# Patient Record
Sex: Male | Born: 1935
Health system: Southern US, Community
[De-identification: ages and names within clinical notes are randomized; demographics above are authoritative.]

## PROBLEM LIST (undated history)

## (undated) DIAGNOSIS — E785 Hyperlipidemia, unspecified: Secondary | ICD-10-CM

## (undated) DIAGNOSIS — E669 Obesity, unspecified: Secondary | ICD-10-CM

## (undated) DIAGNOSIS — I10 Essential (primary) hypertension: Secondary | ICD-10-CM

## (undated) DIAGNOSIS — I2510 Atherosclerotic heart disease of native coronary artery without angina pectoris: Secondary | ICD-10-CM

## (undated) DIAGNOSIS — E119 Type 2 diabetes mellitus without complications: Secondary | ICD-10-CM

## (undated) HISTORY — DX: Type 2 diabetes mellitus without complications: E11.9

## (undated) HISTORY — DX: Atherosclerotic heart disease of native coronary artery without angina pectoris: I25.10

## (undated) HISTORY — PX: OTHER SURGICAL HISTORY: SHX169

## (undated) HISTORY — PX: BACK SURGERY: SHX140

## (undated) HISTORY — DX: Obesity, unspecified: E66.9

## (undated) HISTORY — PX: BILATERAL KNEE ARTHROSCOPY: SUR91

## (undated) HISTORY — DX: Essential (primary) hypertension: I10

## (undated) HISTORY — DX: Hyperlipidemia, unspecified: E78.5

---

## 1998-02-24 ENCOUNTER — Encounter: Admission: RE | Admit: 1998-02-24 | Discharge: 1998-02-24 | Payer: Self-pay | Admitting: Family Medicine

## 1998-03-08 ENCOUNTER — Encounter: Admission: RE | Admit: 1998-03-08 | Discharge: 1998-03-08 | Payer: Self-pay | Admitting: Sports Medicine

## 1998-05-06 ENCOUNTER — Encounter: Admission: RE | Admit: 1998-05-06 | Discharge: 1998-05-06 | Payer: Self-pay | Admitting: Family Medicine

## 1998-05-10 ENCOUNTER — Encounter: Admission: RE | Admit: 1998-05-10 | Discharge: 1998-05-10 | Payer: Self-pay | Admitting: Family Medicine

## 2000-03-15 ENCOUNTER — Encounter: Admission: RE | Admit: 2000-03-15 | Discharge: 2000-03-15 | Payer: Self-pay | Admitting: Sports Medicine

## 2000-04-09 ENCOUNTER — Ambulatory Visit (HOSPITAL_COMMUNITY): Admission: RE | Admit: 2000-04-09 | Discharge: 2000-04-09 | Payer: Self-pay | Admitting: Sports Medicine

## 2000-10-19 ENCOUNTER — Encounter: Admission: RE | Admit: 2000-10-19 | Discharge: 2000-10-19 | Payer: Self-pay | Admitting: Sports Medicine

## 2001-04-03 ENCOUNTER — Encounter: Admission: RE | Admit: 2001-04-03 | Discharge: 2001-04-03 | Payer: Self-pay | Admitting: Family Medicine

## 2002-04-14 ENCOUNTER — Encounter: Admission: RE | Admit: 2002-04-14 | Discharge: 2002-04-14 | Payer: Self-pay | Admitting: Family Medicine

## 2002-05-13 ENCOUNTER — Encounter: Payer: Self-pay | Admitting: Sports Medicine

## 2002-05-13 ENCOUNTER — Encounter: Admission: RE | Admit: 2002-05-13 | Discharge: 2002-05-13 | Payer: Self-pay | Admitting: Sports Medicine

## 2002-06-18 ENCOUNTER — Encounter (INDEPENDENT_AMBULATORY_CARE_PROVIDER_SITE_OTHER): Payer: Self-pay | Admitting: Specialist

## 2002-06-18 ENCOUNTER — Ambulatory Visit (HOSPITAL_COMMUNITY): Admission: RE | Admit: 2002-06-18 | Discharge: 2002-06-18 | Payer: Self-pay | Admitting: Gastroenterology

## 2002-06-24 ENCOUNTER — Encounter: Admission: RE | Admit: 2002-06-24 | Discharge: 2002-06-24 | Payer: Self-pay | Admitting: Sports Medicine

## 2002-07-15 ENCOUNTER — Encounter: Admission: RE | Admit: 2002-07-15 | Discharge: 2002-07-15 | Payer: Self-pay | Admitting: Sports Medicine

## 2002-09-25 ENCOUNTER — Encounter: Admission: RE | Admit: 2002-09-25 | Discharge: 2002-09-25 | Payer: Self-pay | Admitting: Family Medicine

## 2003-02-17 ENCOUNTER — Encounter: Admission: RE | Admit: 2003-02-17 | Discharge: 2003-02-17 | Payer: Self-pay | Admitting: Sports Medicine

## 2003-02-26 ENCOUNTER — Encounter: Admission: RE | Admit: 2003-02-26 | Discharge: 2003-02-26 | Payer: Self-pay | Admitting: Sports Medicine

## 2003-03-03 ENCOUNTER — Encounter: Admission: RE | Admit: 2003-03-03 | Discharge: 2003-03-03 | Payer: Self-pay | Admitting: Family Medicine

## 2003-03-19 ENCOUNTER — Encounter: Payer: Self-pay | Admitting: Orthopedic Surgery

## 2003-03-23 ENCOUNTER — Encounter: Payer: Self-pay | Admitting: Orthopedic Surgery

## 2003-03-23 ENCOUNTER — Inpatient Hospital Stay (HOSPITAL_COMMUNITY): Admission: RE | Admit: 2003-03-23 | Discharge: 2003-03-28 | Payer: Self-pay | Admitting: Orthopedic Surgery

## 2003-03-28 ENCOUNTER — Inpatient Hospital Stay (HOSPITAL_COMMUNITY)
Admission: RE | Admit: 2003-03-28 | Discharge: 2003-04-07 | Payer: Self-pay | Admitting: Physical Medicine & Rehabilitation

## 2003-03-29 ENCOUNTER — Encounter: Payer: Self-pay | Admitting: Physical Medicine & Rehabilitation

## 2003-06-25 ENCOUNTER — Encounter: Admission: RE | Admit: 2003-06-25 | Discharge: 2003-06-25 | Payer: Self-pay | Admitting: Sports Medicine

## 2003-07-30 ENCOUNTER — Encounter: Admission: RE | Admit: 2003-07-30 | Discharge: 2003-07-30 | Payer: Self-pay | Admitting: Sports Medicine

## 2003-08-13 ENCOUNTER — Encounter: Admission: RE | Admit: 2003-08-13 | Discharge: 2003-08-13 | Payer: Self-pay | Admitting: Sports Medicine

## 2003-10-17 HISTORY — PX: CORONARY ANGIOPLASTY WITH STENT PLACEMENT: SHX49

## 2003-10-22 ENCOUNTER — Encounter: Admission: RE | Admit: 2003-10-22 | Discharge: 2003-10-22 | Payer: Self-pay | Admitting: Family Medicine

## 2004-01-28 ENCOUNTER — Encounter: Admission: RE | Admit: 2004-01-28 | Discharge: 2004-01-28 | Payer: Self-pay | Admitting: Sports Medicine

## 2004-03-18 ENCOUNTER — Encounter (INDEPENDENT_AMBULATORY_CARE_PROVIDER_SITE_OTHER): Payer: Self-pay | Admitting: Specialist

## 2004-03-18 ENCOUNTER — Ambulatory Visit (HOSPITAL_COMMUNITY): Admission: RE | Admit: 2004-03-18 | Discharge: 2004-03-18 | Payer: Self-pay | Admitting: General Surgery

## 2004-04-21 ENCOUNTER — Encounter: Admission: RE | Admit: 2004-04-21 | Discharge: 2004-04-21 | Payer: Self-pay | Admitting: Family Medicine

## 2004-07-28 ENCOUNTER — Ambulatory Visit: Payer: Self-pay | Admitting: Sports Medicine

## 2004-11-22 ENCOUNTER — Emergency Department (HOSPITAL_COMMUNITY): Admission: EM | Admit: 2004-11-22 | Discharge: 2004-11-22 | Payer: Self-pay | Admitting: Emergency Medicine

## 2004-11-23 ENCOUNTER — Ambulatory Visit: Payer: Self-pay | Admitting: Sports Medicine

## 2005-06-28 ENCOUNTER — Ambulatory Visit (HOSPITAL_COMMUNITY): Admission: RE | Admit: 2005-06-28 | Discharge: 2005-06-28 | Payer: Self-pay | Admitting: Gastroenterology

## 2005-08-10 ENCOUNTER — Ambulatory Visit: Payer: Self-pay | Admitting: Sports Medicine

## 2005-08-21 ENCOUNTER — Inpatient Hospital Stay (HOSPITAL_COMMUNITY): Admission: RE | Admit: 2005-08-21 | Discharge: 2005-08-22 | Payer: Self-pay | Admitting: *Deleted

## 2006-03-23 ENCOUNTER — Ambulatory Visit: Payer: Self-pay | Admitting: Family Medicine

## 2006-06-28 ENCOUNTER — Ambulatory Visit: Payer: Self-pay | Admitting: Family Medicine

## 2006-08-02 ENCOUNTER — Ambulatory Visit: Payer: Self-pay | Admitting: Family Medicine

## 2006-12-13 DIAGNOSIS — N400 Enlarged prostate without lower urinary tract symptoms: Secondary | ICD-10-CM | POA: Insufficient documentation

## 2006-12-13 DIAGNOSIS — C4490 Unspecified malignant neoplasm of skin, unspecified: Secondary | ICD-10-CM

## 2006-12-13 DIAGNOSIS — D126 Benign neoplasm of colon, unspecified: Secondary | ICD-10-CM

## 2006-12-13 DIAGNOSIS — M545 Low back pain: Secondary | ICD-10-CM

## 2006-12-13 DIAGNOSIS — I10 Essential (primary) hypertension: Secondary | ICD-10-CM | POA: Insufficient documentation

## 2006-12-13 DIAGNOSIS — M1710 Unilateral primary osteoarthritis, unspecified knee: Secondary | ICD-10-CM

## 2006-12-13 DIAGNOSIS — K649 Unspecified hemorrhoids: Secondary | ICD-10-CM | POA: Insufficient documentation

## 2007-08-21 ENCOUNTER — Ambulatory Visit: Payer: Self-pay | Admitting: Family Medicine

## 2007-12-09 ENCOUNTER — Encounter: Payer: Self-pay | Admitting: *Deleted

## 2007-12-26 ENCOUNTER — Ambulatory Visit: Payer: Self-pay | Admitting: Sports Medicine

## 2007-12-26 DIAGNOSIS — F4321 Adjustment disorder with depressed mood: Secondary | ICD-10-CM

## 2008-02-06 ENCOUNTER — Ambulatory Visit: Payer: Self-pay | Admitting: Sports Medicine

## 2008-02-06 DIAGNOSIS — I259 Chronic ischemic heart disease, unspecified: Secondary | ICD-10-CM | POA: Insufficient documentation

## 2008-05-21 ENCOUNTER — Ambulatory Visit: Payer: Self-pay | Admitting: Sports Medicine

## 2008-05-21 DIAGNOSIS — F528 Other sexual dysfunction not due to a substance or known physiological condition: Secondary | ICD-10-CM

## 2008-06-11 ENCOUNTER — Ambulatory Visit: Payer: Self-pay | Admitting: Sports Medicine

## 2008-06-11 ENCOUNTER — Encounter: Payer: Self-pay | Admitting: Family Medicine

## 2008-06-11 DIAGNOSIS — L570 Actinic keratosis: Secondary | ICD-10-CM | POA: Insufficient documentation

## 2008-08-05 ENCOUNTER — Encounter: Payer: Self-pay | Admitting: Family Medicine

## 2009-07-20 ENCOUNTER — Encounter: Payer: Self-pay | Admitting: *Deleted

## 2009-07-20 HISTORY — PX: CARDIOVASCULAR STRESS TEST: SHX262

## 2010-05-17 ENCOUNTER — Ambulatory Visit: Payer: Self-pay | Admitting: Cardiology

## 2010-06-07 ENCOUNTER — Ambulatory Visit: Payer: Self-pay | Admitting: *Deleted

## 2010-07-11 ENCOUNTER — Ambulatory Visit: Payer: Self-pay | Admitting: Cardiology

## 2010-08-29 ENCOUNTER — Ambulatory Visit: Payer: Self-pay | Admitting: Cardiology

## 2010-09-16 ENCOUNTER — Ambulatory Visit: Payer: Self-pay | Admitting: Family Medicine

## 2010-09-16 DIAGNOSIS — R7301 Impaired fasting glucose: Secondary | ICD-10-CM | POA: Insufficient documentation

## 2010-09-23 ENCOUNTER — Encounter: Payer: Self-pay | Admitting: Family Medicine

## 2010-09-23 ENCOUNTER — Ambulatory Visit: Payer: Self-pay | Admitting: Family Medicine

## 2010-09-23 LAB — CONVERTED CEMR LAB
BUN: 17 mg/dL (ref 6–23)
Chloride: 104 meq/L (ref 96–112)
Potassium: 4.1 meq/L (ref 3.5–5.3)

## 2010-09-26 ENCOUNTER — Encounter: Payer: Self-pay | Admitting: Family Medicine

## 2010-11-17 NOTE — Assessment & Plan Note (Signed)
Summary: back pain,df   Vital Signs:  Patient profile:   77 year old male Height:      72 inches Weight:      240 pounds BMI:     32.67 Temp:     98.6 degrees F oral Pulse rate:   70 / minute BP sitting:   164 / 79  (right arm)  Vitals Entered By: Tessie Fass CMA (September 16, 2010 10:12 AM) CC: lower back pain x 1 month Is Patient Diabetic? No Pain Assessment Patient in pain? yes     Location: lower back Intensity: 2   CC:  lower back pain x 1 month.  History of Present Illness:  Patient formerly of Dr Darrick Penna.  Here today with complaint of low back pain two month duration.  Started when he bent over to put on socks and shoes.  No radiation.  Has had disc herniation before, this does not feel the same. Not bad once he gets up from sitting to standing, or when laying down.  Has taken otc ibuprofen 200mg  tabs, 2 tabs alternating with tylenol for the past 2 days.  No significant relief.   Also of note, patient saw Dr Rudean Haskell and had labs done at [facility] on Aug 29, 2010. He shares the results with me today.  Of note, glucose 127, Total Chol 192, LDL 128, HDL 34, TG 153.  BUN 18 Cr 0.9.  AST 21 ALT 37 Alk Phos 76.    Patient has prior CAD diagnosis.  Has been on statin medication in the past, felt that it made him feel wheezy.  Is reticent to begin statins again, wonders if red yeast rice would work just as well.  Discussed use of non-regulated remedies that work similar to statin pharmacologically.   Denies chest pain or shortness of breath now.    Physical Exam  General:  Well appaering, no apparent distress.   Neck:  Neck suipple Lungs:  Normal respiratory effort, chest expands symmetrically. Lungs are clear to auscultation, no crackles or wheezes. Heart:  Normal rate and regular rhythm. S1 and S2 normal without gallop, murmur, click, rub or other extra sounds. Msk:  No point tenderness over low back, no CVA tendenress.  Healed midline scar in lumbar region.    Full ROM with SLR, also with full active ROM hips (flexion/ext, int ext rotation). Pulses:  Palpable dp pulses bilat.  Extremities:  Dry flaky feet.  Perhaps mild skin maceration between 4 and 5th digits on L foot.  Neurologic:  Sensatiokn in feet grossly intact bilaterally   Impression & Recommendations:  Problem # 1:  IMPAIRED FASTING GLUCOSE (ICD-790.21) Single fasting glucose of 127.  A1C 6.3%  Will draw another fasting glucose to determine if DM is appropriate diagnostic term.  In any event, discussed my recommendation that he be on a statin irrespective of DM diagnosis, given prior history of CAD.  Will continue to discuss after labs are back next week.  Orders: [facility]- Est  Level 4 ([address]214)Future Orders: Basic Met-[facility] ([address]) ... 09/29/2011  Problem # 2:  LUMBAGO (ICD-724.2)  Suspect musculoskeletal cause.  Possible OA as well. Discussed regimen of NSAID to be used ATC for the next 5 days or so, with food.  Gentle stretching.  Flexeril offered, declined.  Prescription for Lidoderm patch for pain.  I do not feel that Xray will advance our management plan for this problem at this point, consider if not better or changes in presentation.  His updated medication  list for this problem includes:    Anacin 81 Mg Tbec (Aspirin) .[name]... 1 by mouth qd  Orders: [facility]- Est  Level 4 ([address])  Complete Medication List: 1)  Metoprolol Tartrate 50 Mg Tabs (Metoprolol tartrate) .... Take 1 tablet by mouth twice a day 2)  Saw Palmetto 500 Mg Caps (Saw palmetto (serenoa repens)) .[name].. 1 by mouth qd 3)  Diazepam 2 Mg Tabs (Diazepam) .[name].. 1 or 2 @ hs as needed 4)  Anacin 81 Mg Tbec (Aspirin) .[name].. 1 by mouth qd 5)  Fish Oil 1200 Mg Caps (Omega-3 fatty acids) .[name].. 1 by mouth qd 6)  Norvasc 5 Mg Tabs (Amlodipine besylate) .[name].. 1 by mouth daily 7)  Lidoderm 5 % Ptch (Lidocaine) .... Sig apply to low back (up to 3 patches) for 12 hours/day as needed disp 1 box  Patient Instructions: 1)  FASTING LABS  NEXT FRIDAY DEC 9TH. 2)  PLEASE CALL BACK IF THE BACK PAIN IS NOT IMPROVING Prescriptions: LIDODERM 5 % PTCH (LIDOCAINE) SIG Apply to low back (up to 3 patches) for 12 hours/day as needed DISP 1 box  #1 x 1   Entered and Authorized by:   Paula Compton MD   Signed by:   Paula Compton MD on 09/16/2010   Method used:   Electronically to        [facility] (retail)       [address]       [place], [place]  [address]       Ph: [phone]       Fax: [phone]   RxID:   [id number]    Orders Added: 1)  Basic Met-[facility] [[id number]] 2)  [facility]- Est  Level 4 [[address]]

## 2010-11-17 NOTE — Letter (Signed)
Summary: Generic Letter  [facility]  [address]   [place], [place] [address]   Phone: [phone]  Fax: [phone]    09/26/2010  Colin Newman [address] [place], [place]  [address]  Dear Mr. Dohner,   It was a pleasure to see you in the office during your recent visit. I am writing with the results of your recent fasting labwork (results attached).  The fasting glucose was elevated at 140 (normal is less than 100).  This value, combined with the result from Dr. Ronnald Nian office in November, meets the criteria for diabetes mellitus.   I would like to see you in the office to discuss this lab finding.  At that time I would like to order an additional test, called a Hemoglobin A1C, that gives a better idea of glucose control over the preceding 3 to 4 months.  Please feel free to call with any questions.       Sincerely,   Paula Compton MD  Appended Document: Generic Letter mailed

## 2010-12-19 ENCOUNTER — Encounter: Payer: Self-pay | Admitting: Home Health Services

## 2011-02-08 ENCOUNTER — Other Ambulatory Visit: Payer: Self-pay | Admitting: Cardiology

## 2011-02-08 DIAGNOSIS — I2510 Atherosclerotic heart disease of native coronary artery without angina pectoris: Secondary | ICD-10-CM

## 2011-02-08 MED ORDER — NITROGLYCERIN 0.4 MG SL SUBL: 0.4000 mg | SUBLINGUAL_TABLET | SUBLINGUAL | Status: DC | PRN

## 2011-02-08 NOTE — Telephone Encounter (Signed)
Refilled Nitro.  Pt aware.

## 2011-03-03 NOTE — Cardiovascular Report (Signed)
NAMEFRANSICO, SCIANDRA NO.:  [id number]   MEDICAL RECORD NO.:  [id number]          PATIENT TYPE:  OIB   LOCATION:  6533                         FACILITY:  [facility]   PHYSICIAN:  Peter M. Swaziland, M.D.  DATE OF BIRTH:  05-24-1933   DATE OF PROCEDURE:  08/21/2004  DATE OF DISCHARGE:                              CARDIAC CATHETERIZATION   INDICATIONS FOR PROCEDURE:  The patient is a 77 year old white male who  presents with symptoms of typical angina progressing over the past few  weeks. The patient had diagnostic cardiac catheterization today by Dr.  Reyes Ivan which demonstrated critical single-vessel obstructive coronary  disease with a severe lesion in the proximal LAD. There is also segment of  modest disease in the LAD just proximal to the more severe lesion giving an  extent of lesion over approximately 20 mm. We elected to proceed with  intracoronary stenting of this vessel.   The patient had a 6-French arterial sheath in place. He was anticoagulated  with heparin with subsequent ACT of 264. He received double bolus of  Integrilin at 0.18 micrograms per kilogram per minute, followed by  continuous to the 0.18 milligrams/kilogram per minute with a continuous  effusion 0.2 milligrams/kilograms per minute. The patient also received a  loading dose of Plavix 600 milligrams p.o. He received nitroglycerin 200 mcg  intracoronary x2. Total contrast for intervention was 90 cc of Omnipaque. He  remained hemodynamically stable throughout.   EQUIPMENT:  6-French JL-4 guide, a 0.014 high-torque floppy extra support  wire, a 3.0 x 15 mm Maverick balloon, a 3.0 x 23 mm Vision stent and a 3.25  x 20 mm Quantum Maverick balloon.   After initial guide shots were obtained, we were able to cross the lesion  without much difficulty. The wire tended to select the large diagonal branch  and this was felt to be suitable. The lesion was predilated with the 3.0 x  15 mm Maverick balloon up  to 6 and then 10 atmospheres. This yielded an  improved angiographic appearance but there was significant plaque disruption  at the site of the lesion extending back into the more proximal plaque. We  then selected a 3.0 x 23 mm Vision stent and spanned that entire region of  plaque. This was deployed at 9 atmospheres and postdilated to 14 atmospheres  with the stent balloon. We then increased size using 3.25 x 20 mm Quantum  Maverick balloon and performed two inflations covering the extent of the  stent at 14 atmospheres each. At this  point, the patient had excellent angiographic result with 0% residual  stenosis and TIMI grade III flow. He was pain-free and hemodynamically  stable.   IMPRESSION:  Successful intracoronary stenting of the proximal LAD.           ______________________________  Peter M. Swaziland, M.D.     PMJ/MEDQ  D:  08/21/2005  T:  08/21/2005  Job:  [id number]   cc:   Elmore Guise., M.D.  Fax: [phone]   Colleen Can. Deborah Chalk, M.D.  Fax: [phone]

## 2011-03-03 NOTE — H&P (Signed)
NAMENEHAN, FLAUM NO.:  [id number]   MEDICAL RECORD NO.:  [id number]           PATIENT TYPE:   LOCATION:                               FACILITY:  [facility]   PHYSICIAN:  Elmore Guise., M.D.DATE OF BIRTH:  July 17, 1933   DATE OF ADMISSION:  DATE OF DISCHARGE:                                HISTORY & PHYSICAL   DATE OF CATHETERIZATION:  August 21, 2005.   INDICATION FOR CATHETERIZATION:  Increasing exertional chest pain.   HISTORY OF PRESENT ILLNESS:  The patient is a very pleasant, 77 year old,  white male past medical history of hypertension, borderline dyslipidemia,  and remote history of tobacco dependence, who presents for evaluation of off-  and-on chest pain.  The patient reports increasing retrosternal chest  tightness over the last couple of days.  It is worse when he walks up a  hill, climbs stairs  associated with shortness of breath and diaphoresis.  No nausea, no vomiting.  He says this pain goes away with rest.  He denies  any rest pain.  No cough, palpitations, no presyncope or syncope.  He did  have one episode of dizziness approximately 6 months ago when he stood up  too fast.  He is been on chronic medications and they have had no changes.  His weight has been stable.  He is having no indigestion after meals, no  bleeding or easy bruising.   REVIEW OF SYSTEMS:  Are otherwise negative.   His current medications are:  1.  Aspirin 81 mg daily.  2.  HCTZ 25 mg daily.  3.  Metoprolol 50 mg daily.  4.  Multivitamin once daily.   ALLERGIES:  None.   FAMILY HISTORY:  Positive for cancer and heart disease in his mother  starting in her early 71s.   SOCIAL HISTORY:  He is currently retired, does not exercise.  He has a past  medical has a past tobacco history of approximately 50-pack years, quit 20  years ago.  No alcohol.  Does drink two caffeinated beverages daily.   PAST SURGICAL HISTORY:  1.  Includes bilateral total knee  replacements.  2.  Two back surgeries.   PHYSICAL EXAMINATION:  VITAL SIGNS:  His weight is to 248 pounds, blood  pressure 120/80. His heart rate is 65 and regular.  GENERAL:  He is a very pleasant, middle-aged, white male, alert and oriented  x4, no acute distress.  HEENT:  Appear normal.  NECK:  Supple.  No lymphadenopathy Carotids 2+.  No JVD and no bruits.  LUNGS:  Clear.  HEART:  Regular with a normal S1-S2 and no significant murmurs, gallops, or  rubs.  ABDOMEN:  Soft, nontender, nondistended.  EXTREMITIES:  Are warm with 2+ pulses and no edema.  NEUROLOGIC:  Intact.  No focal deficits.   His most recent blood work show a PT of 12.9, INR 0.87, and PTT of 24.3.  He  had a BUN and creatinine 21 and 1.1, normal electrolytes.  He had a CBC  showing a white count of 7.7, hemoglobin of 16.1, and platelet count of  245.  His EKG, today, shows normal sinus rhythm, rate of 65 per minute, first-  degree AV block, with PR interval of 247 msec and no significant ST or T  wave changes.  Chest x-ray showed no acute cardiopulmonary disease.   IMPRESSION:  1.  Increasing exertional angina.  2.  Multiple cardiac risk factors.   PLAN:  At this time we will schedule the patient the patient for possible  cardiac catheterization with intervention.  This will be scheduled for  Monday, August 21, 2005.  His lab work was reviewed and is normal.  I have  asked him to take sublingual nitroglycerin over the weekend should he have  any further pain and to take it easy.  Should he have any unrelieved  symptoms, he is to go to the ER for further evaluation.      Elmore Guise., M.D.  Electronically Signed     TWK/MEDQ  D:  08/18/2005  T:  08/18/2005  Job:  [id number]

## 2011-03-03 NOTE — Op Note (Signed)
NAME:  Colin Newman, Colin Newman               ACCOUNT NO.:  [id number]   MEDICAL RECORD NO.:  [id number]          PATIENT TYPE:  AMB   LOCATION:  ENDO                         FACILITY:  [facility]   PHYSICIAN:  Anselmo Rod, M.D.  DATE OF BIRTH:  24-Aug-1933   DATE OF PROCEDURE:  06/28/2005  DATE OF DISCHARGE:                                 OPERATIVE REPORT   PROCEDURE PERFORMED:  Screening colonoscopy.   ENDOSCOPIST:  Charna Elizabeth, M.D.   INSTRUMENT USED:  Olympus video colonoscope.   INDICATIONS FOR PROCEDURE:  The patient is a 77 year old white male with a  history of adenomatous polyps, chronic constipation and rectal bleeding with  occasional rectal pain.  Rule out recurrent polyps, masses, etc.  The  patient has a history of hemorrhoids and had a hemorrhoidectomy in the past.   PREPROCEDURE PREPARATION:  Informed consent was procured from the patient.  The patient was fasted for eight hours prior to the procedure and prepped  with a bottle of magnesium citrate and a gallon of GoLYTELY the night prior  to the procedure.  The risks and benefits of the procedure including a 10%  miss rate for colon polyps or cancers was discussed with the patient as  well.   PREPROCEDURE PHYSICAL:  The patient had stable vital signs.  Neck supple.  Chest clear to auscultation.  S1 and S2 regular.  Abdomen soft with normal  bowel sounds.   DESCRIPTION OF PROCEDURE:  The patient was placed in left lateral decubitus  position and sedated with 60 mg of Demerol and 5 mg of Versed in slow  incremental doses.  Once the patient was adequately sedated and maintained  on low flow oxygen and continuous cardiac monitoring, the Olympus video  colonoscope was advanced from the rectum to the cecum without difficulty.  There was a large amount of residual stool in the colon.  Visualization was  suboptimal.  Multiple washes were done.  There was a large amount of stool  in the cecum as well and therefore the cecal base  was not clearly  visualized.  There was a large anal fissure noted on anal inspection which I  suspect was the source of the patient's rectal bleeding.  No masses or  polyps were seen.  There was no evidence of diverticulosis.  The patient  tolerated the procedure well without immediate complication.   IMPRESSION:  1.  Large anal fissure.  2.  Large amount of residual stool in the colon, small lesions could have      been misses.  No masses or polyps seen.  No evidence of diverticulosis.   RECOMMENDATIONS:  1.  Continue Glycolax for constipation as needed.  2.  Cardizem gel 2% has been advised for local application to help with      healing of the anal fissure.  If this does      not help, a sphincterotomy will be considered.  3.  Outpatient followup as need arises in the future.  4.  Repeat colonoscopy in the next five years unless the patient develops      any  abnormal symptoms in the interim.      Anselmo Rod, M.D.  Electronically Signed     JNM/MEDQ  D:  06/28/2005  T:  06/28/2005  Job:  [id number]   cc:   Sibyl Parr. Darrick Penna, M.D.  Fax: [phone]

## 2011-03-03 NOTE — Op Note (Signed)
NAME:  Colin Newman, Colin Newman                         ACCOUNT NO.:  [id number]   MEDICAL RECORD NO.:  [id number]                   PATIENT TYPE:  AMB   LOCATION:  DAY                                  FACILITY:  [facility]   PHYSICIAN:  Timothy E. Earlene Plater, M.D.              DATE OF BIRTH:  Nov 24, 1932   DATE OF PROCEDURE:  03/18/2004  DATE OF DISCHARGE:                                 OPERATIVE REPORT   PREOPERATIVE DIAGNOSIS:  External hemorrhoids.   POSTOPERATIVE DIAGNOSIS:  External hemorrhoids.   PROCEDURE:  External hemorrhoidectomy.   SURGEON:  Timothy E. Earlene Plater, M.D.   ANESTHESIA:  Local standby.   INDICATIONS FOR PROCEDURE:  Mr. Mayotte is 77 year old man who has trouble  with hemorrhoids, with some protrusion and bleeding.  He has been treated in  the office with good control of internal hemorrhoids.  Because of external  hemorrhoids and subsequent irritation and pruritus ani, he wishes to have  these external hemorrhoids removed.  This has been carefully discussed.  Colonoscopy is current.  The patient was identified and the permit signed.  Evaluated by anesthesia.  His chest x-ray was clear.  His cardiogram showed  normal, except for first-degree block.   DESCRIPTION OF PROCEDURE:  He was taken to the operating room and placed  supine.  IV sedation given.  He was placed in the lithotomy position and  gently raised so that the anal area was exposed.  Hemorrhoids were present,  left lateral and right posterior.  These areas were injected with 0.25%  Marcaine with epinephrine with Wydase, and massaged in well.  The external  hemorrhoids were simply excised, and nomadic superficial varicosities  removed.  The subsequent wounds were closed with either interrupted or  running 3-0 chromic.  The results were satisfactory and no bleeding.  Sphincter remained intact.  Gelfoam gauze and a dry sterile dressing  applied.  He tolerated the procedure well and was removed to the recovery  room in  good condition.   DISPOSITION:  Written and verbal instructions were given to he and his wife.  He will be followed as an outpatient.                                               Timothy E. Earlene Plater, M.D.    TED/MEDQ  D:  03/18/2004  T:  03/18/2004  Job:  [id number]   cc:   Sibyl Parr. Darrick Penna, M.D.  Fax: [phone]

## 2011-03-03 NOTE — Discharge Summary (Signed)
NAME:  Colin Newman, Colin Newman                         ACCOUNT NO.:  [id number]   MEDICAL RECORD NO.:  [id number]                   PATIENT TYPE:  IPS   LOCATION:  4140                                 FACILITY:  [facility]   PHYSICIAN:  Erick Colace, M.D.           DATE OF BIRTH:  [birth year]   DATE OF ADMISSION:  03/28/2003  DATE OF DISCHARGE:  04/07/2003                                 DISCHARGE SUMMARY   DISCHARGE DIAGNOSES:  1. Bilateral total knee replacement.  2. Postoperative anemia.  3. Hypertension.   HISTORY OF PRESENT ILLNESS:  Colin Newman is a 77 year old man with history of  BPH, DDD, end-stage DJD of bilateral knees who elected to undergo bilateral  total knee replacement on June 7, by Dr. Turner Daniels.   Postoperatively, he is weightbearing as tolerated and on Coumadin for DVT  prophylaxis.  He has had some issues with postoperative anemia and received  two units of autologous packed red blood cells.  He has had problems with  other issues including problems with decreased endurance and constipation.  Physical therapy has been ongoing and the patient is moderate assistance for  transfers, moderate assistance ambulating 15 feet with standard walker.  He  is requiring supervision for bathing.  Total assistance for lower body  dressing.   PAST MEDICAL HISTORY:  1. Benign prostatic hypertrophy with hesitancy.  2. Recently elevated blood pressure.  3. Degenerative joint disease.  4. Degenerative disk disease with history of back surgery.   ALLERGIES:  No known drug allergies.   SOCIAL HISTORY:  The patient is married, lives in an one level home with two  steps at entry.  He is retired but independent prior to admission.  He does  not use any tobacco or alcohol.   HOSPITAL COURSE:  Colin Newman was admitted to rehabilitation on March 28, 2003, for inpatient therapies to consist of PT/OT daily.  Postadmission,  he was maintained on Coumadin for DVT prophylaxis.  Pain  control has been  reasonable with use of p.r.n. medications.  His bilateral knee incisions  have healed well without any signs or symptoms of infection or drainage and  no erythema noted.   LABORATORY DATA:  Post admission showed H&H 9.9/26.7.  Check of electrolytes  showed sodium 138, potassium 4.2, chloride 103, CO2 28, BUN 19, creatinine  1, glucose 146.  Mild elevation of LFTs with AST 39, ALT 71.  An UA/UC done  showed multispecies without any uropathogens.   The patient was noted to have some problems with cough and wheezing and  chest x-ray done on June 13, was negative for active disease.  He was  treated with nebulizers for a few days and respiratory status has been  stable.   The patient's staples were discontinued and the area Steri-Stripped on  postoperative day #13 without any difficulty.  During his stay in  rehabilitation, Colin Newman progressed to being modified  independent for bed  mobility, modified independent for transfers, modified independent for  ambulating 200 feet with a rolling walker.  He was able to navigate two  stairs with a rolling walker at modified independent level.  He is modified  independent with assistive device for ADLs, modified independent for  toileting.  Home health therapies to include safety evaluation  postdischarge.   DISCHARGE MEDICATIONS:  1. Coumadin 5 mg 1-1/2 p.o. q.p.m.  2. Trinsicon one p.o. b.i.d.  3. Oxycodone IR 5-10 mg q.4-6 h. p.r.n. pain.  4. Tylenol 325 mg 1-2 q.4-6 h. p.r.n. pain.  5. Altace 2.5 mg b.i.d.   ACTIVITY:  Continue using walker.   DIET:  Regular.   WOUND CARE:  Keep area clean and dry.   SPECIAL INSTRUCTIONS:  No alcohol, no smoking, no driving.  Avoid aspirin  and aspirin-containing products.  Home health RN to draw [employer] June 25.   FOLLOWUP:  The patient is to follow up with Dr. Turner Daniels for an appointment in  2-3 weeks.  Follow up with Dr. Wynn Banker as needed.  Follow up with Dr.  Darrick Penna for blood  pressure check in the next couple of weeks.     Evlyn Kanner., [place], P.A.-C              Erick Colace, M.D.    PSP/MEDQ  D:  04/07/2003  T:  04/08/2003  Job:  [id number]   cc:   Sibyl Parr. Fields, M.D.  [address] [place]  [place] [address]  Fax: [phone]   Feliberto Gottron. Turner Daniels, M.D.  [address]  [place]  [place] [address]  Fax: [phone]    cc:   Sibyl Parr. Fields, M.D.  [address] [place]  [place] [address]  Fax: [phone]   Feliberto Gottron. Turner Daniels, M.D.  [address]  [place]  [place] [address]  Fax: [phone]

## 2011-03-03 NOTE — Discharge Summary (Signed)
NAME:  MAEL, DELAP                         ACCOUNT NO.:  [id number]   MEDICAL RECORD NO.:  [id number]                   PATIENT TYPE:  INP   LOCATION:  5024                                 FACILITY:  [facility]   PHYSICIAN:  Feliberto Gottron. Turner Daniels, M.D.                DATE OF BIRTH:  1932/11/27   DATE OF ADMISSION:  03/23/2003  DATE OF DISCHARGE:  03/28/2003                                 DISCHARGE SUMMARY   PRIMARY DIAGNOSIS:  End-stage degenerative joint disease of both knees.   PROCEDURE:  Bilateral total knee arthroplasty.   BRIEF HISTORY:  The patient is a 77 year old gentleman with end-stage  arthritis of both knees with erosive chondromalacia to the medial  compartment and patellofemoral compartment of both knees with at least five  degrees of varus deformity of each knee.  The patient has failed  conservative treatment with anti-inflammatory medication, physical therapy,  cortisone injections, and has bilateral 10-15 degree flexion contractures  with resultant quad weakness and desires elective bilateral total knee  arthroplasties to decrease pain and increase function.  Expect a slower  recovery time having both done at the same time versus a staged left and  right total knee in exchange for decrease in having two surgeries.   ALLERGIES:  The patient has no known drug allergies.   MEDICATIONS:  Currently takes no medications preoperatively.   PAST MEDICAL HISTORY:  Significant for hypertension.   PAST SURGICAL HISTORY:  Disk surgery in the 1970s of her lower back.   FAMILY HISTORY:  Positive for blood clots in his mother.  Father deceased  due to melanoma.   REVIEW OF SYSTEMS:  Denies shortness of breath, chest pain, dyspnea.  No  nausea, vomiting, diarrhea.  No changes in vision.  No weight changes.  No  change in bowel or bladder function.   PHYSICAL EXAMINATION:  VITAL SIGNS:  The patient is afebrile.  Pulse 84,  respirations 20, blood pressure 150/68.  HEENT:  Head  is atraumatic.  Ears:  TM's are clear.  Eyes:  Pupils equal,  round, and reactive to light and accommodation.  Nose is benign.  Throat is  patent.  Gums reveal edentulous gums without erosions.  NECK:  Supple.  Full range of motion.  CHEST:  Clear to auscultation and percussion.  HEART:  Regular rate and rhythm.  ABDOMEN:  Soft, nontender.  EXTREMITIES:  Bilateral knee effusions, decreased range of motion with range  of motion as mentioned in the HPI.  Neurovascularly intact.   LABORATORY DATA:  Preoperative labs including a CBC, CMET, chest x-ray, EKG,  PT, and PTT are all within normal limits with the exception of EKG which  shows some nonspecific ST changes, a hematocrit of 38.9, and an ALT of 47.   HOSPITAL COURSE:  On the date of admission, the patient was taken to the  operating room at [facility] where he underwent bilateral  total knee  arthroplasties using Osteonics Scorpio implants.  All components were  cemented with Palacos cement, using implants of bilateral #11 femurs, #11  tibias, #9 patellae, with 10-mm PS spacers.  The patient was placed on  perioperative antibiotics for the first 48 hours.  He was placed on  postoperative Coumadin prophylaxis per pharmacy protocol with a target INR  of 1.5-2.  He was placed on PCA for pain control and CPM was begun the day  of surgery.   The first postoperative day, the patient was awake and alert in moderate  pain controlled well with PCA.  No nausea or vomiting.  Vitals signs were  stable.  He was afebrile.  Hemoglobin 10.7, PT of 15.3.  Hemovac output 150  right and 100 left, otherwise stable.  Continue with physical therapy and  OT.   On postoperative day #2--awake, alert, in moderate pain.  Up in chair x3  hours the previous day, T-max 100.7, dressings clean and dry, hemoglobin 8,  drain output less than 25 mL and discontinued, otherwise stable.  Because of  the patient's hemorrhagic anemia, the patient was transfused with 2  units of  autologous blood and continued with physical therapy.   He was seen by the physicians of rehab medicine and it was felt that he  would be a good candidate to go to rehab for several days after his hospital  stay.  Postoperative day #3, the patient was complaining of moderate pain  still, was progressing slowly in physical therapy, T-max 100.1 ranging to  98.6, hemoglobin 8.7 after 2 units of autologous, INR 2.1.  Wounds were  benign.  Calves were soft.  Otherwise, neurovascularly intact and stable.  PCA was discontinued.   On postoperative day #4, the patient complained of constipation, otherwise  without complaint.  Hemoglobin 8.7, T-max 99.  Wounds were benign, otherwise  stable, still awaiting rehab transfer.  Postoperative day #5, the patient  remained orthopedically stable, afebrile, hemoglobin stable.  Still was  having some difficulty with constipation.  A rehab bed became available and  he was transferred to their care.  At the time of discharge, he was well  controlled with oral pain medications, making slow but steady progress in  physical therapy with total knee precautions bilaterally.   He will return to see [place] in 1-2 weeks' time follow his discharge from rehab.  We will continue to follow him during his rehab stay.  He continues on the  Coumadin at the time of his transfer.     Laural Benes. Jannet Mantis.                     Feliberto Gottron. Turner Daniels, M.D.    Merita Norton  D:  04/29/2003  T:  04/30/2003  Job:  [id number]

## 2011-03-03 NOTE — Discharge Summary (Signed)
NAMEERICKSON, Colin Newman               ACCOUNT NO.:  [id number]   MEDICAL RECORD NO.:  [id number]          PATIENT TYPE:  INP   LOCATION:  6533                         FACILITY:  [facility]   PHYSICIAN:  Elmore Guise., M.D.DATE OF BIRTH:  1933/02/24   DATE OF ADMISSION:  08/21/2005  DATE OF DISCHARGE:  08/22/2005                                 DISCHARGE SUMMARY   DISCHARGE DIAGNOSIS:  1.  History of single-vessel obstructive coronary disease.  2.  Successful percutaneous coronary intervention of ostial and proximal      left anterior descending.  3.  Hypertension  4.  Dyslipidemia.   HISTORY OF PRESENT ILLNESS:  The patient is a 77 year old white male with  hypertension and dyslipidemia who was admitted with increasing exertional  chest pain.   HOSPITAL COURSE:  The patient underwent cardiac catheterization on  November6,2006, which showed a proximal 90-95% stenosis in his LAD. He  underwent successful intervention with bare metal stent placement to this  area with no significant residual stenosis. Post procedure he did well. He  had no further chest pain. He has now been up and ambulatory without any  significant symptoms. He will be discharged home on the following  medications.  1.  Aspirin 325 mg daily.  2.  Hydrochlorothiazide 25 mg daily.  3.  Metoprolol 50 mg twice daily.  4.  Saw palmetto as before.  5.  Multivitamin once daily.  6.  Stool softener p.r.n.  7.  Plavix 75 mg daily (new medication).  8.  Crestor 10 mg daily (new medication).  9.  Nitroglycerin 0.4 mg on a p.r.n. basis.   He will follow up with Dr. Reyes Ivan at [facility] in 2 weeks.   His post catheterization restrictions should be no strenuous activity or  heavy lifting for the next 48 hours and then slowly resuming back to normal  activity as tolerated. He will notify the office with any further questions  or concerns.      Elmore Guise., M.D.  Electronically Signed     TWK/MEDQ   D:  08/22/2005  T:  08/22/2005  Job:  [id number]

## 2011-03-03 NOTE — Op Note (Signed)
NAME:  Colin Newman, Colin Newman                         ACCOUNT NO.:  [id number]   MEDICAL RECORD NO.:  [id number]                   PATIENT TYPE:  INP   LOCATION:  2891                                 FACILITY:  [facility]   PHYSICIAN:  Feliberto Gottron. Turner Daniels, M.D.                DATE OF BIRTH:  September 01, 1933   DATE OF PROCEDURE:  03/23/2003  DATE OF DISCHARGE:                                 OPERATIVE REPORT   PREOPERATIVE DIAGNOSIS:  Degenerative arthritis of both knees, end stage.   POSTOPERATIVE DIAGNOSIS:  Degenerative arthritis of both knees, end stage.   PROCEDURE:  Bilateral total knee arthroplasties using Osteonics Scorpio  implants. All components were cemented with Palacos cement with Zinacef.  Both knees had #11 femurs, #11 tibias, #9 patella and #10 PS spacers.   SURGEON:  Feliberto Gottron. Turner Daniels, M.D.   FIRST ASSISTANT:  Harriette Ohara, PA-C.   ANESTHESIA:  General endotracheal anesthesia.   ESTIMATED BLOOD LOSS:  200 mL of the left knee which had the tourniquet let  down while we were doing the right knee.   TOURNIQUET TIME:  Total tourniquet time on the left was 1 hour and 37  minutes and on the right was 1 hour and 30 minutes.   URINE OUTPUT:  300 mL.   FLUIDS REPLACED:  2,000 mL of crystalloid.   INDICATIONS FOR PROCEDURE:  The patient is a 77 year old gentleman with end  stage arthritis of both knees with erosive chondromalacia to the medial  compartment and patellofemoral compartment of both knees with at least five  degrees of varus deformity to each knee.  He has failed conservative  treatment with anti-inflammatory medication, physical therapy, cortisone  injections and has bilateral 10 to 15 flexion degree contractures with  resulting quad weakness.  He desires elective bilateral total knee  arthroplasties to decrease pain, increase function, accepts the slower  increase in recovery of having them both done at the same time versus a  staged left total knee and right total knee  in exchange for decreased rehab  time.   DESCRIPTION OF PROCEDURE:  The patient was brought to the operating room at  [facility] main hospital.  The appropriate anesthetic monitors were  attached and general endotracheal anesthesia was induced with the patient in  the supine position. Tourniquets were applied high to both thighs and both  lower extremities were then prepped and draped in the usual sterile fashion  from the ankle to the tourniquet level.  Lateral posts were applied to the  table as well as foot positioners and we began the procedure on the left  side by wrapping the limb with an Esmarch bandage, flexing the knee and  inflating the tourniquet to 350 mmHg.  An 18 cm incision centered over the  patella anterior longitudinal in nature was then made through the skin and  subcutaneous tissue followed by a medial parapatellar arthrotomy.  The  patella was everted.  The prepatellar fat pad was resected with  electrocautery.  The superficial medial collateral ligament was elevated  from anterior to posterior to the level of the semi-membranous insertion  allowing removal of osteophytes from the medial aspect of the tibia.  The  patella was everted, the knee was flexed and we then removed osteophytes  from around the femur as well and these were quite large in size, some of  them being up to an inch in size as well as large osteophytes to the  patella.  Some actually required an osteotome for removal.  With the knee  hyperflexed and the patella everted we then instrumented the proximal tibia  with the Osteonics step drill followed by the intramedullary rod and a  posterior slope cutting guide.  The guide was pinned allowing a resection of  3 to 4 mm of bone medially and about a cm of bone laterally and the  posterior structures were protected with a posterior medial Z retractor, a  Nickell retractor through the notch and a lateral Homan.  Satisfied with the  proximal tibia  resection we then entered the distal femur with a Step drill  10 mm anterior to the PCL origin with the intramedullary rod and a 5 degree  left distal femoral cutting guide allowing a 10 mm resection from the high  point.  The resection was accomplished without difficulty.  We measured for  an 11 femoral component and externally rotated the cutting guide 3 degrees  to allow for the varus deformity.  We then performed our anterior, posterior  and chamfer cuts without difficulty followed by the Scorpio box cut.  The  everted patella was then grasped with a patellar cutting guide and the  posterior 10 to 11 mm of the relatively thick patella was resected, sized  for a #9 patellar button and drilled. We then brought up a #11 left trial  femoral component and a #11 tibial base plate and a 10 trial spacer and  inserted these components and checked for stability.  The patient did come  to full extension with a slight amount of hyperextension.  The collateral  ligaments were good and snug and range of motion was 0 to about 130 degrees  with the patella tracking in relatively normal fashion.  At this point the  trial components were removed after setting the rotation of the tibia to the  second metatarsal.  The tibial base plate was then pinned and the Delta fit  Kiel punches applied up to a 911 cemented punch.  At this point all bony  surfaces were Water-Pik cleaned and dried with suction and sponges.  A  double batch of Palacos polymethylmethacrylate cement with 1500 mg of  Zinacef mixed into the cement was then prepared, applied to all bony  metallic surfaces except for the posterior condyles of the femur itself.  In  order we then hammered into place a #11 tibial base plate, an 11 left  femoral component and removed excess cement.  The patellar button was then  clamped into place and excess cement removed.  The 10 mm PS spacer was then hammered into the tibial tray, the knee reduced and taken  through a range of  motion from 0 to about 130 degrees and then held in compression at 5 degrees  of flexion while it cured.  Medium Hemovac drains were placed deep in the  wounds.  The parapatellar arthrotomy was then closed with running #  1 Vicryl  suture after once again Water-Piking the wound clean.  The subcutaneous  tissue was closed with 0 and 2-0 undyed Vicryl suture and the skin with skin  staples. A dressing of Xeroform, 4 x 4 dressing sponges, Webril and an ace  wrap were applied.  The tourniquet was let down and we then directed our  attention to the right knee.  This was wrapped with an Esmarch bandage, the  tourniquet inflated to 350 mmHg with the knee bent and an 18 cm anterior  skin incision made centered over the patella allowing a medial parapatellar  arthrotomy.  Preparation of the tibia and femur went along exactly the same  as it did on the left side and the patient was set for an 11 tibia, an 11  femur and a 9 modified medial patellar button.  After the trial was  performed confirming good motion and good bony section a double batch of  Palacos polymethylmethacrylate cement was prepared for the right side.  The  components were cemented in place, the cement allowed to cure and the knee  taken through a range of motion and stability noted to be excellent.  A  similar closure was accomplished on the right side as well as a similar  dressing.  The tourniquet was then let down.  The patient was awakened and  taken to the recovery room without difficulty.                                               Feliberto Gottron. Turner Daniels, M.D.    Ovid Curd  D:  03/23/2003  T:  03/23/2003  Job:  [id number]

## 2011-03-03 NOTE — Op Note (Signed)
NAME:  Colin Newman, Colin Newman                         ACCOUNT NO.:  [id number]   MEDICAL RECORD NO.:  [id number]                   PATIENT TYPE:  AMB   LOCATION:  ENDO                                 FACILITY:  [facility]   PHYSICIAN:  Charna Elizabeth, M.D.                   DATE OF BIRTH:  Aug 14, 1933   DATE OF PROCEDURE:  06/18/2002  DATE OF DISCHARGE:                                 OPERATIVE REPORT   PROCEDURE PERFORMED:  Colonoscopy with snare polypectomy x one.   ENDOSCOPIST:  Charna Elizabeth, M.D.   INSTRUMENT USED:  Olympus video colonoscope.   INDICATIONS OF PROCEDURE:  Rectal bleeding and change in bowel habits with  worsening constipation in a 77 year old white male, rule out colonic polyps,  masses, hemorrhoids, etc.   PREPROCEDURE PREPARATION:  Informed consent was procured from the patient.  The patient fasted for eight hours prior to the procedure and prepped with a  bottle of magnesium citrate and a gallon of NuLytely the night prior to the  procedure.   PREPROCEDURE PHYSICAL:  The patient had stable vital signs.  Neck supple.  Chest clear to auscultation.  S1 and S2 regular.  Abdomen soft with normal  bowel sounds.   DESCRIPTION OF PROCEDURE:  The patient was placed in the left lateral  decubitus position and sedated with 60 mg of Demerol and 6 mg of Versed  intravenously.  Once the patient was adequately sedated and maintained on  low-flow oxygen and continuous cardiac monitoring, the Olympus video  colonoscope was advanced from the rectum to the rectum to the cecum without  difficulty.  A small sessile polyp was snared from the proximal right colon.  No other abnormalities were noted.  No masses, polyps, erosions, ulcers or  diverticula were present.  The procedure was continued and the appendiceal  orifice and the ileocecal valve were clearly visualized and photographed.  Small, nonbleeding internal hemorrhoids and small external hemorrhoids were  seen on retroflexion on anal  inspection.  The patient tolerated the  procedure well without complications.   IMPRESSION:  1. Small sessile polyp, snared from proximal right colon.  2. Small, nonbleeding internal and small external hemorrhoids.  3. No large masses or polyps seen.   RECOMMENDATIONS:  1. Avoid all nonsteroidals including aspirin for now.  2. Await pathology results.  3.     High-fiber diet with liberal fluid intake.  4. Sitz baths with local steroid application for symptomatic relief of     hemorrhoids.  5. Outpatient followup in the next two weeks.                                               Charna Elizabeth, M.D.    JM/MEDQ  D:  06/18/2002  T:  06/19/2002  Job:  [id number]   cc:   Sibyl Parr. Fields, M.D.  [address] [place]  [place] [address]  Fax: [phone]

## 2011-03-03 NOTE — Cardiovascular Report (Signed)
Colin Newman, WICKENS NO.:  [id number]   MEDICAL RECORD NO.:  [id number]          PATIENT TYPE:  OIB   LOCATION:  2899                         FACILITY:  [facility]   PHYSICIAN:  Elmore Guise., M.D.DATE OF BIRTH:  03-07-1933   DATE OF PROCEDURE:  08/21/2005  DATE OF DISCHARGE:                              CARDIAC CATHETERIZATION   INDICATION FOR PROCEDURE:  Increasing exertional angina.   PROCEDURE DESCRIPTION:  The patient was brought to the cardiac  catheterization laboratory after appropriate informed consent. He is prepped  and draped in sterile fashion. Approximately 20 cc of 1% lidocaine was used  for local anesthesia. A 6-French sheath was placed in the right femoral  without difficulty. Coronary angiography, LV angiography were then performed  without difficulty. The sheath was left in place for elective intervention  of the proximal LAD.   FINDINGS:  1.  Left main:  Mild proximal tapering approximately 20-30%.  2.  LAD:  Ostial 40% stenosis, followed by proximal 90-95% stenosis, Mid 40%      stenosis after bifurcation with D1, distal vessel had mild luminal      irregularities.  3.  D1:  Mild luminal irregularities.  4.  LCX:  Large vessel, nondominant, mild luminal irregularities.  5.  Ramus:  Mild luminal irregularities.  6.  RCA:  Dominant, large vessel with mild to moderate luminal      irregularities. Distal vessel giving right-to-left collaterals noted to      the proximal and distal LAD.  7.  LV:  EF is 55%. No wall motion abnormalities. LVEDP is 16 mmHg.   IMPRESSION:  1.  Single-vessel obstructive coronary artery disease with 90-95% proximal      left anterior descending coronary artery stenosis.  2.  Preserved left ventricular systolic function with an ejection fraction      of 55-60%.   PLAN:  Elective PCI of LAD, aggressive risk factor modification.      Elmore Guise., M.D.  Electronically Signed     TWK/MEDQ  D:   08/21/2005  T:  08/21/2005  Job:  [id number]   cc:   Colleen Can. Deborah Chalk, M.D.  Fax: [phone]

## 2011-06-22 ENCOUNTER — Telehealth: Payer: Self-pay | Admitting: Cardiology

## 2011-06-22 NOTE — Telephone Encounter (Signed)
Called returning your phone call. Please call back.

## 2011-06-23 ENCOUNTER — Encounter: Payer: Self-pay | Admitting: Family Medicine

## 2011-06-23 ENCOUNTER — Ambulatory Visit (INDEPENDENT_AMBULATORY_CARE_PROVIDER_SITE_OTHER): Payer: Medicare Other | Admitting: Family Medicine

## 2011-06-23 VITALS — BP 144/80 | HR 64 | Ht 72.0 in | Wt 243.0 lb

## 2011-06-23 DIAGNOSIS — E119 Type 2 diabetes mellitus without complications: Secondary | ICD-10-CM

## 2011-06-23 DIAGNOSIS — I10 Essential (primary) hypertension: Secondary | ICD-10-CM

## 2011-06-23 LAB — BASIC METABOLIC PANEL
Chloride: 105 mEq/L (ref 96–112)
Potassium: 4.5 mEq/L (ref 3.5–5.3)
Sodium: 140 mEq/L (ref 135–145)

## 2011-06-23 LAB — LIPID PANEL
Cholesterol: 225 mg/dL — ABNORMAL HIGH (ref 0–200)
HDL: 38 mg/dL — ABNORMAL LOW (ref >39)
Total CHOL/HDL Ratio: 5.9 Ratio
Triglycerides: 170 mg/dL — ABNORMAL HIGH (ref <150)
VLDL: 34 mg/dL (ref 0–40)

## 2011-06-23 MED ORDER — GLUCOSE BLOOD VI STRP: ORAL_STRIP | Status: AC

## 2011-06-23 MED ORDER — LISINOPRIL 20 MG PO TABS: 20.0000 mg | ORAL_TABLET | Freq: Every day | ORAL | Status: DC

## 2011-06-23 NOTE — Progress Notes (Signed)
  Subjective:    Patient ID: Colin Newman, male    DOB: 02/03/1933, 77 y.o.   MRN: [id number]  HPI Patient seen today for follow up on elevated fasting glucoses in November and December 2011.  He had a fasting glucose 127 with Dr Rudean Haskell on November 14, then a glucose 140 with me in December.  He reports feeling well, no complaints.  Since receiving this information by letter, he has been trying to reduce amount of sweets and eat at home more; no changes in weight.  Denies chest pain or dyspnea, no edema, has not had acute illness.   History PCI in the past.  Family Hx negative for diabetes mellitus.   Social Hx: widower  For past 4-5 years. Wife had been primary food preparer; after her death, he began to eat out more and take less care of diet.  Walks 2 miles daily.  Has eliminated fast food in the past few months.     Review of Systems see HPI     Objective:   Physical Exam  Constitutional: He appears well-developed and well-nourished. No distress.  HENT:  Mouth/Throat: Oropharynx is clear and moist. No oropharyngeal exudate.  Neck: No thyromegaly present.  Cardiovascular: Normal rate, regular rhythm and normal heart sounds.        Palpable DP pulses in both feet bilat.  Pulmonary/Chest: Effort normal and breath sounds normal. No respiratory distress. He has no wheezes. He has no rales. He exhibits no tenderness.  Musculoskeletal: He exhibits no edema and no tenderness.  Lymphadenopathy:    He has no cervical adenopathy.  Neurological:       Sensation in both feet grossly normal and symmetric.  No lesions on feet, no interdigitary maceration or skin breakdown.  Skin: He is not diaphoretic.          Assessment & Plan:

## 2011-06-23 NOTE — Assessment & Plan Note (Signed)
Patient with newly diagnosed DM2, BP not to goal.  Is highly adverse to adding medications to his metoprolol, which he takes twice daily.  He agrees to try adding lisinopril 20mg  one time daily to his regimen.  Checking BMet today, will recheck K and renal function after addition of ACEI.  Has appointment with his cardiologist Dr [place] on Oct 11th.

## 2011-06-23 NOTE — Progress Notes (Signed)
Instructed patient in use of One Touch Ultra glusose monitor and how to use lancet device.

## 2011-06-23 NOTE — Telephone Encounter (Signed)
lm

## 2011-06-23 NOTE — Patient Instructions (Signed)
It was a pleasure to see you today.  As we discussed, your previous lab values are consistent with a diagnosis of diabetes.  We are doing labs today to assess the status of your sugar control in the past 4 months.   I recommend you begin taking a new blood pressure medicine, lisinopril 20mg  one time daily, to help with blood pressure control and to reduce the risk of kidney complications.  Take this in addition to your metoprolol 50mg  twice daily, and your baby aspirin.   I will call you with the results of your labs next week.   Please be sure to mention to your cardiologist, Dr Sw[place]iland, that we have changed your meds when you see him next month.   I would like to see you back in early November.

## 2011-06-23 NOTE — Assessment & Plan Note (Signed)
Colin Newman meets criteria for diagnosis of DM2 by sequential FBG>125.  He is asymptomatic.  Discussed lifestyle changes to lose weight and improve glucose control.  He is given a glucometer and teaching today, plans to check one time daily and keep log.  Discussed nutritionist consult, he declines.  Of note, is very concerned about the possibility of increasing pill burden with DM2 diagnosis.  "I saw my wife take 16 pills a day and she still died of a stroke."  While previous LDL and DM2 diagnosis suggests the need for a statin (and prior CAD diagnosis), I am deferring this discussion until after labs which are drawn today.  He agrees to continue aspirin, conduct daily foot inspection.  Follow up in 6-8 weeks here for follow up.

## 2011-06-27 ENCOUNTER — Encounter: Payer: Self-pay | Admitting: Family Medicine

## 2011-07-12 ENCOUNTER — Encounter: Payer: Self-pay | Admitting: Cardiology

## 2011-07-26 ENCOUNTER — Ambulatory Visit (INDEPENDENT_AMBULATORY_CARE_PROVIDER_SITE_OTHER): Payer: Medicare Other | Admitting: Cardiology

## 2011-07-26 ENCOUNTER — Encounter: Payer: Self-pay | Admitting: Cardiology

## 2011-07-26 VITALS — BP 156/86 | HR 67 | Ht 72.0 in | Wt 243.4 lb

## 2011-07-26 DIAGNOSIS — E785 Hyperlipidemia, unspecified: Secondary | ICD-10-CM

## 2011-07-26 DIAGNOSIS — I259 Chronic ischemic heart disease, unspecified: Secondary | ICD-10-CM

## 2011-07-26 DIAGNOSIS — I10 Essential (primary) hypertension: Secondary | ICD-10-CM

## 2011-07-26 DIAGNOSIS — I2510 Atherosclerotic heart disease of native coronary artery without angina pectoris: Secondary | ICD-10-CM

## 2011-07-26 DIAGNOSIS — E119 Type 2 diabetes mellitus without complications: Secondary | ICD-10-CM

## 2011-07-26 NOTE — Assessment & Plan Note (Addendum)
He is status post stenting of the proximal LAD in 2005 with a bare-metal stent. He remains asymptomatic. His last nuclear stress test 2 years ago was normal. We need to continue to focus on risk factor modification. I will followup again in one year.

## 2011-07-26 NOTE — Assessment & Plan Note (Signed)
His blood pressure is not at goal. Target would be a systolic less than 130. He is resistant to going on additional medication. He was just recently started on lisinopril. He needs to focus on lifestyle modification with significant weight loss and increasing his aerobic activity.

## 2011-07-26 NOTE — Assessment & Plan Note (Signed)
He has combined dyslipidemia. His elevated triglycerides and low HDL are consistent with metabolic syndrome. This may improve with significant lifestyle modification and weight loss. His LDL goal is 70 given his history of coronary disease. I think he should try going on a different statin and I recommended starting with Lipitor 10 mg daily. I think it is unlikely that his LDL will change significantly with diet alone. We explained the rationale for lipid-lowering therapy to reduce his risk of cardiovascular events and stroke. Again, he understands but is resistant to taking medication.

## 2011-07-26 NOTE — Progress Notes (Signed)
Colin Newman Date of Birth: [birth year]   History of Present Illness: Colin Newman is seen for yearly followup today. He has a history of coronary disease and had stenting of the proximal LAD in 2005 with a bare-metal stent. He has done very well since then without any recurrent anginal symptoms. He reports that he walks 2 miles per day. His last stress test in October of 2010 was normal. He does have a history of hypertension, diabetes, and hyperlipidemia. He is resistant to take medications. He recently was started on fosinopril in addition to his metoprolol for blood pressure. He reports that pravastatin caused wheezing in the past. He is currently on no lipid-lowering therapy.  Current Outpatient Prescriptions on File Prior to Visit  Medication Sig Dispense Refill  . aspirin 81 MG tablet Take 81 mg by mouth daily.        [name] glucose blood (BL TEST STRIP PACK) test strip Use as instructed  100 each  12  . metoprolol (LOPRESSOR) 50 MG tablet Take 50 mg by mouth 2 (two) times daily.        . nitroGLYCERIN (NITROSTAT) 0.4 MG SL tablet Place 1 tablet (0.4 mg total) under the tongue every 5 (five) minutes as needed for chest pain.  25 tablet  11  . Omega-3 Fatty Acids (FISH OIL) 1200 MG CAPS Take 1 capsule by mouth daily.        [name] DISCONTD: lisinopril (PRINIVIL,ZESTRIL) 20 MG tablet Take 1 tablet (20 mg total) by mouth daily.  30 tablet  11    No Known Allergies  Past Medical History  Diagnosis Date  . Hypertension     with satisfactory control  . Single vessel coronary disease     s/p stent LAD 2005  . Obesity   . Glucose intolerance (pre-diabetes)     Probably   . Dyslipidemia     Hx with intolerance to statin therapy  . Constipation     Past Surgical History  Procedure Date  . Other surgical history     Stent in the proximal left anterior descending in 2005  . Bilateral knee arthroscopy   . Back surgery     2 previous  . Cardiovascular stress test 07-20-2009    EF 73%     History  Smoking status  . Former Smoker  Smokeless tobacco  . Not on file    History  Alcohol Use No    Rarely has a beer    Family History  Problem Relation Age of Onset  . Pulmonary embolism Mother   . Cancer Father     Review of Systems: The review of systems is positive for a 9 pound weight gain.  Blood pressure readings at home have been from 140-150 systolic. All other systems were reviewed and are negative.  Physical Exam: BP 156/86  Pulse 67  Ht 6' (1.829 m)  Wt 243 lb 6.4 oz (110.406 kg)  BMI 33.01 kg/m2 He is an obese white male in no acute distress.The patient is alert and oriented x 3.  The mood and affect are normal.  The skin is warm and dry.  Color is normal.  The HEENT exam reveals that the sclera are nonicteric.  The mucous membranes are moist.  The carotids are 2+ without bruits.  There is no thyromegaly.  There is no JVD.  The lungs are clear.  The chest wall is non tender.  The heart exam reveals a regular rate with a normal S1  and S2.  There are no murmurs, gallops, or rubs.  The PMI is not displaced.   Abdominal exam reveals good bowel sounds.  There is no guarding or rebound.  There is no hepatosplenomegaly or tenderness.  There are no masses.  Exam of the legs reveal no clubbing, cyanosis, or edema.  The legs are without rashes.  The distal pulses are intact.  Cranial nerves II - XII are intact.  Motor and sensory functions are intact.  The gait is normal.  LABORATORY DATA: Lab Results  Component Value Date   CHOL 225* 06/23/2011   HDL 38* 06/23/2011   LDLCALC 153* 06/23/2011   TRIG 170* 06/23/2011   CHOLHDL 5.9 06/23/2011     Assessment / Plan:

## 2011-07-27 ENCOUNTER — Ambulatory Visit: Payer: Self-pay | Admitting: Cardiology

## 2011-08-29 ENCOUNTER — Other Ambulatory Visit: Payer: Self-pay | Admitting: Cardiology

## 2011-08-29 ENCOUNTER — Telehealth: Payer: Self-pay | Admitting: Cardiology

## 2011-08-29 NOTE — Telephone Encounter (Signed)
Pt wants

## 2011-08-30 ENCOUNTER — Encounter: Payer: Self-pay | Admitting: Home Health Services

## 2011-11-21 ENCOUNTER — Encounter: Payer: Self-pay | Admitting: Family Medicine

## 2011-11-21 ENCOUNTER — Ambulatory Visit (INDEPENDENT_AMBULATORY_CARE_PROVIDER_SITE_OTHER): Payer: Medicare Other | Admitting: Family Medicine

## 2011-11-21 VITALS — BP 175/48 | HR 73 | Ht 72.0 in | Wt 245.0 lb

## 2011-11-21 DIAGNOSIS — E119 Type 2 diabetes mellitus without complications: Secondary | ICD-10-CM | POA: Diagnosis not present

## 2011-11-21 DIAGNOSIS — I10 Essential (primary) hypertension: Secondary | ICD-10-CM

## 2011-11-21 DIAGNOSIS — L309 Dermatitis, unspecified: Secondary | ICD-10-CM

## 2011-11-21 DIAGNOSIS — L270 Generalized skin eruption due to drugs and medicaments taken internally: Secondary | ICD-10-CM | POA: Insufficient documentation

## 2011-11-21 DIAGNOSIS — L259 Unspecified contact dermatitis, unspecified cause: Secondary | ICD-10-CM

## 2011-11-21 LAB — DIFFERENTIAL
Basophils Relative: 1 % (ref 0–1)
Eosinophils Absolute: 0.5 10*3/uL (ref 0.0–0.7)
Eosinophils Relative: 8 % — ABNORMAL HIGH (ref 0–5)
Lymphs Abs: 1.7 10*3/uL (ref 0.7–4.0)

## 2011-11-21 LAB — COMPREHENSIVE METABOLIC PANEL
ALT: 28 U/L (ref 0–53)
CO2: 27 mEq/L (ref 19–32)
Creat: 1.02 mg/dL (ref 0.50–1.35)
Total Bilirubin: 0.4 mg/dL (ref 0.3–1.2)

## 2011-11-21 LAB — CBC
MCH: 31.8 pg (ref 26.0–34.0)
MCHC: 33.9 g/dL (ref 30.0–36.0)
MCV: 93.8 fL (ref 78.0–100.0)
Platelets: 190 10*3/uL (ref 150–400)
RBC: 4.97 MIL/uL (ref 4.22–5.81)
RDW: 13 % (ref 11.5–15.5)

## 2011-11-21 MED ORDER — HYDROCHLOROTHIAZIDE 25 MG PO TABS: 25.0000 mg | ORAL_TABLET | Freq: Every day | ORAL | Status: DC

## 2011-11-21 MED ORDER — HYDROXYZINE PAMOATE 25 MG PO CAPS: 25.0000 mg | ORAL_CAPSULE | Freq: Four times a day (QID) | ORAL | Status: AC | PRN

## 2011-11-21 NOTE — Patient Instructions (Signed)
Take Hydroxyzine 25 mg 1-2 tablets every 6 hours for itching.  Stop the Lisinopril.   Take HCTZ 25 mg one tablet daily starting in AM  Call late afternoon tomorrow for lab results. [phone]

## 2011-11-21 NOTE — Progress Notes (Signed)
  Subjective:    Patient ID: Colin Newman, male    DOB: November 19, 1932, 78 y.o.   MRN: [id number]  HPI Past week he's had a generalized rash which is most itchy on his head. He denies scratching it but has linear lesions on his trunk legs and feet. He denies any fever or chills. No recent new medications. He was started on lisinopril this past September. Didn't follow-up in 6 weeks as requested. He has tried Benadryl for the itching without benefit.   Review of Systems  Constitutional: Negative for fever, chills, diaphoresis and unexpected weight change.  Eyes: Negative for pain.  Respiratory: Positive for cough. Negative for shortness of breath and wheezing.   Genitourinary: Negative for difficulty urinating.  Skin: Positive for rash.  Hematological: Negative for adenopathy. Does not bruise/bleed easily.       Objective:   Physical Exam Elderly white man with abdominal obesity. He does not appear significantly ill. Generalized dermatitis wishing includes linear areas of erythema on the lower thighs lower legs and feet. He is non-blanching petechia particularly on the feet. Generalized macular lesions worse on the scalp. Underlying dry skin and solar skin changes.   Chest clear Heart regular rhythm without murmur Abdomen soft tender in right upper quadrant where his liver seem to be palpable about 3 cm below the right costal margin Extremities no ankle edema He showed no signs of arthralgias and walked without discomfort       Assessment & Plan:

## 2011-11-22 LAB — SEDIMENTATION RATE: Sed Rate: 1 mm/hr (ref 0–16)

## 2011-11-22 MED ORDER — PREDNISONE 20 MG PO TABS: 60.0000 mg | ORAL_TABLET | Freq: Every day | ORAL | Status: DC

## 2011-11-22 NOTE — Assessment & Plan Note (Signed)
With eosinophilia and normal WBC and no fever and low ESR this is likely an acute drug reaction. Lisinopril is most likely, especially with the dry cough, so I replaced that with HCTZ.  I called him when I got the labs and sent in Prednisone to take 60 mg for 5 days. He will monitor his cbg's. I discussed with Dr Mauricio Po and will arrange follow up with him

## 2011-11-23 ENCOUNTER — Telehealth: Payer: Self-pay | Admitting: Family Medicine

## 2011-11-23 NOTE — Telephone Encounter (Signed)
Message copied by Barnie Alderman on Thu Nov 23, 2011  8:46 AM ------      Message from: Zachery Dauer      Created: Wed Nov 22, 2011  4:41 PM      Regarding: followup appt       Please schedule him with Dr Mauricio Po early next week for follow up of his rash. Dr Mauricio Po is aware. Reasonable to overbook for this patient.

## 2011-11-23 NOTE — Telephone Encounter (Signed)
Per Dr Sheffield Slider pt is sched w/Dr breen for Tuesday 2/12 at 11:00. Pt notified.

## 2011-11-28 ENCOUNTER — Encounter: Payer: Self-pay | Admitting: Family Medicine

## 2011-11-28 ENCOUNTER — Ambulatory Visit (INDEPENDENT_AMBULATORY_CARE_PROVIDER_SITE_OTHER): Payer: Medicare Other | Admitting: Family Medicine

## 2011-11-28 DIAGNOSIS — L270 Generalized skin eruption due to drugs and medicaments taken internally: Secondary | ICD-10-CM

## 2011-11-28 DIAGNOSIS — I10 Essential (primary) hypertension: Secondary | ICD-10-CM

## 2011-11-28 DIAGNOSIS — T50904A Poisoning by unspecified drugs, medicaments and biological substances, undetermined, initial encounter: Secondary | ICD-10-CM | POA: Diagnosis not present

## 2011-11-28 MED ORDER — METOPROLOL TARTRATE 50 MG PO TABS: ORAL_TABLET | ORAL | Status: DC

## 2011-11-28 NOTE — Assessment & Plan Note (Signed)
Resolved with suspension of lisinopril.  Hesistant to attempt to change to ARB>  ACEI listed as allergy in Epic.

## 2011-11-28 NOTE — Assessment & Plan Note (Signed)
Patient with hypertension, CAD with prior PCI placement, recently taken off lisinopril after development of severe diffuse rash and cough, both of which resolved with suspension of the lisinopril and short prednisone burst.  Given this history, I am reticent to try an ARB in this gentleman.  He doesn't recall why he was taken off amlodipine in the past, but thinks it caused wheezing.  Plan to increase modestly his dose of metoprolol for the time being, recheck his BP with nurse visit in the coming 2-4 weeks and then reassess.   He plans to resume walking and diet change to lose weight, which he is confident he can do when weather is more permitting.

## 2011-11-28 NOTE — Patient Instructions (Signed)
It was a pleasure to see you today.  We will make the following changes to your medications:  1. Increase the metoprolol 50mg  tablets to 1 tab in the morning and 2 tabs in the evening.  You may check your bp at home or the drugstore.   2.  Please do not resume the lisinopril, as this appears to have been the cause of your rash and your cough.   3. I ask that you see the RN for a NURSE VISIT in the coming 2 to 4 weeks to recheck your blood pressure.

## 2011-11-28 NOTE — Progress Notes (Signed)
  Subjective:    Patient ID: Colin Newman, male    DOB: 1932-11-04, 78 y.o.   MRN: [id number]  HPI Patient seen in follow up after rash and cough, that was believed associated with lisinopril which he had taken since Nov 2012 without incident.  Since stopping the lisinopril and taking the prednisone burst, the cough disappeared and the rash has nearly completely resolved.  No pruritus at this time.   No chest discomfort, no shortness of breath. HCTZ did not give nocturia or any other side effects that he noticed.   Plans to resume activity to lose weight in the Spring; feels he has gained too much weight.    Reviewed lab results from last visit with patient.    Review of Systems In the past was on Norvasc, patient unsure why it was stopped (thinks it was causing breathing problems or wheezing).     Objective:   Physical Exam Well appearing, no apparent distress Neck supple.  COR S1S2, no extra sounds PULM Clear bilaterally SKIN Trace fine blanching rash over abdomen, very mild.  Compared with photos from prior visit, appears much improved.       Assessment & Plan:

## 2011-12-26 ENCOUNTER — Ambulatory Visit (INDEPENDENT_AMBULATORY_CARE_PROVIDER_SITE_OTHER): Payer: Medicare Other | Admitting: *Deleted

## 2011-12-26 VITALS — BP 172/88 | HR 86

## 2011-12-26 DIAGNOSIS — I10 Essential (primary) hypertension: Secondary | ICD-10-CM

## 2011-12-26 MED ORDER — AMLODIPINE BESYLATE 5 MG PO TABS: 5.0000 mg | ORAL_TABLET | Freq: Every day | ORAL | Status: DC

## 2011-12-26 NOTE — Progress Notes (Signed)
In for BP check.  BP checked manually using regular adult cuff.  BP LA 190/90 and RA 180/90. Pulse 86. Patient sat and rested for 10 minutes then rechecked , LA 178/82 and RA 172/88.   States he stopped  HCTZ  two  weeks ago because  he read the side effects listed and he has similar symptoms., no energy, and elevated Blood Sugar. Taking metoprolol as directed . Will forward message to Dr. Mauricio Po and call patient back at [phone].

## 2011-12-26 NOTE — Progress Notes (Signed)
Message given to patient and appointment scheduled for 03/26 with Dr. Mauricio Po

## 2011-12-26 NOTE — Progress Notes (Signed)
  Subjective:    Patient ID: Colin Newman, male    DOB: 1933-08-06, 78 y.o.   MRN: [id number]  HPI    Review of Systems     Objective:   Physical Exam        Assessment & Plan:  Patient seen by RN in her clinic.  BPs still not at goal.  Diffuse rash with ACEI.  Has been on norvasc in the past, question of whether he had wheezing but not very definitive history.  Will retry norvasc 5mg  daily, and office visit in the coming month to reassess.  He is to stop norvasc with and respiratory compalints.

## 2012-01-09 ENCOUNTER — Ambulatory Visit (INDEPENDENT_AMBULATORY_CARE_PROVIDER_SITE_OTHER): Payer: Medicare Other | Admitting: Family Medicine

## 2012-01-09 ENCOUNTER — Encounter: Payer: Self-pay | Admitting: Family Medicine

## 2012-01-09 VITALS — BP 154/70 | HR 72 | Ht 72.0 in | Wt 250.9 lb

## 2012-01-09 DIAGNOSIS — I10 Essential (primary) hypertension: Secondary | ICD-10-CM | POA: Diagnosis not present

## 2012-01-09 DIAGNOSIS — T50904A Poisoning by unspecified drugs, medicaments and biological substances, undetermined, initial encounter: Secondary | ICD-10-CM

## 2012-01-09 DIAGNOSIS — E785 Hyperlipidemia, unspecified: Secondary | ICD-10-CM | POA: Diagnosis not present

## 2012-01-09 DIAGNOSIS — L270 Generalized skin eruption due to drugs and medicaments taken internally: Secondary | ICD-10-CM | POA: Diagnosis not present

## 2012-01-09 MED ORDER — ROSUVASTATIN CALCIUM 10 MG PO TABS: 10.0000 mg | ORAL_TABLET | Freq: Every day | ORAL | Status: DC

## 2012-01-09 NOTE — Assessment & Plan Note (Signed)
Has resolved since stopping the lisinopril.  ACEI listed as allergy on profile.

## 2012-01-09 NOTE — Patient Instructions (Addendum)
PLAN:  1. Stop the amlodipine (Norvasc).  2. Increase the Metoprolol (Lopressor) 50mg  tablets, to 2 tabs twice daily.   3. START CRESTOR 10mg  by mouth at bedtime.  I will want to recheck your LDL cholesterol in another 8 to 10 weeks (this can be done as a nonfasting lab, any hour of the day).   4. Bring your blood pressure cuff to a nurse BP visit in the coming 2 to 4 weeks, to see if your cuff readings match our cuff in the office.

## 2012-01-09 NOTE — Assessment & Plan Note (Signed)
SBP 150 this morning on Norvasc and lopressor.  He did not tolerate HCTZ (wheezing).   Will go up slightly on Lopressor to 100mg  twice daily, recheck at RN visit in 2 weeks.  He is to bring his home cuff for check and comparison with our measurement.

## 2012-01-09 NOTE — Assessment & Plan Note (Signed)
Unsure which statin he had been on before.  Thinks it was lipitor, but does not recall.  EMR shows Crestor to be on formulary, and atorvastatin to be off formulary.  Will try low dose Crestor and recehck LDL in 8 weeks.  Patient counseled on possible side effects and to call if these occur.

## 2012-01-09 NOTE — Progress Notes (Signed)
  Subjective:    Patient ID: Colin Newman, male    DOB: [birth year], 78 y.o.   MRN: [id number]  HPI Patient here for follow up.  Has been taking the amlodipine, took a dose this morning.  Has felt very worn down since starting the amlodipine, as well as constipation.  Is similar to his recollection of this medicine from past attempts to take it.  No chest pain or shortness of breath, no ankle swelling.    We reviewed his elevated LDL (153 at last check in the Fall); has been on a statin in the past but not in awhile (thinks he may have been on lipitor).  Has had PCI performed in the past.  Dr. [place] is his cardiologist.   Is four years since his wife died. Review of SystemsSee HPI     Objective:   Physical Exam Alert, well appearing, no apparent dsitress HEENT Neck supple.  COR S1S2, no extra sounds PULM Clear bilaterally.  LEs without ankle edema.         Assessment & Plan:

## 2012-02-06 ENCOUNTER — Ambulatory Visit (INDEPENDENT_AMBULATORY_CARE_PROVIDER_SITE_OTHER): Payer: Medicare Other | Admitting: *Deleted

## 2012-02-06 VITALS — BP 166/84 | HR 76

## 2012-02-06 DIAGNOSIS — I10 Essential (primary) hypertension: Secondary | ICD-10-CM

## 2012-02-06 MED ORDER — ONETOUCH ULTRASOFT LANCETS MISC: Status: AC

## 2012-02-06 MED ORDER — METOPROLOL TARTRATE 100 MG PO TABS: 100.0000 mg | ORAL_TABLET | Freq: Two times a day (BID) | ORAL | Status: DC

## 2012-02-06 MED ORDER — ONETOUCH ULTRA SYSTEM W/DEVICE KIT: 1.0000 | PACK | Freq: Once | Status: DC

## 2012-02-06 NOTE — Progress Notes (Signed)
Dr. Mauricio Po sent in new RX for Lopressor 100 mg tabs and lancets. States patient needs appointment to return to follow up about BP . Message left on voicemail to return call.

## 2012-02-06 NOTE — Progress Notes (Signed)
Patient comes in for BP check . BP checked manually using regular adult cuff. BP LA 166/86 and RA 166/84.  Pulse 76.  Patient brought in his home BP monitor and this is checked now on left  wrist  168/87 pulse 81   Did not bring meds with him. Reviewed meds with him and he states he never started taking Norvasc after nurse visit 12/26/2011. States he was not taking Norvasc at last visit with Dr. Mauricio Po . States he is taking Lopressor 2 tabs twice daily  and Crestor.   He does not want to take Norvasc . States he had coughing, wheezing, elevated Blood sugars. Reminded him that he reported symptoms similar with HCTZ.[name]  He agrees but also had these reactions  with Norvasc   States he is feeling well.   Wants RX sent for lancets, [employer] [facility] [employer] .  If Dr. Mauricio Po plans on continuing Lopressor 100 mg twice daily , he wants 100 mg tabs sent to pharmacy.   will forward this message to Dr. Mauricio Po.

## 2012-02-06 NOTE — Progress Notes (Signed)
Appointment scheduled for follow up regarding BP 02/23/2012

## 2012-02-12 ENCOUNTER — Telehealth: Payer: Self-pay | Admitting: Family Medicine

## 2012-02-12 MED ORDER — METOPROLOL TARTRATE 100 MG PO TABS: 100.0000 mg | ORAL_TABLET | Freq: Two times a day (BID) | ORAL | Status: DC

## 2012-02-12 NOTE — Telephone Encounter (Signed)
Metoprolol 100mg  tablets reordered, routed to pharmacy.  JB

## 2012-02-12 NOTE — Telephone Encounter (Signed)
Dr. Ventura Bruns see previous message about refill.  I closed chart in error.  Gaylene Brooks, RN

## 2012-02-12 NOTE — Telephone Encounter (Signed)
Returned call to patient and informed med was refilled.  Gaylene Brooks, RN

## 2012-02-12 NOTE — Telephone Encounter (Signed)
Pt needs to speak to nurse about his Metoprolol - he is concerned about the dosage and insurance not paying for it

## 2012-02-12 NOTE — Telephone Encounter (Signed)
Returned call to patient.  Had been taking Metoprolol 50 mg--one tab BID and dose was increased to 2 tabs (100mg ) BID.  Patient is out of meds and would like 100mg  tabs instead of 50mg  tabs refilled to pharmacy.  Will route note to Dr. Mauricio Po and call patient back.  Gaylene Brooks, RN

## 2012-02-12 NOTE — Telephone Encounter (Signed)
Addended by: Barbaraann Barthel on: 02/12/2012 11:24 AM   Modules accepted: Orders

## 2012-02-23 ENCOUNTER — Encounter: Payer: Self-pay | Admitting: Family Medicine

## 2012-02-23 ENCOUNTER — Ambulatory Visit (INDEPENDENT_AMBULATORY_CARE_PROVIDER_SITE_OTHER): Payer: Medicare Other | Admitting: Family Medicine

## 2012-02-23 VITALS — BP 176/81 | HR 69 | Ht 72.0 in | Wt 248.0 lb

## 2012-02-23 DIAGNOSIS — I10 Essential (primary) hypertension: Secondary | ICD-10-CM | POA: Diagnosis not present

## 2012-02-23 MED ORDER — ATORVASTATIN CALCIUM 40 MG PO TABS: 40.0000 mg | ORAL_TABLET | Freq: Every day | ORAL | Status: DC

## 2012-02-23 NOTE — Patient Instructions (Signed)
It was a pleasure to see you today.  I would like to have you see the Pharmacy Clinic for 24 hour blood pressure monitoring.  Because of the high cost of the Crestor, I changed you back to generic atorvastatin 40mg  one time daily, today.  You may change when the Crestor runs out; I sent the new prescription to [facility] on [address]  I will contact you after your labs on May 28th.

## 2012-02-23 NOTE — Progress Notes (Signed)
  Subjective:    Patient ID: Colin Newman, male    DOB: 06-Feb-1933, 78 y.o.   MRN: [id number]  HPI  Patient in for follow up of HTN.  He notes that he slipped in the yard recently and injured his right knee and ankle, believes the pain is associated with worsening blood pressure control.  His ankle has improved, swelling in ankle and knee is better and he is ambulating without assistance.    Home BP readings with SBP ranges 143-154, two outliers 172, 168.  DBP ranges 70-80s.  Is taking metoprolol 100mg  bid at present.  He has been intolerant of most of the other BP meds we have tried:  Lisinopril:  Severe generalized rash and itch, which excludes ACEI/ARBs from consideration Amlodipine: intolerance which he described as wheezing, that got better when the med was discontinued.  HCTZ: He has concerns about its potential to raise blood sugar.  Of note, it was started along with prednisone at the time of the ACEI-related rash, which I believe heightened his level of concern about its true effect on his sugar.    Review of Systems  Denies chest pain, dyspnea, cough, fevers or chills.  Denies swelling in extremities other than that related to R knee/ankle injury.  No rashes since discontinuing the ACEI.     Objective:   Physical Exam Well appearing, no apparent distress HEENT Neck supple.  SKin clear.  R knee with mild edema, no effusion.  Walks without assistance, steady on feet.        Assessment & Plan:

## 2012-02-23 NOTE — Assessment & Plan Note (Signed)
Patient with HTN and DM; has been intolerant or allergic to several BP meds, and thus his BP control has been a challenge.  By his home monitoring, most values in the 140-150 range which, for a 78 year old man, may be a suitable goal from the standpoint of optimizing CV-related morbidity and mortality.  Will refer for consideration of 24h BP monitoring by  [facility], as well as their thoughts on appropriateness of additional medication for BP.  Patient is averse to taking more medication than deemed absolutely necessary.

## 2012-02-29 ENCOUNTER — Ambulatory Visit: Payer: Medicare Other | Admitting: Pharmacist

## 2012-02-29 ENCOUNTER — Encounter: Payer: Self-pay | Admitting: Pharmacist

## 2012-02-29 VITALS — BP 164/72 | Ht 72.0 in | Wt 252.2 lb

## 2012-02-29 DIAGNOSIS — I10 Essential (primary) hypertension: Secondary | ICD-10-CM

## 2012-02-29 NOTE — Progress Notes (Signed)
  Subjective:    Patient ID: Colin Newman, male    DOB: 1933/05/29, 78 y.o.   MRN: [id number]  HPI 78yoM presented to the clinic for 24-hr ambulatory blood pressure monitoring per Dr. Marinell Blight referral. Patient reports home blood pressures of 150's/75-80s at home, however BP in clinic is generally higher. Has tried other medications in the past but has had intolerances/allerigies but could not recall all of the medications. Patient reports good compliance and denies missing any doses over the last 2 weeks. Does endorse some back pain related to a recent fall which may affect his blood pressure control. Reports occasional lightheadedness upon standing quickly--discussed importance of getting up slowly. Discussed procedure for wearing monitor and monitor was placed on non-dominant arm. Patient to returned to clinic Friday morning for monitor removal. Overall no significant issues with the BP study. Reports the day was like any other day and had minimal interruption in sleep overnight.  Current Antihypertensive Regimen Metoprolol 100mg  BID  Allergies/Intolerances Lisinopril - Cough, Rash, Hives Amlodipine - Caused elevated blood sugars  Review of Systems     Objective:   Physical Exam  Last several office readings: 02/23/12: 176/81 mmHg 02/06/12: 166/84 mmHg 01/09/12: 154/70 mmHg 12/26/11: 172/88 mmHg  Office BP reading today:  164/72 mmHg (manual)  24-ABPM Study Data:  Arm Placement left arm    Overall Mean 24hr BP:   152/69 mmHg HR: 66   Daytime Mean BP:    156/70 mmHg HR: 65  Nighttime Mean BP:    136/65 mmHg HR: 66  Dipping Pattern:    Yes; Sys (-13.1%); Dia (-7.0%) [normal dipping ~10-20%]      Assessment & Plan:   67 yoM with what appears to be isolated systolic hypertension related to aging, with a 24-hour average of 152/69 mmHg (daytime average 156/70) with normal dipping pattern present. Patient currently taking metoprolol 100 mg BID with occasional complaints of  lightheadedness. No changes to medications made today. Would be cautious with additional medications as patient is having occasional lightheadedness, but may consider low dose options such as losartan 25 mg (caution w/ACEI allergy), amlodipine 2.5mg  daily (monitor blood sugars based on previous intolerance), or hydrochlorothiazide 12.5mg  daily.  Patient seen with Melynda Ripple, PharmD. Candidate and Benjaman Pott, PharmD, Pharmacy Resident.

## 2012-02-29 NOTE — Patient Instructions (Addendum)
It was good to meet you!  Your overall 24 hour blood pressure was 152/69 mmHg and you have a normal "dipping" pattern while you sleep.  We will not make any changes to your medications today, but we will let your doctor know about your results.   Follow-up with Dr. Mauricio Po

## 2012-03-01 NOTE — Progress Notes (Signed)
Patient ID: Colin Newman, male   DOB: 1933-03-04, 78 y.o.   MRN: [id number] Reviewed and agree with Dr. Macky Lower management.

## 2012-03-01 NOTE — Assessment & Plan Note (Signed)
38 yoM with what appears to be isolated systolic hypertension related to aging, with a 24-hour average of 152/69 mmHg (daytime average 156/70) with normal dipping pattern present. Patient currently taking metoprolol 100 mg BID with occasional complaints of lightheadedness. No changes to medications made today. Would be cautious with additional medications as patient is having occasional lightheadedness, but may consider low dose options such as losartan 25 mg (caution w/ACEI allergy), amlodipine 2.5mg  daily (monitor blood sugars based on previous intolerance), or hydrochlorothiazide 12.5mg  daily.  Patient seen with Melynda Ripple, PharmD. Candidate and Benjaman Pott, PharmD, Pharmacy Resident.

## 2012-03-12 ENCOUNTER — Other Ambulatory Visit: Payer: Medicare Other

## 2012-03-12 DIAGNOSIS — E785 Hyperlipidemia, unspecified: Secondary | ICD-10-CM

## 2012-03-12 NOTE — Progress Notes (Signed)
D-LDL DONE TODAY Colin Newman 

## 2012-03-14 ENCOUNTER — Ambulatory Visit (INDEPENDENT_AMBULATORY_CARE_PROVIDER_SITE_OTHER): Payer: Medicare Other | Admitting: Family Medicine

## 2012-03-14 ENCOUNTER — Encounter: Payer: Self-pay | Admitting: Family Medicine

## 2012-03-14 VITALS — BP 168/72 | HR 69 | Temp 98.3°F | Ht 72.0 in | Wt 251.0 lb

## 2012-03-14 DIAGNOSIS — M549 Dorsalgia, unspecified: Secondary | ICD-10-CM | POA: Diagnosis not present

## 2012-03-14 DIAGNOSIS — M545 Low back pain, unspecified: Secondary | ICD-10-CM | POA: Diagnosis not present

## 2012-03-14 MED ORDER — DEXAMETHASONE SODIUM PHOSPHATE 10 MG/ML IJ SOLN
10.0000 mg | Freq: Once | INTRAMUSCULAR | Status: AC
  Administered 2012-03-14: 10 mg via INTRAMUSCULAR

## 2012-03-14 MED ORDER — CYCLOBENZAPRINE HCL 5 MG PO TABS: 5.0000 mg | ORAL_TABLET | Freq: Three times a day (TID) | ORAL | Status: DC | PRN

## 2012-03-14 MED ORDER — TRAMADOL HCL 50 MG PO TABS: 50.0000 mg | ORAL_TABLET | Freq: Three times a day (TID) | ORAL | Status: DC | PRN

## 2012-03-14 MED ORDER — KETOROLAC TROMETHAMINE 60 MG/2ML IM SOLN
60.0000 mg | Freq: Once | INTRAMUSCULAR | Status: AC
  Administered 2012-03-14: 60 mg via INTRAMUSCULAR

## 2012-03-14 NOTE — Patient Instructions (Signed)
Very nice to meet you. I'm giving you to medicines for your back. One is called Flexeril as a muscle relaxant. You can take one pill up to 3 times a day as needed. These be careful this medicine might make it tired. I'm also giving you a medicine called tramadol. You can also take this up to 3 times a day if you are having extreme pain. This medicine can also make you very tired and potentially dizzy please be careful going from a sitting to standing position. I'm giving you a couple injections today to try to help with the inflammation and the muscle spasm. I when she to start doing some of the stretches I gave you. I when she to come back next Friday and see Dr. Mauricio Po to make sure you're doing better. If not will consider getting some imaging done.  Back Exercises These exercises may help you when beginning to rehabilitate your injury. Your symptoms may resolve with or without further involvement from your physician, physical therapist or athletic trainer. While completing these exercises, remember:   Restoring tissue flexibility helps normal motion to return to the joints. This allows healthier, less painful movement and activity.   An effective stretch should be held for at least 30 seconds.   A stretch should never be painful. You should only feel a gentle lengthening or release in the stretched tissue.  STRETCH - Extension, Prone on Elbows   Lie on your stomach on the floor, a bed will be too soft. Place your palms about shoulder width apart and at the height of your head.   Place your elbows under your shoulders. If this is too painful, stack pillows under your chest.   Allow your body to relax so that your hips drop lower and make contact more completely with the floor.   Hold this position for __________ seconds.   Slowly return to lying flat on the floor.  Repeat __________ times. Complete this exercise __________ times per day.  RANGE OF MOTION - Extension, Prone Press Ups    Lie on your stomach on the floor, a bed will be too soft. Place your palms about shoulder width apart and at the height of your head.   Keeping your back as relaxed as possible, slowly straighten your elbows while keeping your hips on the floor. You may adjust the placement of your hands to maximize your comfort. As you gain motion, your hands will come more underneath your shoulders.   Hold this position __________ seconds.   Slowly return to lying flat on the floor.  Repeat __________ times. Complete this exercise __________ times per day.  RANGE OF MOTION- Quadruped, Neutral Spine   Assume a hands and knees position on a firm surface. Keep your hands under your shoulders and your knees under your hips. You may place padding under your knees for comfort.   Drop your head and point your tail bone toward the ground below you. This will round out your low back like an angry cat. Hold this position for __________ seconds.   Slowly lift your head and release your tail bone so that your back sags into a large arch, like an old horse.   Hold this position for __________ seconds.   Repeat this until you feel limber in your low back.   Now, find your "sweet spot." This will be the most comfortable position somewhere between the two previous positions. This is your neutral spine. Once you have found this position, tense  your stomach muscles to support your low back.   Hold this position for __________ seconds.  Repeat __________ times. Complete this exercise __________ times per day.  STRETCH - Flexion, Single Knee to Chest   Lie on a firm bed or floor with both legs extended in front of you.   Keeping one leg in contact with the floor, bring your opposite knee to your chest. Hold your leg in place by either grabbing behind your thigh or at your knee.   Pull until you feel a gentle stretch in your low back. Hold __________ seconds.   Slowly release your grasp and repeat the exercise with  the opposite side.  Repeat __________ times. Complete this exercise __________ times per day.  STRETCH - Hamstrings, Standing  Stand or sit and extend your right / left leg, placing your foot on a chair or foot stool   Keeping a slight arch in your low back and your hips straight forward.   Lead with your chest and lean forward at the waist until you feel a gentle stretch in the back of your right / left knee or thigh. (When done correctly, this exercise requires leaning only a small distance.)   Hold this position for __________ seconds.  Repeat __________ times. Complete this stretch __________ times per day. STRENGTHENING - Deep Abdominals, Pelvic Tilt   Lie on a firm bed or floor. Keeping your legs in front of you, bend your knees so they are both pointed toward the ceiling and your feet are flat on the floor.   Tense your lower abdominal muscles to press your low back into the floor. This motion will rotate your pelvis so that your tail bone is scooping upwards rather than pointing at your feet or into the floor.   With a gentle tension and even breathing, hold this position for __________ seconds.  Repeat __________ times. Complete this exercise __________ times per day.  STRENGTHENING - Abdominals, Crunches   Lie on a firm bed or floor. Keeping your legs in front of you, bend your knees so they are both pointed toward the ceiling and your feet are flat on the floor. Cross your arms over your chest.   Slightly tip your chin down without bending your neck.   Tense your abdominals and slowly lift your trunk high enough to just clear your shoulder blades. Lifting higher can put excessive stress on the low back and does not further strengthen your abdominal muscles.   Control your return to the starting position.  Repeat __________ times. Complete this exercise __________ times per day.  STRENGTHENING - Quadruped, Opposite UE/LE Lift   Assume a hands and knees position on a firm  surface. Keep your hands under your shoulders and your knees under your hips. You may place padding under your knees for comfort.   Find your neutral spine and gently tense your abdominal muscles so that you can maintain this position. Your shoulders and hips should form a rectangle that is parallel with the floor and is not twisted.   Keeping your trunk steady, lift your right hand no higher than your shoulder and then your left leg no higher than your hip. Make sure you are not holding your breath. Hold this position __________ seconds.   Continuing to keep your abdominal muscles tense and your back steady, slowly return to your starting position. Repeat with the opposite arm and leg.  Repeat __________ times. Complete this exercise __________ times per day. Document Released: 10/20/2005  Document Revised: 09/21/2011 Document Reviewed: 01/14/2009 [facility] Patient Information 2012 [place], [place].

## 2012-03-14 NOTE — Assessment & Plan Note (Addendum)
  ASSESSMENT:  lumbar strain and with radiculopathy Differential does include spinal stenosis, herniated disc, or spondyltethsis   PLAN: For acute pain, rest, intermittent application of cold packs (later, may switch to heat, but do not sleep on heating pad), analgesics and muscle relaxants are recommended. Discussed longer term treatment plan of prn NSAID's and discussed a home back care exercise program with flexion exercise routine. Proper lifting with avoidance of heavy lifting discussed.  Follow up with PCP one week. Consider Physical Therapy and XRay studies if not improving. Call or return to clinic prn if these symptoms worsen or fail to improve as anticipated.

## 2012-03-14 NOTE — Progress Notes (Signed)
  Subjective:    Patient ID: Colin Newman, male    DOB: Dec 21, 1932, 78 y.o.   MRN: [id number]  HPI SUBJECTIVE:  Colin Newman is a 78 y.o. male who complains of an injury causing low back pain 3  week(s) ago. The pain is positional with bending or lifting, with radiation down the left leg but not past the knee. Mechanism of injury: Fell on left hip. Symptoms have been intermittent since that time. Prior history of back problems: recurrent self limited episodes of low back pain in the past. There is no numbness in the legs.  OBJECTIVE: Vital signs as noted above. Patient appears to be in mild to moderate pain, antalgic gait noted. Lumbosacral spine area reveals no local tenderness or mass. Painful and reduced LS ROM noted. Straight leg raise is negative, DTR's, motor strength and sensation normal, including heel and toe gait.  Peripheral pulses are palpable. Lumbar spine X-Ray: not indicated.   ASSESSMENT:  lumbar strain and with radiculopathy Differential does include spinal stenosis, herniated disc, or spondyltethsis   PLAN: For acute pain, rest, intermittent application of cold packs (later, may switch to heat, but do not sleep on heating pad), analgesics and muscle relaxants are recommended. Discussed longer term treatment plan of prn NSAID's and discussed a home back care exercise program with flexion exercise routine. Proper lifting with avoidance of heavy lifting discussed.  Consider Physical Therapy and XRay studies if not improving. Call or return to clinic prn if these symptoms worsen or fail to improve as anticipated.    Review of Systems     Objective:   Physical Exam        Assessment & Plan:

## 2012-03-18 ENCOUNTER — Telehealth: Payer: Self-pay | Admitting: Family Medicine

## 2012-03-18 NOTE — Telephone Encounter (Signed)
Called patient to report excellent LDL results from last week.  He is taking atorvastatin 40mg  daily, no dietary changes.  He has continued with low back pain, is doing okay if he takes NSAIDs.  To be seen this Friday. Paula Compton, MD

## 2012-03-22 ENCOUNTER — Ambulatory Visit (INDEPENDENT_AMBULATORY_CARE_PROVIDER_SITE_OTHER): Payer: Medicare Other | Admitting: Family Medicine

## 2012-03-22 ENCOUNTER — Encounter: Payer: Self-pay | Admitting: Family Medicine

## 2012-03-22 ENCOUNTER — Ambulatory Visit
Admission: RE | Admit: 2012-03-22 | Discharge: 2012-03-22 | Disposition: A | Discharge status: Home / Self Care | Payer: Medicare Other | Source: Ambulatory Visit | Attending: Family Medicine | Admitting: Family Medicine

## 2012-03-22 ENCOUNTER — Telehealth: Payer: Self-pay | Admitting: Family Medicine

## 2012-03-22 VITALS — BP 186/80 | HR 66 | Ht 72.0 in | Wt 250.0 lb

## 2012-03-22 DIAGNOSIS — M545 Low back pain, unspecified: Secondary | ICD-10-CM

## 2012-03-22 DIAGNOSIS — M5137 Other intervertebral disc degeneration, lumbosacral region: Secondary | ICD-10-CM | POA: Diagnosis not present

## 2012-03-22 DIAGNOSIS — M47817 Spondylosis without myelopathy or radiculopathy, lumbosacral region: Secondary | ICD-10-CM | POA: Diagnosis not present

## 2012-03-22 DIAGNOSIS — M25559 Pain in unspecified hip: Secondary | ICD-10-CM | POA: Diagnosis not present

## 2012-03-22 MED ORDER — TRAMADOL HCL 50 MG PO TABS: 50.0000 mg | ORAL_TABLET | Freq: Three times a day (TID) | ORAL | Status: AC | PRN

## 2012-03-22 MED ORDER — DIAZEPAM 10 MG PO TABS
10.0000 mg | ORAL_TABLET | Freq: Two times a day (BID) | ORAL | Status: AC | PRN
Start: 2012-03-22 — End: 2012-04-01

## 2012-03-22 NOTE — Telephone Encounter (Signed)
Called patient to report findings of xray, which shows coccygeal fracture. Discussed donut cushion, heat and/or cold packs, use of Tylenol and/or ibuprofen as needed, may use Tramadol as needed as well.  Likely will take several weeks to heal.   Paula Compton, MD

## 2012-03-22 NOTE — Assessment & Plan Note (Signed)
Pain after a fall 5 weeks ago. Minimal improvement, still bothers him. I am getting xray to rule out compression fx.  Will change his flexeril to nightly diazepam (he has taken this before for anxiety, well tolerated in the past).  We discussed at length that this is not for sleep aid or for chronic use; only intended as temporary muscle relaxant. To keep up with stretching and exercises.  He is not too interested in formal PT evaluation.  If xray negative and not improvement with Tramadol/tylenol/diazepam/stretching, then to consider [facility] referral to see if they think SI joint injection is indicated (this seems to be where the pain is most pronounced).

## 2012-03-22 NOTE — Patient Instructions (Signed)
It was a pleasure to see you today.   For the left hip/back pain, I ordered an x-ray of your low back to look for possible fractures (compression fractures) that might be causing your pain.   For the pain, I recommend you keep using the Ultram 50mg  every 8 hours only as needed.  May take Tylenol and try it again, now that the pain is improving.   For muscle relaxant properties, switch to the diazepam 10mg  at bedtime.  This is NOT intended to be a long-term medication for you, as it is habit-forming.  If no improvement in the back pain in the coming 1 week with this medicine, we probably should discontinue it anyway.   Please call me next week (Weds or Thurs) if not making adequate improvement, and I will arrange for [facility] to be [facility].

## 2012-03-22 NOTE — Progress Notes (Signed)
  Subjective:    Patient ID: Colin Newman, male    DOB: 31-Oct-1932, 78 y.o.   MRN: [id number]  HPI  For follow up of left low back pain. Has been doing the exercises/stretches given by Dr Katrinka Blazing, has noticed some very minimal improvement.  Not weakness, just pain.  Worse with sitting, or laying on L side.  Best when walking or standing.  Attributes to a fall he had 5-1/2 weeks ago. Radiates to L buttock but not below knee.  Distant history of low back surgery over 20 years ago, has done well since that time.  Also s/p bilateral knee replacements around 6 years ago, no knee pain.   The flexeril makes him sleepy, minimal if any improvement in back pain.  Not taking NSAIDs at this time.  Tylenol and ibuprofen did not touch his back pain initially after the fall.   Ultram helps pain but is making it harder to pass his urine (BPH history).  No dysuria.      Review of Systems See above.  No additional falls. Does not need to lean on grocery cart at the store     Objective:   Physical Exam Well appearing, no apparent distress. HEENT Neck supple.  MSK: Able to stand on toes.  Able to squat while keeping back straight.  Tenderness to palpate along SI joint on left side.  Negative straight leg raise. Full active ROM with left hip flexion and extension, internal/ext rotation and ab/adduction. Sensation in both feet is grossly intact; palpable dp pulses bilaterally. Trace pitting edema in right ankle compared with left. No lesions in feet. No clonus.   Able to walk , transfer and stand without difficulty.      Assessment & Plan:

## 2012-07-26 ENCOUNTER — Encounter: Payer: Self-pay | Admitting: Cardiology

## 2012-07-26 ENCOUNTER — Ambulatory Visit (INDEPENDENT_AMBULATORY_CARE_PROVIDER_SITE_OTHER): Payer: Medicare Other | Admitting: Cardiology

## 2012-07-26 VITALS — BP 178/68 | HR 75 | Ht 72.0 in | Wt 253.3 lb

## 2012-07-26 DIAGNOSIS — I259 Chronic ischemic heart disease, unspecified: Secondary | ICD-10-CM | POA: Diagnosis not present

## 2012-07-26 DIAGNOSIS — I10 Essential (primary) hypertension: Secondary | ICD-10-CM | POA: Diagnosis not present

## 2012-07-26 DIAGNOSIS — E119 Type 2 diabetes mellitus without complications: Secondary | ICD-10-CM | POA: Diagnosis not present

## 2012-07-26 DIAGNOSIS — E785 Hyperlipidemia, unspecified: Secondary | ICD-10-CM

## 2012-07-26 NOTE — Progress Notes (Addendum)
Colin Newman Date of Birth: Aug 27, 1933   History of Present Illness: Colin Newman is seen for yearly followup today. He has a history of coronary disease and had stenting of the proximal LAD in 2005 with a bare-metal stent. On followup today he reports he is doing well from a cardiac standpoint. He is really quite sedentary and reports that he cannot walk because of chronic knee and back problems. He has gained 10 pounds. He reports his blood sugars have been higher since he has been on Lipitor. His last A1c was 6.2%. He had a marked reduction in his LDL from 150 down to 57 with Lipitor. He reports his blood pressure has been doing well. He states that he wore a 24-hour blood pressure monitor and that all looked okay. Actually the 24-hour monitor showed a average systolic blood pressure of 156 during the day and 136 at night. He has been resistant to starting additional medication. He denies any chest pain or shortness of breath. Current Outpatient Prescriptions on File Prior to Visit  Medication Sig Dispense Refill  . aspirin 81 MG tablet Take 81 mg by mouth daily.        [name] atorvastatin (LIPITOR) 40 MG tablet Take 1 tablet (40 mg total) by mouth daily.  90 tablet  3  . Blood Glucose Monitoring Suppl (ONE TOUCH ULTRA SYSTEM KIT) W/DEVICE KIT 1 kit by Does not apply route once.  100 each  6  . Lancets (ONETOUCH ULTRASOFT) lancets One Touch  Delica lancets.  60 each  5  . Lancets MISC 1 Device by Does not apply route 1 day or 1 dose.      . metoprolol (LOPRESSOR) 100 MG tablet Take 1 tablet (100 mg total) by mouth 2 (two) times daily.  180 tablet  3  . nitroGLYCERIN (NITROSTAT) 0.4 MG SL tablet Place 1 tablet (0.4 mg total) under the tongue every 5 (five) minutes as needed for chest pain.  25 tablet  11  . Omega-3 Fatty Acids (FISH OIL) 1200 MG CAPS Take 1 capsule by mouth daily.        . saw palmetto 500 MG capsule Take 500 mg by mouth daily.        Allergies  Allergen Reactions  .  Lisinopril Hives, Itching and Cough    Past Medical History  Diagnosis Date  . Hypertension     with satisfactory control  . Single vessel coronary disease     s/p stent LAD 2005  . Obesity   . Glucose intolerance (pre-diabetes)     Probably   . Dyslipidemia     Hx with intolerance to statin therapy  . Constipation     Past Surgical History  Procedure Date  . Other surgical history     Stent in the proximal left anterior descending in 2005  . Bilateral knee arthroscopy   . Back surgery     2 previous  . Cardiovascular stress test 07-20-2009    EF 73%    History  Smoking status  . Former Smoker  Smokeless tobacco  . Not on file    History  Alcohol Use No    Rarely has a beer    Family History  Problem Relation Age of Onset  . Pulmonary embolism Mother   . Cancer Father     Review of Systems: As noted in history of present illness. All other systems were reviewed and are negative.  Physical Exam: BP 178/68  Pulse 75  Ht 6' (1.829 m)  Wt 114.896 kg (253 lb 4.8 oz)  BMI 34.35 kg/m2  SpO2 96% He is an obese white male in no acute distress.The patient is alert and oriented x 3.  The mood and affect are normal.  The skin is warm and dry.  Color is normal.  The HEENT exam reveals that the sclera are nonicteric.  The mucous membranes are moist.  The carotids are 2+ without bruits.  There is no thyromegaly.  There is no JVD.  The lungs are clear.  The chest wall is non tender.  The heart exam reveals a regular rate with a normal S1 and S2.  There are no murmurs, gallops, or rubs.  The PMI is not displaced.   Abdominal exam reveals good bowel sounds.  There is no guarding or rebound.  There is no hepatosplenomegaly or tenderness.  There are no masses.  Exam of the legs reveal no clubbing, cyanosis, or edema.  The legs are without rashes.  The distal pulses are intact.  Cranial nerves II - XII are intact.  Motor and sensory functions are intact.  The gait is  normal.  LABORATORY DATA: Lab Results  Component Value Date   CHOL 225* 06/23/2011   HDL 38* 06/23/2011   LDLCALC 153* 06/23/2011   LDLDIRECT 57 03/12/2012   TRIG 170* 06/23/2011   CHOLHDL 5.9 06/23/2011   ECG today demonstrates normal sinus rhythm first-degree AV block. It is otherwise normal.  Assessment / Plan: 1. Coronary disease status post stenting of the proximal LAD in 2005 with a bare-metal stent. His last stress test was in October of 2010. He has no active symptoms but is very sedentary. I recommended a followup LexiScan Myoview study.  2. Hypertension. Clearly he is not at goal for a diabetic with a goal systolic less than 130 and diastolic less than 80. I explained the importance of good risk factor modification to reduce his risk of recurrent cardiac or cerebral events. He will followup with his primary care for management of his risk factors.  3. Diabetes mellitus type 2.  4. Hyperlipidemia. LDL is markedly improved on Lipitor. Although Lipitor may be associated with some increase in his blood sugars it appears that his glycemic control has been adequate. There is still a significant risk reduction associated with statin therapy.

## 2012-07-26 NOTE — Patient Instructions (Signed)
We will schedule you for a nuclear stress test.  Continue your current medication   

## 2012-07-30 ENCOUNTER — Ambulatory Visit (HOSPITAL_COMMUNITY): Payer: Medicare Other | Attending: Cardiovascular Disease | Admitting: Radiology

## 2012-07-30 VITALS — BP 128/60 | Ht 72.0 in | Wt 251.0 lb

## 2012-07-30 DIAGNOSIS — I10 Essential (primary) hypertension: Secondary | ICD-10-CM | POA: Diagnosis not present

## 2012-07-30 DIAGNOSIS — I259 Chronic ischemic heart disease, unspecified: Secondary | ICD-10-CM

## 2012-07-30 DIAGNOSIS — I2510 Atherosclerotic heart disease of native coronary artery without angina pectoris: Secondary | ICD-10-CM

## 2012-07-30 DIAGNOSIS — R079 Chest pain, unspecified: Secondary | ICD-10-CM | POA: Diagnosis not present

## 2012-07-30 DIAGNOSIS — E785 Hyperlipidemia, unspecified: Secondary | ICD-10-CM

## 2012-07-30 DIAGNOSIS — E119 Type 2 diabetes mellitus without complications: Secondary | ICD-10-CM | POA: Insufficient documentation

## 2012-07-30 MED ORDER — TECHNETIUM TC 99M SESTAMIBI GENERIC - CARDIOLITE
11.0000 | Freq: Once | INTRAVENOUS | Status: AC | PRN
  Administered 2012-07-30: 11 via INTRAVENOUS

## 2012-07-30 MED ORDER — REGADENOSON 0.4 MG/5ML IV SOLN
0.4000 mg | Freq: Once | INTRAVENOUS | Status: AC
  Administered 2012-07-30: 0.4 mg via INTRAVENOUS

## 2012-07-30 MED ORDER — TECHNETIUM TC 99M SESTAMIBI GENERIC - CARDIOLITE
33.0000 | Freq: Once | INTRAVENOUS | Status: AC | PRN
  Administered 2012-07-30: 33 via INTRAVENOUS

## 2012-07-30 NOTE — Progress Notes (Signed)
[facility] SITE 3 NUCLEAR MED [phone] [place] [address] [phone]  Cardiology Nuclear Med Study  Colin Newman is a 78 y.o. male     MRN : [id number]     DOB: [birth year]  Procedure Date: 10/24/202013  Nuclear Med Background Indication for Stress Test:  Evaluation for Ischemia and Stent Patency History:  '05 Heart Cath: EF: 55-60% stent-LAD, '10: MPS: EF: 73% NL Cardiac Risk Factors: History of Smoking, Hypertension, Lipids, NIDDM and Obesity  Symptoms:  Chest Pain   Nuclear Pre-Procedure Caffeine/Decaff Intake:  None NPO After: 8:00pm   Lungs:  clear O2 Sat: 95% on room air. IV 0.9% NS with Angio Cath:  20g  IV Site: R Antecubital  IV Started by:  Stanton Kidney, EMT-P  Chest Size (in):  44 Cup Size: n/a  Height: 6' (1.829 m)  Weight:  251 lb (113.853 kg)  BMI:  Body mass index is 34.04 kg/(m^2). Tech Comments:  Meds were not taken this am, per patient.    Nuclear Med Study 1 or 2 day study: 1 day  Stress Test Type:  Eugenie Birks  Reading MD: Charlton Haws, MD  Order Authorizing Provider:  P.Jordan MD  Resting Radionuclide: Technetium 91m Sestamibi  Resting Radionuclide Dose: 11.0 mCi   Stress Radionuclide:  Technetium 89m Sestamibi  Stress Radionuclide Dose: 33.0 mCi           Stress Protocol Rest HR: 64 Stress HR: 95  Rest BP: 128/60 Stress BP: 145/42  Exercise Time (min): n/a METS: n/a   Predicted Max HR: 143 bpm % Max HR: 66.43 bpm Rate Pressure Product: 24401   Dose of Adenosine (mg):  n/a Dose of Lexiscan: 0.4 mg  Dose of Atropine (mg): n/a Dose of Dobutamine: n/a mcg/kg/min (at max HR)  Stress Test Technologist: Milana Na, EMT-P  Nuclear Technologist:  Doyne Keel, CNMT     Rest Procedure:  Myocardial perfusion imaging was performed at rest 45 minutes following the intravenous administration of Technetium 66m Sestamibi. Rest ECG: NSR - Normal EKG  Stress Procedure:  The patient received IV Lexiscan 0.4 mg  over 15-seconds.  Technetium 48m Sestamibi injected at 30-seconds.  There were no significant changes and feels weird with Lexiscan.  Quantitative spect images were obtained after a 45 minute delay. Stress ECG: No significant change from baseline ECG  QPS Raw Data Images:  Normal; no motion artifact; normal heart/lung ratio. Stress Images:  Normal homogeneous uptake in all areas of the myocardium. Rest Images:  Normal homogeneous uptake in all areas of the myocardium. Subtraction (SDS):  Normal Transient Ischemic Dilatation (Normal <1.22):  1.11 Lung/Heart Ratio (Normal <0.45):  0.35   Quantitative Gated Spect Images QGS EDV:  101 ml QGS ESV:  32 ml  Impression Exercise Capacity:  Lexiscan with no exercise. BP Response:  Normal blood pressure response. Clinical Symptoms:  No significant symptoms noted. ECG Impression:  No significant ST segment change suggestive of ischemia. Comparison with Prior Nuclear Study: No images to compare  Overall Impression:  Normal stress nuclear study.  LV Ejection Fraction: 69%.  LV Wall Motion:  NL LV Function; NL Wall Motion     Charlton Haws

## 2012-08-08 ENCOUNTER — Encounter: Payer: Self-pay | Admitting: *Deleted

## 2012-08-21 ENCOUNTER — Encounter: Payer: Self-pay | Admitting: Family Medicine

## 2012-08-21 ENCOUNTER — Ambulatory Visit (INDEPENDENT_AMBULATORY_CARE_PROVIDER_SITE_OTHER): Payer: Medicare Other | Admitting: Family Medicine

## 2012-08-21 ENCOUNTER — Ambulatory Visit
Admission: RE | Admit: 2012-08-21 | Discharge: 2012-08-21 | Disposition: A | Discharge status: Home / Self Care | Payer: Medicare Other | Source: Ambulatory Visit | Attending: Family Medicine | Admitting: Family Medicine

## 2012-08-21 VITALS — BP 146/74 | HR 67 | Ht 72.0 in | Wt 254.0 lb

## 2012-08-21 DIAGNOSIS — R0789 Other chest pain: Secondary | ICD-10-CM

## 2012-08-21 DIAGNOSIS — Z87891 Personal history of nicotine dependence: Secondary | ICD-10-CM | POA: Diagnosis not present

## 2012-08-21 DIAGNOSIS — E119 Type 2 diabetes mellitus without complications: Secondary | ICD-10-CM | POA: Diagnosis not present

## 2012-08-21 DIAGNOSIS — I10 Essential (primary) hypertension: Secondary | ICD-10-CM

## 2012-08-21 DIAGNOSIS — R071 Chest pain on breathing: Secondary | ICD-10-CM

## 2012-08-21 DIAGNOSIS — R079 Chest pain, unspecified: Secondary | ICD-10-CM | POA: Diagnosis not present

## 2012-08-21 LAB — CBC
HCT: 42.9 % (ref 39.0–52.0)
Platelets: 175 10*3/uL (ref 150–400)
RBC: 4.69 MIL/uL (ref 4.22–5.81)
RDW: 13.5 % (ref 11.5–15.5)
WBC: 5.4 10*3/uL (ref 4.0–10.5)

## 2012-08-21 LAB — BASIC METABOLIC PANEL
CO2: 26 mEq/L (ref 19–32)
Chloride: 103 mEq/L (ref 96–112)
Creat: 1.03 mg/dL (ref 0.50–1.35)
Glucose, Bld: 202 mg/dL — ABNORMAL HIGH (ref 70–99)

## 2012-08-21 LAB — LIPID PANEL
HDL: 32 mg/dL — ABNORMAL LOW (ref >39)
LDL Cholesterol: 46 mg/dL (ref 0–99)
Total CHOL/HDL Ratio: 3.7 Ratio

## 2012-08-21 LAB — POCT GLYCOSYLATED HEMOGLOBIN (HGB A1C): Hemoglobin A1C: 8.4

## 2012-08-21 MED ORDER — LIDOCAINE 5 % EX PTCH: 1.0000 | MEDICATED_PATCH | CUTANEOUS | Status: DC

## 2012-08-21 MED ORDER — AMLODIPINE BESYLATE 2.5 MG PO TABS: 2.5000 mg | ORAL_TABLET | Freq: Every day | ORAL | Status: DC

## 2012-08-21 NOTE — Progress Notes (Signed)
  Subjective:    Patient ID: Colin Newman, male    DOB: [birth year], 78 y.o.   MRN: [id number]  HPI Here for follow up of a few issues: 1. Intermittent L sided chest wall pain for the past 4 weeks; worse with cough or sneezing.  Not reproducible with palpation.  No trauma.  Does not have a cough; has not been sick recently. Better with Aleve for short periods. Recalls its onset after a "pill caught in throat"   2. HTN:  Has been on several antihypertensives in the past to which he claims intolerance.  Cannot remember the intolerance to Norvasc at this time.  He has a true allergy to ACEI, observed hives with these.   Denies dyspnea; recent Myoview with Dr [place] reviewed in EMR.  3. Concerns about worsening control of DM.  He appears somewhat fatalistic about poor control:  "When you're 79 and live alone, your diet is going to be bad."  Concerns about taking more meds, but also resistance to diet/lifestyle changes.  Believes the Lipitor is making his glucose harder to control.  Review of Systems See above    Objective:   Physical Exam Well appearing, no apparent distress HEENT neck supple, No cervical adenopathy.  COR Regular S1S2' no extra sounds  Chest wall without visible skin changes.  No ecchymosis.  No reproducible tenderness with palpation along L pectoralis or mid-axillary line.  Full active ROM L shoulder.  PULM Clear bilaterally, no rales or wheezes LEs No foot lesions noted, no interdigitary macerations/skin breakdown.        Assessment & Plan:

## 2012-08-21 NOTE — Patient Instructions (Addendum)
It was good to see you today.  For the chest wall pain, I am sending you for xrays today.  I will call your home with the results when I see them. I recommend that you use a rib girdle or rib belt to provide support to your chest wall.  The Aleve, used periodically, can be helpful.  Also, Lidoderm patches applied for 12 hours at a time to the affected area.   APPOINTMENT TO SEE SUZANNE LINEBERRY AND FOLLOW UP IN 2 MONTHS WITH DR Mauricio Po

## 2012-08-21 NOTE — Assessment & Plan Note (Signed)
Discussed need for lifestyle interventions and weight loss; he is frustrated by medications.  Believes his lipitor is contributing to his high sugars.  Of note, his LDL has done very well with Lipitor and is at goal.  Re-evaluate after Health Coach intervention.

## 2012-08-21 NOTE — Assessment & Plan Note (Signed)
Patient with chest wall pain that is related to cough/sneeze; he recently had Myoview with was reassuring and does not support cardiac ischemia as a cause.  For CXR and rib films; he has a distant smoking history, quitting in the 1960s.  Denies cough or sputum production or dypsnea at this time.  Will call him with the results. Rib girdle, heat, rest.

## 2012-08-21 NOTE — Assessment & Plan Note (Signed)
We talked about his frustration with BP control and recommendation for more medications.  He has gained weight recently.  Raises concerns about possible mood disorder, especially living alone and with resistance to changes being recommended.  I have recommended our Health Coach to him; he reluctantly agrees to meet with Rosalita Chessman and discuss.  Will follow up after his visit with Rosalita Chessman.  Will restart Norvasc at lower dose (2.5mg ) daily to see if he can tolerate.

## 2012-08-22 ENCOUNTER — Telehealth: Payer: Self-pay | Admitting: Family Medicine

## 2012-08-22 MED ORDER — METFORMIN HCL 500 MG PO TABS: 500.0000 mg | ORAL_TABLET | Freq: Every day | ORAL | Status: DC

## 2012-08-22 NOTE — Telephone Encounter (Signed)
Called Colin Newman and discussed his xray findings and labs.  He is decided he wants to start dieting and lose weight, improve his DM control without adding more medications.  We discussed the LDL as being under excellent control; need for better BP control and now to lower his A1C as well.  He has never been on metformin.  Discussed this at length.  Will start slow, at 500mg  daily, given his history of intolerance of multiple medications.  He is interested in meeting with Arlys John for Health Coaching regarding weight loss and DM control; I will ask that she or staff contact him to schedule an appointment.  He will schedule with me in 4 to 6 weeks and bring his glucometer readings (he checks CBGs at home).  Also, he needs renewal of his permanent handicapped parking placard, based on the fact that he has arthritic condition of knees and back and thus cannot ambulate more than 100 yards without stopping.    Paula Compton, MD

## 2012-08-23 NOTE — Telephone Encounter (Signed)
Schedule appointment for 08/30/12 while PCP is precepting.

## 2012-08-23 NOTE — Progress Notes (Signed)
Schedule health coaching appointment for 08/30/12.

## 2012-08-30 ENCOUNTER — Ambulatory Visit (INDEPENDENT_AMBULATORY_CARE_PROVIDER_SITE_OTHER): Payer: Medicare Other | Admitting: Home Health Services

## 2012-08-30 ENCOUNTER — Encounter: Payer: Self-pay | Admitting: Home Health Services

## 2012-08-30 VITALS — BP 148/79 | HR 82 | Temp 97.4°F | Ht 72.0 in | Wt 252.5 lb

## 2012-08-30 DIAGNOSIS — E669 Obesity, unspecified: Secondary | ICD-10-CM | POA: Insufficient documentation

## 2012-08-30 DIAGNOSIS — E119 Type 2 diabetes mellitus without complications: Secondary | ICD-10-CM

## 2012-08-30 NOTE — Assessment & Plan Note (Signed)
Is tolerating metformin well; is interested in reducing pill burden by weight loss and dietary changes.  Plans to continue with Health Coach for ongoing support.  To see back in 4 weeks.  Paula Compton, MD

## 2012-08-30 NOTE — Assessment & Plan Note (Signed)
Assessment:  No change  Plan:  Continued counseling with health coach.  Set goal to exercise using stationary bike 5 x a week for 30 minutes and to start eating 1 fruit and 1 vegetable a day.  Pt may request appointment with dietician down the road for specific meal plans.

## 2012-08-30 NOTE — Patient Instructions (Signed)
1. Use stationary bike 5 times a week for 30 minutes.  Between 2-4 pm daily while watching ([place], [employer], or [employer]). 2. Try eating 1 fruit and 1 vegetable a day for next week. 3. Start keeping record of blood pressure and blood sugar numbers. 4. Follow up weekly with Rosalita Chessman for support. [phone] 5. Consider getting a physical therapy evaluation for foot to avoid fall risk.

## 2012-08-30 NOTE — Progress Notes (Signed)
  Subjective:    Patient ID: Colin Newman, male    DOB: 02-05-1933, 78 y.o.   MRN: [id number]  HPI DIABETES Disease Monitoring: Blood Sugar ranges-140-200 Polyuria/phagia/dipsia- no      Visual problems- no  Medications: Compliance- pt reports taking medications daily without missing any days Hypoglycemic symptoms- no  OBESITY Current weight/BMI : Body mass index is 34.25 kg/(m^2).    How long have been obese:  10+ years Course:  No change Problems or symptoms it causes:  DM, HTN   Things have tried to improve:  Low carb diets 6 years ago  Patient Identified Concern:  High DM, HTN, lipid numbers Stage of Change Patient Is In:  Contemplation: willing to make changes in next 6 months. Patient Reported Barriers:  He is older and doesn't want to make BIG changes Patient Reported Perceived Benefits:  Better health, not taking pills, living longer Patient Reports Self-Efficacy:   Pt displays some self-efficacy from past successes. Behavior Change Supports:  To be determined- lives alone did not mention large support group Goals:  Use stationary bike 30 minutes 5x a week, eat 1 vegetable and 1 fruit a day, continue weekly with health coach.  Patient Education:  We talked about types of foods that raise blood sugar (carbs/sweets). We talked about importance of vegetables/fruits in diet, eating throughout the day, increasing physical activities, fall risk, what good blood pressure goals would be and ways to manage anxiety/stress.   Has been taking the metformin 500mg  daily, thus far he is tolerating well.  No GI symptoms or complaints. JB  FALL RISK  Pt reports 3 falls in the past year with injuries.  Pt reports right foot is flat and is what is causing him to fall.  Pt reports falling outside. Pt reports using cane when walking at times.  Pt lives alone. Recommended PT screening to avoid future falls.  Pt will consider.   Discussed interventions that may help prevent falls, such as PT/OT  evaluation for DME that can help improve his function; he is reluctant to engage on this topic for the time being. JB  Review of Systems     Objective:   Physical Exam  No acute distress.       Assessment & Plan:

## 2012-09-05 ENCOUNTER — Telehealth: Payer: Self-pay | Admitting: Home Health Services

## 2012-09-05 NOTE — Telephone Encounter (Signed)
Spoke with Colin Newman.  Pt reports taking medications daily without missing any days.  Pt reported bp values as follows: 132/68, 144/69, 138/66, 138/65, 146/78 Pt reported bglucose values as follows: 4 hours after eating 125 & 128.  1 hour after eating 134.  Pt reports using stationary bike for 15 minutes everyday this past week. Pt reports eating fruits and vegetables every day for past week.  Pt reports feeling good over all and will continue with the same goals for the next week.  Pt's overall goal is weight loss, bp/bs management.

## 2012-09-11 ENCOUNTER — Telehealth: Payer: Self-pay | Admitting: Home Health Services

## 2012-09-11 NOTE — Telephone Encounter (Signed)
Spoke with Colin Newman.  Pt reports taking medications daily without missing any days/  Pt reports bp average around 140-70.  Pt reports blood sugars running "high" but did not give any specific numbers.  Pt reports using exercise bike 15 minutes every day of the week.  Pt reports eating between 3-4 fruits and vegetables a day.  Pt self reports a weight loss of 5 lbs over past 2 weeks.  Pt will continue with same goals starting Friday after thanksgiving.  Pt's overall goal is weight loss, bp/dm management.

## 2012-09-19 ENCOUNTER — Telehealth: Payer: Self-pay | Admitting: Home Health Services

## 2012-09-19 NOTE — Telephone Encounter (Signed)
Spoke with Molly Maduro.  Pt reports taking medications daily as prescribed. At home bp values: 142/76, 143/77, 132/65, 163/73  At home bs values: 133, 111, 177  Pt reports using stationary bike 15-30 minutes every day.  Pt reports not eating red meat, has increased daily fruits/vegetables.  Pt reports feeling well.   Pt expressed concern with having to make hard changes because he believes he will die one day regardless of what changes he makes.  After reviewing, pts concerned it was clarified that he still believes he will die one day regardless of changes but that he would like to improve his quality of life during what time is remaining.  This is his motivation for eating healthier and moving more now.  Pt self reported an 8 lb weight loss since last office visit.  Pt set goals to bike daily 15-30 minutes, continue to eat fruits/vegetables.  Limit sweets.  Pt's overall goal is weight loss, bp/bs management.

## 2012-11-12 ENCOUNTER — Other Ambulatory Visit: Payer: Self-pay | Admitting: Family Medicine

## 2012-12-19 ENCOUNTER — Other Ambulatory Visit: Payer: Self-pay | Admitting: Family Medicine

## 2013-01-03 ENCOUNTER — Telehealth: Payer: Self-pay | Admitting: Home Health Services

## 2013-01-03 ENCOUNTER — Encounter: Payer: Self-pay | Admitting: Family Medicine

## 2013-01-03 ENCOUNTER — Ambulatory Visit (INDEPENDENT_AMBULATORY_CARE_PROVIDER_SITE_OTHER): Payer: Medicare Other | Admitting: Family Medicine

## 2013-01-03 VITALS — BP 150/74 | HR 73 | Wt 232.6 lb

## 2013-01-03 DIAGNOSIS — E669 Obesity, unspecified: Secondary | ICD-10-CM

## 2013-01-03 DIAGNOSIS — I10 Essential (primary) hypertension: Secondary | ICD-10-CM | POA: Diagnosis not present

## 2013-01-03 DIAGNOSIS — E785 Hyperlipidemia, unspecified: Secondary | ICD-10-CM

## 2013-01-03 DIAGNOSIS — L570 Actinic keratosis: Secondary | ICD-10-CM

## 2013-01-03 DIAGNOSIS — E119 Type 2 diabetes mellitus without complications: Secondary | ICD-10-CM | POA: Diagnosis not present

## 2013-01-03 LAB — POCT GLYCOSYLATED HEMOGLOBIN (HGB A1C): Hemoglobin A1C: 6.7

## 2013-01-03 MED ORDER — METFORMIN HCL 500 MG PO TABS: ORAL_TABLET | ORAL | Status: DC

## 2013-01-03 MED ORDER — ATORVASTATIN CALCIUM 40 MG PO TABS: 40.0000 mg | ORAL_TABLET | Freq: Every day | ORAL | Status: DC

## 2013-01-03 MED ORDER — AMLODIPINE BESYLATE 5 MG PO TABS: 5.0000 mg | ORAL_TABLET | Freq: Every day | ORAL | Status: DC

## 2013-01-03 MED ORDER — METOPROLOL TARTRATE 100 MG PO TABS: 100.0000 mg | ORAL_TABLET | Freq: Two times a day (BID) | ORAL | Status: DC

## 2013-01-03 NOTE — Assessment & Plan Note (Signed)
LDL cholesterol has been well controlled, continues on Lipitor 40mg  daily.  Plan to continue for now. Discussed plaque stabilization.  Continue ASA 81mg  daily.

## 2013-01-03 NOTE — Assessment & Plan Note (Signed)
Right forearm lesion that is most consistent (clinically) with AK.  Shave biopsy removed today.  Await path report to decide whether further curettage of the lesion is necessary.

## 2013-01-03 NOTE — Assessment & Plan Note (Signed)
Continues with hypertension.  Caution about overtreatment in 79 year-old man.  WIll increase amlodipine to 5mg  daily; recheck in RN visit in the coming month or so.

## 2013-01-03 NOTE — Assessment & Plan Note (Signed)
Marked improvement in his weight, as well as DM control.  To continue with diet and exercise.  He reports that he does not feel he needs the help of the Health Coach at this time.

## 2013-01-03 NOTE — Patient Instructions (Addendum)
It was great to see you today.  Your Hemoglobin A1C (measure of diabetes) is down to 6.7%-- down from 8.4% in mid-November 2013.  Congratulations!!!  Likewise, your weight of 232 lbs today is 20 lbs less than at your last visit (252lbs in mid-November).  I refilled your Metformin 500mg  one time daily (for diabetes), your metoprolol 100mg  twice daily (for blood pressure), and your atorvastatin 40mg  daily (for cholesterol/plaque).  I increased your amlodipine from 2.5 to 5mg  daily.  IF YOU HAVE THE 2.5MG  TABS AT HOME, PLEASE TAKE 2 TABS DAILY UNTIL DONE.  THE NEW PRESCRIPTION HAS 5MG  TABLETS, SO TAKE ONLY ONE TAB/DAY OF THE NEW ONES.  I took a biopsy of the skin lesion of your right forearm; I will call you when the results come in.   Nurse visit for blood pressure check in 1 month.  Office visit with Dr Mauricio Po in 3 months.

## 2013-01-03 NOTE — Assessment & Plan Note (Signed)
Well controlled, with A1C down to 6.7% from previous reading of 8.4%.  Continue lifestyle modification as he is doing.

## 2013-01-03 NOTE — Progress Notes (Signed)
  Subjective:    Patient ID: ZHYON Colin Newman, male    DOB: 01-May-1933, 79 y.o.   MRN: [id number]  HPI Colin Colin Newman returns for follow up.  He has lost 20 lbs since his last visit in mid-November.  He credits this to changes in diet, exercising on his stationary bike for five times a week. Feels very well.  More positive.  Stopped fast foods and most sweets.   His prior A1C on Aug 21, 2012 was 8.4%.  Today it is 6.7%. Continues to take Metformin 500mg  daily.  Also atorvastatin 40mg  daily.  Baby aspirin every day.   Tolerating the amlodipine 2.5mg  daily without leg swelling.   Concern about a dark mole on right forearm that he says has been getting bigger.    Review of Systems No chest pain, no fevers/chills, no shortnes of breath.  Good exercise tolerance. POsitive mood. No leg swelling.    Objective:   Physical Exam Well appearing, no apparent distress HEENT Neck supple, no cervical adenopathy COR Regular S1S2 no extra sounds PULM Clear bilaterally, no rales or wheezes SKIN 38mmx8mm hyperpigmented macule on volar surface of right forearm.  Slightly raised. LEs; No ankle edema noted.   PROCEDURE NOTE:  Shave biopsy of hyperpigmented lesion on volar surface of R forearm.  Counseled as to the risks and benefits of removal by shave biopsy, including injury, infection and bleeding.  He gives verbal and written consent to proceed after being given opportunity to ask questions.  Area prepped in clean fashion; infiltrated with 1% lidocaine WITH epinephrine.  Dermablade shave biopsy removed and placed in formalin.  EBL zero.  Well tolerated.        Assessment & Plan:

## 2013-01-03 NOTE — Telephone Encounter (Signed)
Spoke with Colin Newman.  Congratulated pt on weight loss and lowering of A1C.  Pt reports feeling well and was enthusiatic about his success.  Pt would like to continue with health coaching.  Pt set goals to exercise on bike 5x this next week and to continue to limit carbs in his daily diet.  Pt's overall goal is weight loss, dm management, and eventually to stop taking medications.

## 2013-01-08 ENCOUNTER — Telehealth: Payer: Self-pay | Admitting: Family Medicine

## 2013-01-08 NOTE — Telephone Encounter (Signed)
Called patient to report results of derm path report, which reported the lesion to be a seborrheic keratosis.  No further treatment is necessary unless he develops more lesions, recurrence of the same lesion, or other skin changes/complaints.  Paula Compton, MD

## 2013-07-30 ENCOUNTER — Ambulatory Visit: Payer: Medicare Other | Admitting: Cardiology

## 2013-09-17 ENCOUNTER — Ambulatory Visit (INDEPENDENT_AMBULATORY_CARE_PROVIDER_SITE_OTHER): Payer: Medicare Other | Admitting: Cardiology

## 2013-09-17 ENCOUNTER — Encounter: Payer: Self-pay | Admitting: Cardiology

## 2013-09-17 VITALS — BP 160/68 | HR 73 | Ht 72.0 in | Wt 239.8 lb

## 2013-09-17 DIAGNOSIS — H179 Unspecified corneal scar and opacity: Secondary | ICD-10-CM | POA: Diagnosis not present

## 2013-09-17 DIAGNOSIS — I10 Essential (primary) hypertension: Secondary | ICD-10-CM | POA: Diagnosis not present

## 2013-09-17 DIAGNOSIS — E785 Hyperlipidemia, unspecified: Secondary | ICD-10-CM

## 2013-09-17 DIAGNOSIS — E119 Type 2 diabetes mellitus without complications: Secondary | ICD-10-CM

## 2013-09-17 DIAGNOSIS — I259 Chronic ischemic heart disease, unspecified: Secondary | ICD-10-CM | POA: Diagnosis not present

## 2013-09-17 MED ORDER — HYDROCHLOROTHIAZIDE 25 MG PO TABS: 25.0000 mg | ORAL_TABLET | Freq: Every day | ORAL | Status: DC

## 2013-09-17 NOTE — Patient Instructions (Signed)
Continue your current therapy  Add HCTZ 25 mg daily for blood pressure  You should follow up with Dr. Mauricio Po in 1-2 months  I will see you in one year.

## 2013-09-17 NOTE — Progress Notes (Signed)
Colin Newman Date of Birth: Oct 02, 1933   History of Present Illness: Colin Newman is seen for yearly followup today. He has a history of coronary disease and had stenting of the proximal LAD in 2005 with a bare-metal stent. On followup today he states he is doing well. His walking is limited by arthritis but he does do some yard work and rides a stationary bike. He has lost 14 pounds since his last visit. His blood pressure has been consistently elevated at 170 systolic. He reports his blood sugars have been doing well. His last Myoview study in October 2013 was normal. He denies any significant chest pain or shortness of breath. He has no edema. He denies any palpitations.  Current Outpatient Prescriptions on File Prior to Visit  Medication Sig Dispense Refill  . amLODipine (NORVASC) 5 MG tablet Take 1 tablet (5 mg total) by mouth daily.  90 tablet  3  . aspirin 81 MG tablet Take 81 mg by mouth daily.        [name] atorvastatin (LIPITOR) 40 MG tablet Take 1 tablet (40 mg total) by mouth daily.  90 tablet  3  . Blood Glucose Monitoring Suppl (ONE TOUCH ULTRA SYSTEM KIT) W/DEVICE KIT 1 kit by Does not apply route once.  100 each  6  . Lancets MISC 1 Device by Does not apply route 1 day or 1 dose.      . metFORMIN (GLUCOPHAGE) 500 MG tablet TAKE ONE TABLET BY MOUTH EVERY DAY WITH BREAKFAST  90 tablet  3  . metoprolol (LOPRESSOR) 100 MG tablet Take 1 tablet (100 mg total) by mouth 2 (two) times daily.  180 tablet  3  . Multiple Vitamin (MULTI-VITAMIN DAILY PO) Take 1 tablet by mouth 1 day or 1 dose.      . nitroGLYCERIN (NITROSTAT) 0.4 MG SL tablet Place 1 tablet (0.4 mg total) under the tongue every 5 (five) minutes as needed for chest pain.  25 tablet  11  . Omega-3 Fatty Acids (FISH OIL) 1200 MG CAPS Take 1 capsule by mouth daily.        . ONE TOUCH ULTRA TEST test strip USE TO TEST BLOOD GLUCOSE ONCE DAILY  100 each  5  . saw palmetto 500 MG capsule Take 500 mg by mouth daily.       No current  facility-administered medications on file prior to visit.    Allergies  Allergen Reactions  . Lisinopril Hives, Itching and Cough    Past Medical History  Diagnosis Date  . Hypertension   . Single vessel coronary disease     s/p stent LAD 2005  . Obesity   . Glucose intolerance (pre-diabetes)     Probably   . Dyslipidemia     Hx with intolerance to statin therapy  . Constipation     Past Surgical History  Procedure Laterality Date  . Other surgical history      Stent in the proximal left anterior descending in 2005  . Bilateral knee arthroscopy    . Back surgery      2 previous  . Cardiovascular stress test  07-20-2009    EF 73%    History  Smoking status  . Former Smoker  Smokeless tobacco  . Not on file    History  Alcohol Use No    Comment: Rarely has a beer    Family History  Problem Relation Age of Onset  . Pulmonary embolism Mother   . Cancer Father  Review of Systems: As noted in history of present illness. All other systems were reviewed and are negative.  Physical Exam: BP 160/68  Pulse 73  Ht 6' (1.829 m)  Wt 239 lb 12.8 oz (108.773 kg)  BMI 32.52 kg/m2 He is an obese white male in no acute distress. The HEENT exam is normal. The carotids are 2+ without bruits.  There is no thyromegaly.  There is no JVD.  The lungs are clear.    The heart exam reveals a regular rate with a normal S1 and S2.  There are no murmurs, gallops, or rubs.  The PMI is not displaced.   Abdominal exam reveals good bowel sounds.  There is no guarding or rebound.  There is no hepatosplenomegaly or tenderness.  There are no masses.  Exam of the legs reveal no clubbing, cyanosis, or edema.  The legs are without rashes.  The distal pulses are intact.  Cranial nerves II - XII are intact.  Motor and sensory functions are intact.  The gait is normal.  LABORATORY DATA: Lab Results  Component Value Date   CHOL 118 08/21/2012   HDL 32* 08/21/2012   LDLCALC 46 08/21/2012    LDLDIRECT 57 03/12/2012   TRIG 200* 08/21/2012   CHOLHDL 3.7 08/21/2012   ECG today demonstrates normal sinus rhythm with first degree AV block. Rate is 73 beats per minute. It is otherwise normal.  Assessment / Plan: 1. Coronary disease status post stenting of the proximal LAD in 2005 with a bare-metal stent. Normal Myoview study in October 2013. He is asymptomatic. We will continue with his medical management. I'll followup again in one year.  2. Hypertension. Poorly controlled. I recommended the addition of HCTZ 25 mg daily to his current dose of metoprolol and amlodipine. He has been intolerant of ACE inhibitors in the past with cough and hives. I recommended that he followup with his primary care in the next one to 2 months to reassess his blood pressure control and to assess his potassium level.  3. Diabetes mellitus type 2.  4. Hyperlipidemia. Continue statin therapy.

## 2013-10-28 ENCOUNTER — Encounter: Payer: Self-pay | Admitting: Family Medicine

## 2013-10-28 ENCOUNTER — Ambulatory Visit (INDEPENDENT_AMBULATORY_CARE_PROVIDER_SITE_OTHER): Payer: Medicare Other | Admitting: Family Medicine

## 2013-10-28 VITALS — BP 138/68 | HR 63 | Ht 72.0 in | Wt 233.0 lb

## 2013-10-28 DIAGNOSIS — H9190 Unspecified hearing loss, unspecified ear: Secondary | ICD-10-CM

## 2013-10-28 DIAGNOSIS — H6980 Other specified disorders of Eustachian tube, unspecified ear: Secondary | ICD-10-CM | POA: Diagnosis not present

## 2013-10-28 MED ORDER — GLUCOSE BLOOD VI STRP: ORAL_STRIP | Status: DC

## 2013-10-28 MED ORDER — ONETOUCH ULTRASOFT LANCETS MISC: Status: DC

## 2013-10-28 MED ORDER — PREDNISONE 20 MG PO TABS: 20.0000 mg | ORAL_TABLET | Freq: Every day | ORAL | Status: DC

## 2013-10-28 NOTE — Patient Instructions (Signed)
It was a pleasure to see you today.   For your itching and the difficulty with hearing from your left ear, I am prescribing prednisone 20mg  tablets one tablet in the morning with breakfast.   I would like you to hold the HCTZ blood pressure medicine until I see you next week, to see if the itching (eyes, ears) is related.   I would like you back in 1 week; if your hearing is not better, we will discuss referral to ENT.  We will check the hemoglobin A1C at your next visit.

## 2013-10-28 NOTE — Progress Notes (Signed)
   Subjective:    Patient ID: Colin Newman, male    DOB: 01-16-1933, 80 y.o.   MRN: [id number]  HPI Patient here for complaint of decreased auditory perception from left ear that has presented over the past few days; no associated pain or tinnitus. No otorrhea.  "Feels like water in the ear". Also with complaint of pruritus in EAC bilat, eyes bilat, and face/nose.  Associates this with recent start of HCTZ by his cardiologist.  Is not aware of previous sulfa allergy, but has had significant allergy (urticaria) with ACEI.   Denies chest pain or shortness of breath, no fevers/chills. Declines offer/recommendation for influenza vaccine.    Patient asks for refills of his DM testing supplies; has checked on occasion, with fasting glucoses in the 120 range.  Review of Systems See above    Objective:   Physical Exam Well appearing, no apparent distress HEENT neck supple. TMs clear bilaterally with good cone of light bilat. No periauricular adenopathy. No cervical or supraclavicular adenopathy. Nasal mucosa mildly injected. Injected oropharynx. No frontal or maxillary sinus tenderness.  Rinne (512 Hz) with AC>BC bilat.  Weber test lateralizes to RIGHT ear.  COR Regular S1S2 PULM Clear bilaterally SKIN No visible urticaria noted.        Assessment & Plan:

## 2013-11-04 ENCOUNTER — Ambulatory Visit (INDEPENDENT_AMBULATORY_CARE_PROVIDER_SITE_OTHER): Payer: Medicare Other | Admitting: Family Medicine

## 2013-11-04 ENCOUNTER — Encounter: Payer: Self-pay | Admitting: Family Medicine

## 2013-11-04 VITALS — BP 138/74 | HR 60 | Ht 72.0 in | Wt 237.0 lb

## 2013-11-04 DIAGNOSIS — E119 Type 2 diabetes mellitus without complications: Secondary | ICD-10-CM

## 2013-11-04 DIAGNOSIS — E785 Hyperlipidemia, unspecified: Secondary | ICD-10-CM

## 2013-11-04 DIAGNOSIS — H9190 Unspecified hearing loss, unspecified ear: Secondary | ICD-10-CM

## 2013-11-04 DIAGNOSIS — I10 Essential (primary) hypertension: Secondary | ICD-10-CM | POA: Diagnosis not present

## 2013-11-04 LAB — BASIC METABOLIC PANEL
BUN: 23 mg/dL (ref 6–23)
CHLORIDE: 96 meq/L (ref 96–112)
CO2: 31 mEq/L (ref 19–32)
Calcium: 8.8 mg/dL (ref 8.4–10.5)
Creat: 1.09 mg/dL (ref 0.50–1.35)
GLUCOSE: 248 mg/dL — AB (ref 70–99)
POTASSIUM: 4 meq/L (ref 3.5–5.3)
SODIUM: 134 meq/L — AB (ref 135–145)

## 2013-11-04 LAB — POCT GLYCOSYLATED HEMOGLOBIN (HGB A1C): Hemoglobin A1C: 7.6

## 2013-11-04 LAB — LDL CHOLESTEROL, DIRECT: LDL DIRECT: 66 mg/dL

## 2013-11-04 MED ORDER — GLUCOSE BLOOD VI STRP: ORAL_STRIP | Status: DC

## 2013-11-04 MED ORDER — ONETOUCH ULTRASOFT LANCETS MISC: Status: DC

## 2013-11-04 NOTE — Progress Notes (Signed)
   Subjective:    Patient ID: Colin Newman, male    DOB: 11/02/1932, 80 y.o.   MRN: [id number]  HPI  Here today for follow up of hearing loss that had been acute, worse in L than R ear.  No otorrhea, tinnitus or pain.  He has been taking prednisone burst for the past 1 week and reports almost complete resolution of the symptoms.  Also, generalized itch which he had attributed to HCTZ, is gone.  He feels well.   Here for follow up on diabetes.  The prescriptions for One Touch Ultra lancets and test strips were not accepted at [facility]. He checks sugars daily, but forgets sometimes.   S/p injury in his 85s, which left him with drop foot on the R, also decreased sensation in the R foot.     Review of Systems See above    Objective:   Physical Exam Well appearing, no apparent distress HEENT neck supple, no cervical adenopathy. TMs clean and clear, with good cones of light and no visible fluid behind TMs.  Clear oropharynx. No frontal or maxillary sinus tenderness bilaterally.  Weber testing SYMMETRIC; Rinne testing AC>BC bilaterally. EXTS: no erosions or skin lesions on feet.  No maceration of interdigitary skin.  Palpable dp pulses bilaterally. Monofilament testing with decreased sensation in BOTH FEET, generalized. Brisk cap refill in toes on both feet.        Assessment & Plan:

## 2013-11-04 NOTE — Assessment & Plan Note (Signed)
Almost completely resolved since last visit.  Plan to hold prednisone today.  Patient agrees that he is 90% better and does not see need for ENT referral at this time, but will contact me if worsening or new symptoms arise.

## 2013-11-04 NOTE — Patient Instructions (Signed)
It was a pleasure to see you today.  I am glad your hearing issue has nearly resolved!  I entered hydrochlorothiazide on your list of medication allergies, due to the itching you had associated with taking it.  I am checking the hemoglobin A1C, the kidney function, and the direct LDL cholesterol today.   Please stop taking the prednisone now.

## 2013-11-04 NOTE — Assessment & Plan Note (Signed)
Continue current meds. Check BMet, A1C and LDL-direct today.

## 2013-11-04 NOTE — Assessment & Plan Note (Signed)
To check direct LDL today; he ate this morning (cheese and crackers. )

## 2013-11-05 ENCOUNTER — Telehealth: Payer: Self-pay | Admitting: Family Medicine

## 2013-11-05 NOTE — Telephone Encounter (Signed)
Called patient to discuss results of labs.  His glucose level of 248 is likely influenced by recent burst prednisone for 7 days prior to lab.  He is off this now.  Plan to continue with current management and recheck A1c in another 4 months (early May). JB

## 2013-11-11 ENCOUNTER — Other Ambulatory Visit: Payer: Self-pay | Admitting: Family Medicine

## 2014-01-13 ENCOUNTER — Other Ambulatory Visit: Payer: Self-pay | Admitting: Family Medicine

## 2014-02-17 ENCOUNTER — Other Ambulatory Visit: Payer: Self-pay | Admitting: Family Medicine

## 2014-02-24 ENCOUNTER — Other Ambulatory Visit: Payer: Self-pay | Admitting: Family Medicine

## 2014-03-10 ENCOUNTER — Other Ambulatory Visit: Payer: Self-pay | Admitting: Family Medicine

## 2014-03-11 MED ORDER — METFORMIN HCL 500 MG PO TABS: ORAL_TABLET | ORAL | Status: DC

## 2014-03-11 MED ORDER — ATORVASTATIN CALCIUM 40 MG PO TABS: ORAL_TABLET | ORAL | Status: DC

## 2014-03-11 MED ORDER — AMLODIPINE BESYLATE 5 MG PO TABS: ORAL_TABLET | ORAL | Status: DC

## 2014-03-11 NOTE — Telephone Encounter (Signed)
Called patient to confirm that he is indeed taking metoprolol 100mg , which he has been taking continuously.  Prior CAD/PTCA.    Discussed other medication refills as well.   He has not been able to get the lancets for his One Touch Ultra glucometer.  (One Touch Delica did not work).  PAPER RX MADE FOR ONE TOUCH ULTRA LANCET FOR ONE-TIME DAILY CBG CHECKS, #100 WITH 11 RF, PLACED TO BE FAXED TO [facility] [address] [phone] (fax number).  Dalbert Mayotte, MD

## 2014-03-12 ENCOUNTER — Telehealth: Payer: Self-pay | Admitting: *Deleted

## 2014-03-12 NOTE — Telephone Encounter (Signed)
Received a fax from [facility] stating they need a new Rx for One Touch Ultra Lancets.  Need diagnosis code and prescribers signature.  Please fax new Rx to [facility] [phone].  Derl Barrow, RN

## 2014-06-19 ENCOUNTER — Ambulatory Visit (INDEPENDENT_AMBULATORY_CARE_PROVIDER_SITE_OTHER): Payer: Medicare Other | Admitting: Family Medicine

## 2014-06-19 ENCOUNTER — Encounter: Payer: Self-pay | Admitting: Family Medicine

## 2014-06-19 VITALS — BP 154/64 | HR 62 | Temp 98.1°F | Ht 72.0 in | Wt 239.3 lb

## 2014-06-19 DIAGNOSIS — E1165 Type 2 diabetes mellitus with hyperglycemia: Secondary | ICD-10-CM

## 2014-06-19 DIAGNOSIS — IMO0001 Reserved for inherently not codable concepts without codable children: Secondary | ICD-10-CM | POA: Diagnosis not present

## 2014-06-19 DIAGNOSIS — E119 Type 2 diabetes mellitus without complications: Secondary | ICD-10-CM | POA: Diagnosis present

## 2014-06-19 DIAGNOSIS — I10 Essential (primary) hypertension: Secondary | ICD-10-CM | POA: Diagnosis not present

## 2014-06-19 LAB — POCT GLYCOSYLATED HEMOGLOBIN (HGB A1C): HEMOGLOBIN A1C: 7.5

## 2014-06-19 NOTE — Assessment & Plan Note (Signed)
Blood pressure within 5 mmHg of target (SBP</=150). No changes in current regimen at this time. To continue on BB, ASA, atorvastatin.

## 2014-06-19 NOTE — Progress Notes (Signed)
   Subjective:    Patient ID: Colin Newman, male    DOB: [birth year], 80 y.o.   MRN: [id number]  HPI Patient here for follow up of DM.  He reports that he is feeling well, no complaints. Specifically, no chest pain, dyspnea, cough.  He does report that he has had a couple of episodes of instablility and mild falls without significant trauma.  He continues to perform all his own ADLs. Lives alone. Denies symptoms of depression, but does occasionally have some trouble falling/staying asleep as a longtime issue for many years. Nyquil PM at times to help him sleep. Reads in bed, does not watch TV in bed.   Reviewed all medications.  Patient is followed by Dr [name] from cardiology for bare metal stent in proximal LAD in 2005.  He remains on ASA, atorvastatin, metoprolol. His A1C today is almost exactly the same as previous (7.5%). Reports his CBGs at home run around 140 (random readings).  Reviewed preventive services, offered flu vaccine, which he refuses.  Had pneumovax after age 20 (he believes it was around 2006 or 2007). Review of Systems     Objective:   Physical Exam Well appearing, no apparent distress HEENT neck supple, no cervical adenopathy.  COR Regular S1S2, no extra sounds PULM Clear bilaterally, no rales or wheezes EXTS: No appreciable ankle edema bilaterally.        Assessment & Plan:

## 2014-06-19 NOTE — Patient Instructions (Signed)
It was a pleasure to see you today. Your diabetes control is adequate, and it is unchanged from last year.   No changes in your medication regimen.  I would like to see you back in another six months (February 2016).

## 2014-06-19 NOTE — Assessment & Plan Note (Signed)
A1C is stable at 7.5%; remains on metformin 500mg  one time daily. To follow again in 4-6 months and consider increase of metformin dose.

## 2014-09-17 ENCOUNTER — Ambulatory Visit (INDEPENDENT_AMBULATORY_CARE_PROVIDER_SITE_OTHER): Payer: Medicare Other | Admitting: Cardiology

## 2014-09-17 ENCOUNTER — Encounter: Payer: Self-pay | Admitting: Cardiology

## 2014-09-17 VITALS — BP 130/56 | HR 62 | Ht 72.0 in | Wt 243.3 lb

## 2014-09-17 DIAGNOSIS — I10 Essential (primary) hypertension: Secondary | ICD-10-CM | POA: Diagnosis not present

## 2014-09-17 DIAGNOSIS — I2510 Atherosclerotic heart disease of native coronary artery without angina pectoris: Secondary | ICD-10-CM

## 2014-09-17 DIAGNOSIS — E785 Hyperlipidemia, unspecified: Secondary | ICD-10-CM

## 2014-09-17 NOTE — Patient Instructions (Signed)
Continue your current therapy  I will see you in one year   

## 2014-09-19 NOTE — Progress Notes (Signed)
Colin Newman Date of Birth: [birth year]   History of Present Illness: [name] is seen for yearly followup of CAD. He has a history of coronary disease and had stenting of the proximal LAD in 2005 with a bare-metal stent. On followup today he states he is doing well. His walking is limited by arthritis but he does do some yard work and rides a stationary bike.  His last Myoview study in October 2013 was normal. He states he had 2 sharp chest pains after eating about 6 weeks ago but none since. He has no edema. He denies any palpitations.  Current Outpatient Prescriptions on File Prior to Visit  Medication Sig Dispense Refill  . amLODipine (NORVASC) 5 MG tablet TAKE ONE TABLET BY MOUTH ONCE DAILY 90 tablet 6  . aspirin 81 MG tablet Take 81 mg by mouth daily.      [name] atorvastatin (LIPITOR) 40 MG tablet TAKE ONE TABLET BY MOUTH ONCE DAILY 90 tablet 6  . Blood Glucose Monitoring Suppl (ONE TOUCH ULTRA SYSTEM KIT) W/DEVICE KIT 1 kit by Does not apply route once. 100 each 6  . glucose blood (ONE TOUCH ULTRA TEST) test strip USE TO TEST BLOOD GLUCOSE ONCE DAILY 100 each 5  . glucose blood (ONE TOUCH ULTRA TEST) test strip USE TO TEST BLOOD GLUCOSE ONCE DAILY 100 each 5  . hydrochlorothiazide (HYDRODIURIL) 25 MG tablet Take 1 tablet (25 mg total) by mouth daily. 90 tablet 3  . metFORMIN (GLUCOPHAGE) 500 MG tablet TAKE ONE TABLET BY MOUTH ONCE DAILY WITH  BREAKFAST 90 tablet 6  . metoprolol (LOPRESSOR) 100 MG tablet TAKE ONE TABLET BY MOUTH TWICE DAILY 180 tablet 6  . Multiple Vitamin (MULTI-VITAMIN DAILY PO) Take 1 tablet by mouth 1 day or 1 dose.    . nitroGLYCERIN (NITROSTAT) 0.4 MG SL tablet Place 1 tablet (0.4 mg total) under the tongue every 5 (five) minutes as needed for chest pain. 25 tablet 11  . Omega-3 Fatty Acids (FISH OIL) 1200 MG CAPS Take 1 capsule by mouth daily.      Glory Rosebush DELICA LANCETS 00L MISC USE ONE LANCET DAILY 100 each 0  . saw palmetto 500 MG capsule Take 500 mg by  mouth daily.     No current facility-administered medications on file prior to visit.    Allergies  Allergen Reactions  . Lisinopril Hives, Itching and Cough  . Hctz [Hydrochlorothiazide] Itching    Past Medical History  Diagnosis Date  . Hypertension   . Single vessel coronary disease     s/p stent LAD 2005  . Obesity   . Glucose intolerance (pre-diabetes)     Probably   . Dyslipidemia     Hx with intolerance to statin therapy  . Constipation     Past Surgical History  Procedure Laterality Date  . Other surgical history      Stent in the proximal left anterior descending in 2005  . Bilateral knee arthroscopy    . Back surgery      2 previous  . Cardiovascular stress test  07-20-2009    EF 73%    History  Smoking status  . Former Smoker  Smokeless tobacco  . Not on file    History  Alcohol Use No    Comment: Rarely has a beer    Family History  Problem Relation Age of Onset  . Pulmonary embolism Mother   . Cancer Father     Review of Systems: As noted  in history of present illness. All other systems were reviewed and are negative.  Physical Exam: BP 130/56 mmHg  Pulse 62  Ht 6' (1.829 m)  Wt 243 lb 4.8 oz (110.36 kg)  BMI 32.99 kg/m2 He is an obese white male in no acute distress. The HEENT exam is normal. The carotids are 2+ without bruits.  There is no thyromegaly.  There is no JVD.  The lungs are clear.    The heart exam reveals a regular rate with a normal S1 and S2.  There are no murmurs, gallops, or rubs.  The PMI is not displaced.   Abdominal exam reveals good bowel sounds.   There is no hepatosplenomegaly or tenderness.  There are no masses.  Exam of the legs reveal no clubbing, cyanosis, or edema.    The distal pulses are intact.  Cranial nerves II - XII are intact.  Motor and sensory functions are intact.   LABORATORY DATA: Lab Results  Component Value Date   CHOL 118 08/21/2012   HDL 32* 08/21/2012   LDLCALC 46 08/21/2012   LDLDIRECT 66  11/04/2013   TRIG 200* 08/21/2012   CHOLHDL 3.7 08/21/2012   ECG today demonstrates normal sinus rhythm with first degree AV block. Rate is 62 beats per minute. It is otherwise normal. I have personally reviewed and interpreted this study.   Assessment / Plan: 1. Coronary disease status post stenting of the proximal LAD in 2005 with a bare-metal stent. Normal Myoview study in October 2013. He is asymptomatic. We will continue with his medical management. I'll followup again in one year.  2. Hypertension. Well controlled. Continue current medication  3. Diabetes mellitus type 2.  4. Hyperlipidemia. Continue statin therapy.

## 2014-10-20 DIAGNOSIS — Z961 Presence of intraocular lens: Secondary | ICD-10-CM | POA: Diagnosis not present

## 2014-10-20 DIAGNOSIS — H26492 Other secondary cataract, left eye: Secondary | ICD-10-CM | POA: Diagnosis not present

## 2015-03-16 ENCOUNTER — Ambulatory Visit (INDEPENDENT_AMBULATORY_CARE_PROVIDER_SITE_OTHER): Payer: Medicare Other | Admitting: Family Medicine

## 2015-03-16 ENCOUNTER — Encounter: Payer: Self-pay | Admitting: Family Medicine

## 2015-03-16 VITALS — BP 152/72 | HR 69 | Temp 98.1°F | Ht 70.0 in | Wt 239.4 lb

## 2015-03-16 DIAGNOSIS — IMO0002 Reserved for concepts with insufficient information to code with codable children: Secondary | ICD-10-CM

## 2015-03-16 DIAGNOSIS — L84 Corns and callosities: Secondary | ICD-10-CM | POA: Insufficient documentation

## 2015-03-16 DIAGNOSIS — M21371 Foot drop, right foot: Secondary | ICD-10-CM | POA: Diagnosis not present

## 2015-03-16 DIAGNOSIS — I10 Essential (primary) hypertension: Secondary | ICD-10-CM | POA: Diagnosis not present

## 2015-03-16 DIAGNOSIS — E785 Hyperlipidemia, unspecified: Secondary | ICD-10-CM

## 2015-03-16 DIAGNOSIS — E1165 Type 2 diabetes mellitus with hyperglycemia: Secondary | ICD-10-CM

## 2015-03-16 LAB — POCT GLYCOSYLATED HEMOGLOBIN (HGB A1C): HEMOGLOBIN A1C: 9.5

## 2015-03-16 MED ORDER — AMLODIPINE BESYLATE 5 MG PO TABS: ORAL_TABLET | ORAL | Status: DC

## 2015-03-16 MED ORDER — METOPROLOL TARTRATE 100 MG PO TABS: 100.0000 mg | ORAL_TABLET | Freq: Two times a day (BID) | ORAL | Status: DC

## 2015-03-16 MED ORDER — GLUCOSE BLOOD VI STRP: ORAL_STRIP | Status: DC

## 2015-03-16 MED ORDER — METFORMIN HCL 500 MG PO TABS: 500.0000 mg | ORAL_TABLET | Freq: Two times a day (BID) | ORAL | Status: DC

## 2015-03-16 MED ORDER — HYDROCHLOROTHIAZIDE 25 MG PO TABS: 25.0000 mg | ORAL_TABLET | Freq: Every day | ORAL | Status: DC

## 2015-03-16 MED ORDER — ATORVASTATIN CALCIUM 40 MG PO TABS: ORAL_TABLET | ORAL | Status: DC

## 2015-03-16 MED ORDER — ONETOUCH DELICA LANCETS 33G MISC: Status: DC

## 2015-03-16 NOTE — Patient Instructions (Signed)
Thank you for coming in, today!  I think your blood sugar medicines may need to be increased. I want to check your blood work today to see what, if anything, we need to change.  I will refill your regular medicines, today. I will give you printed prescriptions for test strips and lancets. If your A1c is high, I'll go up on your metformin to twice per day.  I do want you to see a foot doctor for the callus on your foot. They may also be able to help with a better brace for your foot. If you haven't heard from [place] or the foot doctor's office in about 2 weeks, call back to ask about the referral.  Come back to see Dr. Lamar Benes (your new regular doctor) in about 1 month. He'll see what other changes need to be made. If you need to be seen sooner, call or come back any time.  Please feel free to call with any questions or concerns at any time, at [phone]. --Dr. Venetia Maxon

## 2015-03-16 NOTE — Progress Notes (Signed)
   Subjective:    Patient ID: Colin Newman, male    DOB: [birth year], 81 y.o.   MRN: [id number]  HPI: Pt presents to clinic for SDA for concern for high blood sugars. He reports about 2-3 weeks of high readings on his glucometer at home; he ws 292 earlier today (fasting) and had one over 300 last week. He states he checks it 2-3 times per day and has been in the high 200's for the past couple of weeks. He has not missed any doses of medications (see below) or changed anything on his own. He reports no changes in his diet or activity level. His last A1c was 7.5 about 9 months ago. Other than increased urination and thirst, he doesn't have any other symptoms. He does not have significant nocturia. He has no paresthesias or neuropathic type pain.  Of note, pt needs refills on lancets, strips, and his medications for HTN, HLD, and DM. He has not had labs in quite some time, but he "generally feels fine for 81." He has not met Dr. Lamar Benes, who was assigned as his PCP after Dr. Lindell Noe has left.  Review of Systems: As above.     Objective:   Physical Exam BP 152/72 mmHg  Pulse 69  Temp(Src) 98.1 F (36.7 C) (Oral)  Ht $R'5\' 10"'Bu$  (1.778 m)  Wt 239 lb 6 oz (108.58 kg)  BMI 34.35 kg/m2 Gen: well-appearing elderly adult male; BP manually rechecked as above HEENT: [place]/AT, EOMI, PERRLA, MMM Cardio: RRR, no murmur appreciated Pulm: CTAB, no wheezes, normal WOB Abd: soft, nontender, BS+ Ext: warm, well-perfused, no LE edema Diabetic foot exam: See EPIC flowsheets  Bilateral sensation mildly diminished to light touch, but skin generally intact  Pulses 1+ and symmetric  Notable for right foot drop (baseline, per pt, after back surgery "quite some time ago")  Right instep with ~2cm diameter pre-ulcerative callus with central eschar, without bleeding, induration, or discharge  Hb A1c: 9.5     Assessment & Plan:  81yo male with hx of reasonably-controlled DM, now with elevated CBG's and higher A1c; also  with HTN (mildly elevated) and HLD - no recent labs done; last lipid panel nearly 3 years ago - noted foot drop on the right with pre-ulcerative callus to the instep, caused per pt report from foot brace given by neurosurgeon after back surgery in the past - needs refills on all meds and diabetes test supplies  Plan: - increase metformin from 500 mg once daily to BID (notably not using XR form, so likely should be on BID dosing, regardless) - new Rx given for lancets and strips for BID CBG checks - referred to podiatry given pre-ulcerative callus and foot drop in context of DM - refilled amlodipine, HCTZ, metoprolol, and Lipitor as well (note HCTZ on allergy list but pt reports he takes it with no issues) - labs drawn today (CMP, lipid panel, A1c) essentially for baseline for new PCP Dr. Lamar Benes - will f/u on labs as appropriate but will defer HTN medication and other med changes to Dr. Lamar Benes - advised pt to f/u with Dr. Lamar Benes in about 1 month, regardless, for further med adjustment as needed  Note FYI to Dr. Marland Mcalpine, MD PGY-3, [facility] 03/16/2015, 8:48 PM

## 2015-03-17 LAB — COMPREHENSIVE METABOLIC PANEL
ALT: 17 U/L (ref 0–53)
AST: 15 U/L (ref 0–37)
Albumin: 4 g/dL (ref 3.5–5.2)
Alkaline Phosphatase: 86 U/L (ref 39–117)
BUN: 17 mg/dL (ref 6–23)
CHLORIDE: 101 meq/L (ref 96–112)
CO2: 28 mEq/L (ref 19–32)
CREATININE: 0.94 mg/dL (ref 0.50–1.35)
Calcium: 8.8 mg/dL (ref 8.4–10.5)
Glucose, Bld: 160 mg/dL — ABNORMAL HIGH (ref 70–99)
POTASSIUM: 4.2 meq/L (ref 3.5–5.3)
Sodium: 139 mEq/L (ref 135–145)
Total Bilirubin: 0.9 mg/dL (ref 0.2–1.2)
Total Protein: 6.6 g/dL (ref 6.0–8.3)

## 2015-03-17 LAB — LIPID PANEL
CHOL/HDL RATIO: 3.3 ratio
Cholesterol: 113 mg/dL (ref 0–200)
HDL: 34 mg/dL — ABNORMAL LOW (ref >=40)
LDL Cholesterol: 51 mg/dL (ref 0–99)
Triglycerides: 140 mg/dL (ref <150)
VLDL: 28 mg/dL (ref 0–40)

## 2015-03-18 ENCOUNTER — Encounter: Payer: Self-pay | Admitting: Family Medicine

## 2015-03-24 ENCOUNTER — Ambulatory Visit (INDEPENDENT_AMBULATORY_CARE_PROVIDER_SITE_OTHER): Payer: Medicare Other

## 2015-03-24 ENCOUNTER — Encounter: Payer: Self-pay | Admitting: Podiatry

## 2015-03-24 ENCOUNTER — Ambulatory Visit (INDEPENDENT_AMBULATORY_CARE_PROVIDER_SITE_OTHER): Payer: Medicare Other | Admitting: Podiatry

## 2015-03-24 VITALS — BP 158/71 | HR 69 | Resp 18

## 2015-03-24 DIAGNOSIS — M21371 Foot drop, right foot: Secondary | ICD-10-CM | POA: Diagnosis not present

## 2015-03-24 DIAGNOSIS — R52 Pain, unspecified: Secondary | ICD-10-CM

## 2015-03-24 DIAGNOSIS — L84 Corns and callosities: Secondary | ICD-10-CM

## 2015-03-24 DIAGNOSIS — B351 Tinea unguium: Secondary | ICD-10-CM

## 2015-03-24 NOTE — Progress Notes (Signed)
   Subjective:    Patient ID: Colin Newman, male    DOB: 12/25/1932, 81 y.o.   MRN: [id number]  HPI  81 year old male presents the office today with complaints of a sore to his right foot. He states that he developed a callus over the area after wearing his AFO for which she has her dropfoot. He states that his AFO was approximately 81 years old and does not fit well. He was in a lot of walking status sites with a brace although it caused irritation resulting in the callus/wound. He denies any redness or drainage from around the site. Denies any pain. He also states his nails are painful, elongated emulsion them himself. Denies any redness drains on the nail sites. His last HbA1c was 9.5 No other complaints at this time.   Review of Systems  Constitutional: Positive for appetite change.  Musculoskeletal:       DIFFICULTY WALKING  All other systems reviewed and are negative.      Objective:   Physical Exam AAO x3, NAD DP/PT pulses palpable bilaterally, CRT less than 3 seconds Protective sensation decreased with Simms Weinstein monofilament, vibratory sensation decreased, Achilles tendon reflex decreased on there right.  No areas of tenderness to bilateral lower extremities. Dorsiflexion decrease to the right ankle. Otherwise, MMT intact. ROM WNL.  Along the medial aspect of the right midfoot there is a hyperkerotic leison with a central scab. Upon debridement, there is no open lesion. There is no surrounding erythema, drainage, or other signs of infection. There is also a pre-ulcerative lesion to the medial aspect of the left 4th digit. Upon gait evaluation he drags the right foot.  Nails are hypertrophic, dystrophic, discolored, brittle, elongated x 10. There is no surrounding erythema or drainage. There is tenderness to palpation over the nails 1-5 bilaterally.  No other open lesions or pre-ulcerative lesions.  No overlying edema, erythema, increase in warmth to bilateral lower extremities.   No pain with calf compression, swelling, warmth, erythema bilaterally.      Assessment & Plan:  81 year old male presents for pre-ulcerative calluses to the right medial foot and left fourth digit; dropfoot -X-rays were obtained and reviewed with the patient.  -Treatment options discussed including all alternatives, risks, and complications -Nail sharply debrided 10 without complication/bleeding. -Pre-ulcerative callus on the right medial foot which we debrided without complications. No underlying ulceration at this time all other central scab which remained intact as is firmly adhered. Continued antibiotic ointment and a Band-Aid daily. Offloading pad to the left fourth toe. Monitor for any further skin breakdown or any signs or symptoms of infection. If any are to occur, to call the office immediately. -Lesion on the right foot is due to poor fitting AFO brace to dropfoot. At this appointment had him be evaluated by our orthotist, Benjie Karvonen, for a new brace.  -Follow-up with me in 3 weeks to recheck the pre-ulcerative sites. In the meantime, encouraged to call the office with any questions/concerns/change in symptoms.

## 2015-04-12 ENCOUNTER — Encounter: Payer: Self-pay | Admitting: Podiatry

## 2015-04-12 ENCOUNTER — Ambulatory Visit (INDEPENDENT_AMBULATORY_CARE_PROVIDER_SITE_OTHER): Payer: Medicare Other | Admitting: Podiatry

## 2015-04-12 DIAGNOSIS — L84 Corns and callosities: Secondary | ICD-10-CM | POA: Diagnosis not present

## 2015-04-13 NOTE — Progress Notes (Signed)
Patient ID: Colin Newman, male   DOB: 1932-12-01, 81 y.o.   MRN: [id number]  Subjective: 81 year old male presents the office they follow up evaluation of ulceration/pre-ulcerative callus to the right medial foot. He states that since last appointment the areas doing well be is no pain to the area. He states the overlying wound is healed. He says that is slight redness around the area from the healing however he denies any red streaks or any drainage or purulence. Also states he wore new shoes since last appointment causing some irritation to the big toe on the right foot for which she developed a callus. Denies any drainage or redness from the area. No other complaints at this time in no acute changes since last appointment.  Objective: AAO 3, NAD DP/PT pulses palpable, CRT less than 3 seconds  Protective sensation decreased with Simms Weinstein monofilament  On the medial aspect to the left foot over the site of the pre-ulcerative callus there is no hyperkeratotic tissue at this time. There is new, pink skin formed in this area however there is no specific erythema. There is no tenderness palpation. There is no ascending cellulitis, flexors, crepitus, malodor, drainage. On the right medial hallux IPJ there is a small hyperkeratotic lesion. Upon debridement no underlying ulceration, drainage or other clinical signs of infection. There is hallux malleus present. Lesion left fourth toe is healed. No other open lesions or pre-ulcer lesions identified bilaterally. There is no pain with calf compression, swelling, warmth, erythema.  Assessment: 81 year old male with pre-ulcerative sites right foot, healing  Plan: -Treatment options discussed including all alternatives, risks, and complications -Hyperkeratotic lesion right hallux sharply debrided without competitions/bleeding. Discussed shoe gear modifications in the monitor which shoes he is wearing. -Monitor for any further skin breakdown. If there  are any changes to call the office immediately.  -He will obtain the new brace next couple weeks. Follow-up at that time. In the meantime encouraged to call the office with any question, concerns, change in symptoms.  Celesta Gentile, DPM

## 2015-04-14 ENCOUNTER — Encounter: Payer: Self-pay | Admitting: Family Medicine

## 2015-04-14 ENCOUNTER — Ambulatory Visit (INDEPENDENT_AMBULATORY_CARE_PROVIDER_SITE_OTHER): Payer: Medicare Other | Admitting: Family Medicine

## 2015-04-14 VITALS — BP 168/65 | HR 71 | Temp 98.6°F | Ht 70.0 in | Wt 233.0 lb

## 2015-04-14 DIAGNOSIS — I10 Essential (primary) hypertension: Secondary | ICD-10-CM

## 2015-04-14 DIAGNOSIS — E1165 Type 2 diabetes mellitus with hyperglycemia: Secondary | ICD-10-CM | POA: Diagnosis not present

## 2015-04-14 DIAGNOSIS — IMO0002 Reserved for concepts with insufficient information to code with codable children: Secondary | ICD-10-CM

## 2015-04-14 NOTE — Patient Instructions (Addendum)
Consider getting your Pneumonia shot at next visit.  Come back to get your blood pressure checked in 2-4 weeks. We may need to start a new medicine to help with your blood pressure.  Next A1c check is in September

## 2015-04-14 NOTE — Assessment & Plan Note (Signed)
Uncontrolled/worsened with last a1c a month ago. Metformin increased to BID which he has tolerated. Taking his CBG twice a day right now, however if his sugars are in better ranges after this 28 day trial (apparently filling out a log for medicare?) likely can go back to once a day. Pt declines PNA vaccine today but will consider it. F/u 3 months.

## 2015-04-14 NOTE — Progress Notes (Signed)
   Subjective:    Patient ID: TUFF CLABO, male    DOB: Oct 18, 1932, 81 y.o.   MRN: [id number]  HPI  CC: follow up  # Hypertension:  Taking amlodipine 5mg , hctz 25mg , no missed doses  Feels he can improve his BP by losing some more weight  No symptoms of hypotension ROS: no HA, CP, SOB  # Diabetes  Last A1c was 9, worst it has been recently (normal appears 6-7)  Right now taking his BS 2 times a day after new strip rx was sent in, says he is filling out a log that will be sent to medicare.   Knew his last a1c was going to be elevated because of his diet and gaining some weight ROS: no dysuria/polyuria, no fatigue  Review of Systems   See HPI for ROS. All other systems reviewed and are negative.  Past medical history, surgical, family, and social history reviewed and updated in the EMR as appropriate. Objective:  BP 168/65 mmHg  Pulse 71  Temp(Src) 98.6 F (37 C) (Oral)  Ht 5\' 10"  (1.778 m)  Wt 233 lb (105.688 kg)  BMI 33.43 kg/m2 Vitals and nursing note reviewed  General: NAD Ext: no edema or cyanosis Psych: normal mood and affect.   Assessment & Plan:  See Problem List Documentation

## 2015-04-14 NOTE — Assessment & Plan Note (Signed)
Not at goal. On amlodipine 5mg , HCTZ 25mg . BP has been elevated for last several visits. Discussed starting third medicine but pt declines and wants to try and lose more weight. Had labs drawn which were normal at beginning of month. F/u 2-4 weeks for nursing BP visit.

## 2015-04-28 ENCOUNTER — Ambulatory Visit (INDEPENDENT_AMBULATORY_CARE_PROVIDER_SITE_OTHER): Payer: Medicare Other | Admitting: *Deleted

## 2015-04-28 DIAGNOSIS — R262 Difficulty in walking, not elsewhere classified: Secondary | ICD-10-CM | POA: Diagnosis not present

## 2015-04-28 DIAGNOSIS — R52 Pain, unspecified: Secondary | ICD-10-CM

## 2015-04-28 DIAGNOSIS — M21371 Foot drop, right foot: Secondary | ICD-10-CM

## 2015-04-28 NOTE — Progress Notes (Signed)
Patient ID: ESCO JOSLYN, male   DOB: [birth year], 81 y.o.   MRN: [id number] Patient presents for fitting of [place] with Betha Cped.  Verbal and written break in and wear instructions given.  Patient will follow up in 6 weeks with Dr Jacqualyn Posey.

## 2015-05-05 ENCOUNTER — Ambulatory Visit (INDEPENDENT_AMBULATORY_CARE_PROVIDER_SITE_OTHER): Payer: Medicare Other | Admitting: *Deleted

## 2015-05-05 VITALS — BP 144/60 | HR 61

## 2015-05-05 DIAGNOSIS — Z136 Encounter for screening for cardiovascular disorders: Secondary | ICD-10-CM

## 2015-05-05 DIAGNOSIS — Z0130 Encounter for examination of blood pressure without abnormal findings: Secondary | ICD-10-CM

## 2015-05-05 NOTE — Progress Notes (Signed)
   Pt in nurse clinic for blood pressure check.  BP 144/60 manually, heart rate 61.  Pt denies any symptoms today.  Will forward to PCP.  Derl Barrow, RN

## 2015-06-11 ENCOUNTER — Ambulatory Visit (INDEPENDENT_AMBULATORY_CARE_PROVIDER_SITE_OTHER): Payer: Medicare Other | Admitting: Podiatry

## 2015-06-11 ENCOUNTER — Encounter: Payer: Self-pay | Admitting: Podiatry

## 2015-06-11 VITALS — BP 160/73 | HR 61 | Resp 18

## 2015-06-11 DIAGNOSIS — L84 Corns and callosities: Secondary | ICD-10-CM

## 2015-06-11 DIAGNOSIS — M21371 Foot drop, right foot: Secondary | ICD-10-CM | POA: Diagnosis not present

## 2015-06-18 ENCOUNTER — Encounter: Payer: Self-pay | Admitting: Podiatry

## 2015-06-18 NOTE — Progress Notes (Signed)
Patient ID: Colin Newman, male   DOB: 1933/01/26, 81 y.o.   MRN: [id number]  Subjective: 81 year old male presents the office today for follow-up evaluation after dispensing AFO brace. He states that overall he is doing better and he can walk better and the brace is fitting well and he is happy with it. He denies any sores or irritation from the brace. No other complaints at this time in no acute changes.  Objective: AAO 3, NAD DP/PT pulses palpable, CRT less than 3 seconds Protective sensation intact with Simms Weinstein monofilament On the medial aspect left foot over the site of the prior irritation/ulceration there is none present at this time. There is bony prominences however there is no overlying skin breakdown. There is dropfoot present on the right. There is no open lesions or pre-ulcerative lesions of the right side. There is no areas tenderness bilateral lower joints. Edema, erythema, increase in warmth. There is no pain with calf compression, swelling, warmth, erythema.  Assessment: 81 year old male right drop foot, brace is fitting well  Plan: -Treatment options discussed including all alternatives, risks, and complications -At this time continue with the brace as it appears to be fitting well. As he palms to call the office immediately. Discussed the importance of daily foot inspection. Follow-up as needed. Call the office in the questions, concerns, changes symptoms.  Celesta Gentile, DPM

## 2015-07-05 ENCOUNTER — Telehealth: Payer: Self-pay | Admitting: *Deleted

## 2015-07-05 NOTE — Telephone Encounter (Signed)
Received a call from [facility] needing a new Rx for One Touch Ultra test strips and One Touch Delica Lancets.  Completed standardized DM form, signed by Dr. Mingo Amber.  Faxed to [facility].  Derl Barrow, RN

## 2015-07-06 NOTE — Telephone Encounter (Signed)
Spoke with patient regarding test strips and lancets.  Informed him that [facility] sent the Rx back with the 2 a day due the refills were 11 and Medicare would only cover 5 refills.  Rx was sent with only 5 refills and 2 a day testing.  Patient voiced understanding. Derl Barrow, RN

## 2015-07-06 NOTE — Telephone Encounter (Signed)
Pt is calling about this request, he states that he has been having issues with getting this filled correctly. He states that he need the rx for the strips for be prescribed once daily, as the "twice daily has caused issues with Medicare" please note that the pt has been unable to check his blood sugar since last Saturday 07/03/15. He needs these strips as soon as possible. Please f/u with with the pt. Thank you, Fonda Kinder, ASA

## 2015-07-21 ENCOUNTER — Ambulatory Visit (INDEPENDENT_AMBULATORY_CARE_PROVIDER_SITE_OTHER): Payer: Medicare Other | Admitting: Family Medicine

## 2015-07-21 ENCOUNTER — Encounter: Payer: Self-pay | Admitting: Family Medicine

## 2015-07-21 VITALS — BP 144/68 | HR 57 | Temp 98.0°F | Ht 70.0 in | Wt 229.0 lb

## 2015-07-21 DIAGNOSIS — Z23 Encounter for immunization: Secondary | ICD-10-CM

## 2015-07-21 DIAGNOSIS — IMO0002 Reserved for concepts with insufficient information to code with codable children: Secondary | ICD-10-CM

## 2015-07-21 DIAGNOSIS — E1165 Type 2 diabetes mellitus with hyperglycemia: Secondary | ICD-10-CM

## 2015-07-21 DIAGNOSIS — E1169 Type 2 diabetes mellitus with other specified complication: Secondary | ICD-10-CM

## 2015-07-21 DIAGNOSIS — I10 Essential (primary) hypertension: Secondary | ICD-10-CM

## 2015-07-21 LAB — POCT GLYCOSYLATED HEMOGLOBIN (HGB A1C): HEMOGLOBIN A1C: 7.7

## 2015-07-21 MED ORDER — TETANUS-DIPHTH-ACELL PERTUSSIS 5-2.5-18.5 LF-MCG/0.5 IM SUSP: 0.5000 mL | Freq: Once | INTRAMUSCULAR | Status: DC

## 2015-07-21 NOTE — Patient Instructions (Addendum)
Next visit in 3 months  Bring your blood sugar meter and blood pressure cuff with you to that visit  Ideally we want your blood pressure to be below 150/90. If your blood pressure is above this number regularly we may need to make medication adjustments.  Tetanus/pertussis shot was sent to the pharmacy.

## 2015-07-21 NOTE — Assessment & Plan Note (Signed)
Improved again, a1c 7.7. On metformin 500mg  twice daily (reports never having tried increasing to 1000mg ). Continue current regimen, he is on twice daily CBG monitoring since he was uncontrolled. Follow up 3 months.

## 2015-07-21 NOTE — Progress Notes (Signed)
   Subjective:    Patient ID: Colin Newman, male    DOB: May 17, 1933, 81 y.o.   MRN: [id number]  HPI  CC: follow up  # Diabetes  Current medications: metformin 500 twice daily   Insulin: none  Adherent to medications: yes, missed maybe 1-2  Symptomatic high/low blood sugars:   CBG monitoring: average around 200, lowest in past few weeks 124  Foot exam: due May 2017.   Eye exam: not established  Microalbuminemia: due Jan 2017   # Hypertension:  On amlodipine and hydrochlorothiazide, metoprolol  Has home BP cuff, says SBP in 150-160s ROS: no HA, no dizziness, no CP, no SOB  Social Hx: former smoker  Review of Systems   See HPI for ROS.   Past medical history, surgical, family, and social history reviewed and updated in the EMR as appropriate. Objective:  BP 172/65 mmHg  Pulse 57  Temp(Src) 98 F (36.7 C) (Oral)  Ht 5\' 10"  (1.778 m)  Wt 229 lb (103.874 kg)  BMI 32.86 kg/m2 Vitals and nursing note reviewed Repeat blood pressure by manual cuff 144/68  General: NAD  CV: RRR, normal s1s2 no murmurs/rub/gallop 2+ radial pulses bilaterally  Resp: clear to auscultation bilaterally normal effort Ext: no edema Neuro: alert and oriented, no focal deficits, normal gait  Assessment & Plan:  Diabetes mellitus type 2, uncontrolled Improved again, a1c 7.7. On metformin 500mg  twice daily (reports never having tried increasing to 1000mg ). Continue current regimen, he is on twice daily CBG monitoring since he was uncontrolled. Follow up 3 months.  HYPERTENSION, BENIGN SYSTEMIC Initial BP uncontrolled, repeat manual measurement at goal. He reports his home BP cuff measurements in [id number] for systolic BP. Recommend he bring this in to clinic to evaluate. Continue current regimen. Follow up 3 months.

## 2015-07-21 NOTE — Assessment & Plan Note (Signed)
Initial BP uncontrolled, repeat manual measurement at goal. He reports his home BP cuff measurements in [id number] for systolic BP. Recommend he bring this in to clinic to evaluate. Continue current regimen. Follow up 3 months.

## 2015-08-03 DIAGNOSIS — Z1211 Encounter for screening for malignant neoplasm of colon: Secondary | ICD-10-CM | POA: Diagnosis not present

## 2015-08-03 DIAGNOSIS — E669 Obesity, unspecified: Secondary | ICD-10-CM | POA: Diagnosis not present

## 2015-08-03 DIAGNOSIS — Z86010 Personal history of colonic polyps: Secondary | ICD-10-CM | POA: Diagnosis not present

## 2015-08-03 DIAGNOSIS — K5900 Constipation, unspecified: Secondary | ICD-10-CM | POA: Diagnosis not present

## 2015-08-10 DIAGNOSIS — Z1211 Encounter for screening for malignant neoplasm of colon: Secondary | ICD-10-CM | POA: Diagnosis not present

## 2015-08-10 DIAGNOSIS — Z1212 Encounter for screening for malignant neoplasm of rectum: Secondary | ICD-10-CM | POA: Diagnosis not present

## 2015-11-01 ENCOUNTER — Encounter: Payer: Self-pay | Admitting: Cardiology

## 2015-11-01 ENCOUNTER — Ambulatory Visit (INDEPENDENT_AMBULATORY_CARE_PROVIDER_SITE_OTHER): Payer: Medicare Other | Admitting: Cardiology

## 2015-11-01 VITALS — BP 150/72 | HR 77 | Ht 71.0 in | Wt 233.1 lb

## 2015-11-01 DIAGNOSIS — I10 Essential (primary) hypertension: Secondary | ICD-10-CM | POA: Diagnosis not present

## 2015-11-01 DIAGNOSIS — I259 Chronic ischemic heart disease, unspecified: Secondary | ICD-10-CM

## 2015-11-01 DIAGNOSIS — E785 Hyperlipidemia, unspecified: Secondary | ICD-10-CM

## 2015-11-01 DIAGNOSIS — I2510 Atherosclerotic heart disease of native coronary artery without angina pectoris: Secondary | ICD-10-CM

## 2015-11-01 MED ORDER — METOPROLOL TARTRATE 100 MG PO TABS: 50.0000 mg | ORAL_TABLET | Freq: Two times a day (BID) | ORAL | Status: DC

## 2015-11-01 NOTE — Progress Notes (Signed)
Colin Newman Date of Birth: 06-08-1933   History of Present Illness: Colin Newman is seen for yearly followup of CAD. He has a history of coronary disease and had stenting of the proximal LAD in 2005 with a bare-metal stent.   His last Myoview study in October 2013 was normal.  He reports he has done very well this year. No chest pain, SOB, palpitations, dizziness, fatigue, or edema. Walks or uses a stationary bike daily. He has gained about 5 lbs.   Current Outpatient Prescriptions on File Prior to Visit  Medication Sig Dispense Refill  . amLODipine (NORVASC) 5 MG tablet TAKE ONE TABLET BY MOUTH ONCE DAILY 90 tablet 1  . aspirin 81 MG tablet Take 81 mg by mouth daily.      [name] atorvastatin (LIPITOR) 40 MG tablet TAKE ONE TABLET BY MOUTH ONCE DAILY 90 tablet 1  . Blood Glucose Monitoring Suppl (ONE TOUCH ULTRA SYSTEM KIT) W/DEVICE KIT 1 kit by Does not apply route once. 100 each 6  . glucose blood (ONE TOUCH ULTRA TEST) test strip Use for blood sugar checks twice daily. 100 each 11  . hydrochlorothiazide (HYDRODIURIL) 25 MG tablet Take 1 tablet (25 mg total) by mouth daily. 90 tablet 1  . metFORMIN (GLUCOPHAGE) 500 MG tablet Take 1 tablet (500 mg total) by mouth 2 (two) times daily with a meal. 180 tablet 1  . Multiple Vitamin (MULTI-VITAMIN DAILY PO) Take 1 tablet by mouth 1 day or 1 dose.    . nitroGLYCERIN (NITROSTAT) 0.4 MG SL tablet Place 1 tablet (0.4 mg total) under the tongue every 5 (five) minutes as needed for chest pain. 25 tablet 11  . Omega-3 Fatty Acids (FISH OIL) 1200 MG CAPS Take 1 capsule by mouth daily.      Glory Rosebush DELICA LANCETS 40J MISC Use to check blood sugar twice daily. 100 each 11  . saw palmetto 500 MG capsule Take 500 mg by mouth daily.     No current facility-administered medications on file prior to visit.    Allergies  Allergen Reactions  . Lisinopril Hives, Itching and Cough  . Hctz [Hydrochlorothiazide] Itching    Past Medical History  Diagnosis  Date  . Hypertension   . Single vessel coronary disease     s/p stent LAD 2005  . Obesity   . Glucose intolerance (pre-diabetes)     Probably   . Dyslipidemia     Hx with intolerance to statin therapy  . Constipation     Past Surgical History  Procedure Laterality Date  . Other surgical history      Stent in the proximal left anterior descending in 2005  . Bilateral knee arthroscopy    . Back surgery      2 previous  . Cardiovascular stress test  07-20-2009    EF 73%    History  Smoking status  . Former Smoker  Smokeless tobacco  . Never Used    History  Alcohol Use No    Comment: Rarely has a beer    Family History  Problem Relation Age of Onset  . Pulmonary embolism Mother   . Cancer Father     Review of Systems: As noted in history of present illness. All other systems were reviewed and are negative.  Physical Exam: BP 150/72 mmHg  Pulse 77  Ht _0  (1.803 m)  Wt 105.716 kg (233 lb 1 oz)  BMI 32.52 kg/m2 He is an obese white male in no  acute distress. The HEENT exam is normal. The carotids are 2+ without bruits.  There is no thyromegaly.  There is no JVD.  The lungs are clear.    The heart exam reveals a regular rate with a normal S1 and S2.  There are no murmurs, gallops, or rubs.  The PMI is not displaced.   Abdominal exam reveals good bowel sounds.   There is no hepatosplenomegaly or tenderness.  There are no masses.  Exam of the legs reveal no clubbing, cyanosis, or edema.    The distal pulses are intact.  Cranial nerves II - XII are intact.  Motor and sensory functions are intact.   LABORATORY DATA: Lab Results  Component Value Date   CHOL 113 03/16/2015   HDL 34* 03/16/2015   LDLCALC 51 03/16/2015   LDLDIRECT 66 11/04/2013   TRIG 140 03/16/2015   CHOLHDL 3.3 03/16/2015   ECG today demonstrates normal sinus rhythm with first degree AV block. PR interval has progressively increased and is now 386 msec. Rate is 77beats per minute. It is otherwise  normal. I have personally reviewed and interpreted this study.   Assessment / Plan: 1. Coronary disease status post stenting of the proximal LAD in 2005 with a bare-metal stent. Normal Myoview study in October 2013. He is asymptomatic. We will continue with his medical management. I'll followup again in one year.  2. Hypertension. Mildly elevated. I requested that he get a BP monitor and record at home. He has a follow up with Dr. Lamar Benes soon. If BP remains elevated I would consider adding an ACEi/ARB.  3. Diabetes mellitus type 2.  4. Hyperlipidemia. Continue statin therapy.  5. Progressive PR prolongation. I am concerned about his risk of developing heart block. Will reduce metoprolol to 50 mg bid.

## 2015-11-01 NOTE — Patient Instructions (Signed)
Decrease your metoprolol to 50 mg bid.   Continue your other medication  Get a blood pressure monitor and record your BP at home. If it is staying high we may need to consider alternative therapy  I will see you in one year

## 2015-11-16 ENCOUNTER — Encounter: Payer: Self-pay | Admitting: Family Medicine

## 2015-11-16 ENCOUNTER — Ambulatory Visit (INDEPENDENT_AMBULATORY_CARE_PROVIDER_SITE_OTHER): Payer: Medicare Other | Admitting: Family Medicine

## 2015-11-16 VITALS — BP 138/47 | HR 56 | Temp 97.5°F | Ht 71.0 in | Wt 229.0 lb

## 2015-11-16 DIAGNOSIS — I10 Essential (primary) hypertension: Secondary | ICD-10-CM | POA: Diagnosis not present

## 2015-11-16 DIAGNOSIS — I259 Chronic ischemic heart disease, unspecified: Secondary | ICD-10-CM

## 2015-11-16 DIAGNOSIS — E1169 Type 2 diabetes mellitus with other specified complication: Secondary | ICD-10-CM

## 2015-11-16 DIAGNOSIS — E1165 Type 2 diabetes mellitus with hyperglycemia: Secondary | ICD-10-CM | POA: Diagnosis not present

## 2015-11-16 DIAGNOSIS — E113293 Type 2 diabetes mellitus with mild nonproliferative diabetic retinopathy without macular edema, bilateral: Secondary | ICD-10-CM | POA: Diagnosis not present

## 2015-11-16 DIAGNOSIS — IMO0002 Reserved for concepts with insufficient information to code with codable children: Secondary | ICD-10-CM

## 2015-11-16 LAB — POCT GLYCOSYLATED HEMOGLOBIN (HGB A1C): Hemoglobin A1C: 7.8

## 2015-11-16 MED ORDER — METOPROLOL TARTRATE 50 MG PO TABS: 50.0000 mg | ORAL_TABLET | Freq: Two times a day (BID) | ORAL | Status: DC

## 2015-11-16 MED ORDER — HYDROCHLOROTHIAZIDE 25 MG PO TABS: 25.0000 mg | ORAL_TABLET | Freq: Every day | ORAL | Status: DC

## 2015-11-16 MED ORDER — ATORVASTATIN CALCIUM 40 MG PO TABS: ORAL_TABLET | ORAL | Status: DC

## 2015-11-16 MED ORDER — METFORMIN HCL 500 MG PO TABS: 500.0000 mg | ORAL_TABLET | Freq: Two times a day (BID) | ORAL | Status: DC

## 2015-11-16 MED ORDER — LISINOPRIL 5 MG PO TABS: 5.0000 mg | ORAL_TABLET | Freq: Every day | ORAL | Status: DC

## 2015-11-16 MED ORDER — AMLODIPINE BESYLATE 5 MG PO TABS: ORAL_TABLET | ORAL | Status: DC

## 2015-11-16 NOTE — Progress Notes (Signed)
   Subjective:    Patient ID: Colin Newman, male    DOB: [birth year], 82 y.o.   MRN: [id number]  HPI  CC: follow up  # Diabetes  Current medications: metformin 500 twice daily   Insulin: none  Adherent to medications: not that he remembers  Symptomatic high/low blood sugars: few high around christmas  CBG monitoring: average in morning around 150  Foot exam: due May 2017.   Eye exam: has an appointment today  Microalbuminemia: due Jan 2017 (now starting lisinopril for HTN)  # Hypertension:  On amlodipine and hydrochlorothiazide, metoprolol 100mg  BID (he was instructed to take 50mg  2 weeks ago but felt weird so he went back initially  Has home BP cuff, says SBP in 150-160s, up to 185 SBP ROS: no HA, no dizziness, no CP, no SOB  Social Hx: former smoker  Review of Systems   See HPI for ROS.   Past medical history, surgical, family, and social history reviewed and updated in the EMR as appropriate. Objective:  BP 138/47 mmHg  Pulse 56  Temp(Src) 97.5 F (36.4 C) (Oral)  Ht 5\' 11"  (1.803 m)  Wt 229 lb (103.874 kg)  BMI 31.95 kg/m2 Vitals and nursing note reviewed  Checked home BP cuff vs in-office, home BP cuff measured 158/76, manual by me 142/60.  General: NAD  CV: RRR, normal s1s2 no murmurs/rub/gallop 2+ radial pulses bilaterally  Resp: clear to auscultation bilaterally normal effort Ext: no edema, has right foot brace on  Neuro: alert and oriented, no focal deficits, normal gait  Assessment & Plan:  Diabetes mellitus type 2, uncontrolled Good control, A1c 7.8. Discussed increasing metformin to 1000mg  BID, he would like to wait until next check. Follow up 3 months.  Essential hypertension Not at goal on his home BP readings (home BP cuff seems to be about 10-15 higher than check in clinic). Will start lisinopril 5mg , check BMP next week, follow up 3 months. Continue home BP readings.

## 2015-11-16 NOTE — Assessment & Plan Note (Signed)
Good control, A1c 7.8. Discussed increasing metformin to 1000mg  BID, he would like to wait until next check. Follow up 3 months.

## 2015-11-16 NOTE — Assessment & Plan Note (Signed)
Not at goal on his home BP readings (home BP cuff seems to be about 10-15 higher than check in clinic). Will start lisinopril 5mg , check BMP next week, follow up 3 months. Continue home BP readings.

## 2015-11-26 ENCOUNTER — Other Ambulatory Visit: Payer: Medicare Other

## 2015-11-26 DIAGNOSIS — I10 Essential (primary) hypertension: Secondary | ICD-10-CM | POA: Diagnosis not present

## 2015-11-26 LAB — BASIC METABOLIC PANEL WITH GFR
BUN: 16 mg/dL (ref 7–25)
CHLORIDE: 103 mmol/L (ref 98–110)
CO2: 25 mmol/L (ref 20–31)
Calcium: 8.8 mg/dL (ref 8.6–10.3)
Creat: 0.97 mg/dL (ref 0.70–1.11)
GFR, Est African American: 85 mL/min (ref >=60)
GFR, Est Non African American: 73 mL/min (ref >=60)
Glucose, Bld: 209 mg/dL — ABNORMAL HIGH (ref 65–99)
POTASSIUM: 4 mmol/L (ref 3.5–5.3)
SODIUM: 137 mmol/L (ref 135–146)

## 2015-11-26 NOTE — Progress Notes (Signed)
Bmp done today Colin Newman 

## 2015-12-03 ENCOUNTER — Telehealth: Payer: Self-pay | Admitting: Cardiology

## 2015-12-03 DIAGNOSIS — I10 Essential (primary) hypertension: Secondary | ICD-10-CM

## 2015-12-03 MED ORDER — LOSARTAN POTASSIUM 25 MG PO TABS: 25.0000 mg | ORAL_TABLET | Freq: Every day | ORAL | Status: DC

## 2015-12-03 NOTE — Telephone Encounter (Signed)
Returned call to patient.He stated since metoprolol decreased B/P has been elevated.B/P this morning 179/85 pulse 71.Spoke to Dr.Jordan he advised start losartan 25 mg daily.Bmet in 2 weeks lab order mailed.

## 2015-12-03 NOTE — Telephone Encounter (Signed)
Pt says his blood pressure is running high,it is in the 160 and 170.Please call to advise.

## 2015-12-06 ENCOUNTER — Encounter: Payer: Self-pay | Admitting: Family Medicine

## 2015-12-20 DIAGNOSIS — I10 Essential (primary) hypertension: Secondary | ICD-10-CM | POA: Diagnosis not present

## 2015-12-21 LAB — BASIC METABOLIC PANEL
BUN: 17 mg/dL (ref 7–25)
CALCIUM: 8.4 mg/dL — AB (ref 8.6–10.3)
CHLORIDE: 100 mmol/L (ref 98–110)
CO2: 26 mmol/L (ref 20–31)
CREATININE: 0.94 mg/dL (ref 0.70–1.11)
GLUCOSE: 194 mg/dL — AB (ref 65–99)
Potassium: 4.6 mmol/L (ref 3.5–5.3)
SODIUM: 139 mmol/L (ref 135–146)

## 2015-12-30 ENCOUNTER — Telehealth: Payer: Self-pay | Admitting: Cardiology

## 2015-12-30 NOTE — Telephone Encounter (Signed)
Returned call to patient.He stated since he started on losartan 25 mg daily 12/03/15 he has been dizzy.Stated B/P has been elevated 169/84 pulse 73,157/77 pulse 69,159/70.Spoke to [employer] he advised to stop losartan,increase amlodipine to 10 mg daily.Advised to monitor B/P and call back if elevated or continues to be dizzy.

## 2015-12-30 NOTE — Telephone Encounter (Signed)
Please call,says he need to talk to you.

## 2016-01-06 ENCOUNTER — Telehealth: Payer: Self-pay | Admitting: Cardiology

## 2016-01-06 NOTE — Telephone Encounter (Signed)
Pt called,he said you were supposed to have called him Tuesday. Please call,when you have time.

## 2016-01-06 NOTE — Telephone Encounter (Signed)
Returned call to patient.Stated he feels better since he stopped Losartan 3/16, except he is a little dizzy when he first gets up in morning only last about 5 mins.Stated B/P is elevated 161/73 pulse 67,180/91 p 70,173/79 p 76,163/79 p 77,171/82.Medication reviewed he is taking Amlodipine 10 mg daily,Metoprolol 50 mg twice a day.He requested appointment.Appointment scheduled with Rosaria Ferries PA 01/11/16 at 8:30 am.Stated he wanted to ask Dr.Jordan if he needs to adjust medication before he sees PA.Message sent to [place] for advice.

## 2016-01-07 MED ORDER — CHLORTHALIDONE 25 MG PO TABS: 25.0000 mg | ORAL_TABLET | Freq: Every day | ORAL | Status: DC

## 2016-01-07 NOTE — Telephone Encounter (Signed)
Returned call to patient.Dr.Jordan advised start Chlorthalidone 25 mg daily.Continue all other medications.Keep appointment with Rosaria Ferries PA 01/11/16 at 8:30 am.Advised to bring B/P readings.

## 2016-01-07 NOTE — Addendum Note (Signed)
Addended by: Kathyrn Lass on: 01/07/2016 02:29 PM   Modules accepted: Orders

## 2016-01-07 NOTE — Telephone Encounter (Signed)
I would start him on chlorthalidone 25 mg daily. He can then see Suanne Marker next week.  Peter [name] MD, [facility]

## 2016-01-11 ENCOUNTER — Ambulatory Visit (INDEPENDENT_AMBULATORY_CARE_PROVIDER_SITE_OTHER): Payer: Medicare Other | Admitting: Physician Assistant

## 2016-01-11 ENCOUNTER — Encounter: Payer: Self-pay | Admitting: Physician Assistant

## 2016-01-11 VITALS — BP 145/70 | HR 68 | Ht 71.0 in | Wt 215.0 lb

## 2016-01-11 DIAGNOSIS — I951 Orthostatic hypotension: Secondary | ICD-10-CM

## 2016-01-11 DIAGNOSIS — I10 Essential (primary) hypertension: Secondary | ICD-10-CM

## 2016-01-11 DIAGNOSIS — I2510 Atherosclerotic heart disease of native coronary artery without angina pectoris: Secondary | ICD-10-CM | POA: Diagnosis not present

## 2016-01-11 DIAGNOSIS — I259 Chronic ischemic heart disease, unspecified: Secondary | ICD-10-CM

## 2016-01-11 NOTE — Progress Notes (Signed)
Cardiology Office Note   Date:  01/11/2016   ID:  GAL FELDHAUS, DOB 01/28/1933, MRN [id number]  PCP:  Tawanna Sat, MD  Cardiologist:  Dr [name]  Tenicia Gural, PA-C   Chief Complaint  Patient presents with  . Dizziness    BP running high, no chest pain, no swelling or cramping, no shortness of breath    History of Present Illness: Colin Newman is a 82 y.o. male with a history of stent LAD 2005, HTN, DM, HLD, nl MV 2013, 1st degree AVB on ECG (progressing)  Colin Newman presents for Evaluation of orthostatic dizziness.  He saw Dr. [name] in January 2017. At that time, Dr. [name] was concerned about progressively widening first degree AV block. In 2013, his pedal or interval was 246 ms. In 2016, his PR interval was 386 ms. Because of this, his metoprolol was decreased from 100 mg twice a day down to 50 mHz twice a day.   A few weeks after that time, Colin Newman began noticing orthostatic dizziness. He has not lost consciousness, but the first episode was quite significant and was associated with extreme dizziness, was barely able to walk for about a minute. Since that time, the episodes have not been as bad. They occur consistently when he stands up out of a chair gets up out of bed. They resolved in less than a minute. He has not had extreme dizziness he had the first episode.  His baseline blood pressure had been running high. After the episodes began, his amlodipine was increased from 10 mg daily up to 10 mg twice a day by Dr. Benay Spice. He then had chlorthalidone 25 mg added to his medication regimen. Colin Newman notices the high blood pressures mostly in the morning prior to taking his medications. He is compliant with his medications including the metoprolol 50 mg twice a day. Prior to taking his blood pressure medications, his blood pressure will be 845 or 364 systolic. Both before and after he takes his blood pressure medications, the orthostatic dizziness is  present.  His blood pressure was 680 systolic this morning. In the office, his blood pressure is 145/70 with a heart rate of 64 lying but dropped to 115/62 with a heart rate of 71 standing. He was symptomatic with this.  He otherwise feels he is doing very well. He was concerned about the dizziness so has not been exercising regularly but has not had dyspnea on exertion, chest pain or shortness of breath with any exertion. He is not having orthopnea or PND. He is not having lower extremity edema.   Past Medical History  Diagnosis Date  . Hypertension   . Single vessel coronary disease     s/p stent LAD 2005  . Obesity   . Diabetes mellitus type 2, noninsulin dependent ([place])        . Dyslipidemia     Hx with intolerance to statin therapy  . Constipation     Past Surgical History  Procedure Laterality Date  . Other surgical history      Stent in the proximal left anterior descending in 2005  . Bilateral knee arthroscopy    . Back surgery      2 previous  . Cardiovascular stress test  07-20-2009    EF 73%    Current Outpatient Prescriptions  Medication Sig Dispense Refill  . amLODipine (NORVASC) 10 MG tablet Take 1 tablet (10 mg total) by mouth BID 30 tablet 6  .  aspirin 81 MG tablet Take 81 mg by mouth daily.      [name] atorvastatin (LIPITOR) 40 MG tablet TAKE ONE TABLET BY MOUTH ONCE DAILY 90 tablet 1  . Blood Glucose Monitoring Suppl (ONE TOUCH ULTRA SYSTEM KIT) W/DEVICE KIT 1 kit by Does not apply route once. 100 each 6  . chlorthalidone (HYGROTON) 25 MG tablet Take 1 tablet (25 mg total) by mouth daily. 30 tablet 6  . glucose blood (ONE TOUCH ULTRA TEST) test strip Use for blood sugar checks twice daily. 100 each 11  . metFORMIN (GLUCOPHAGE) 500 MG tablet Take 1 tablet (500 mg total) by mouth 2 (two) times daily with a meal. 180 tablet 1  . metoprolol (LOPRESSOR) 50 MG tablet Take 1 tablet (50 mg total) by mouth 2 (two) times daily. 180 tablet 1  . Multiple Vitamin  (MULTI-VITAMIN DAILY PO) Take 1 tablet by mouth 1 day or 1 dose.    . nitroGLYCERIN (NITROSTAT) 0.4 MG SL tablet Place 1 tablet (0.4 mg total) under the tongue every 5 (five) minutes as needed for chest pain. 25 tablet 11  . Omega-3 Fatty Acids (FISH OIL) 1200 MG CAPS Take 1 capsule by mouth daily.      Glory Rosebush DELICA LANCETS 02H MISC Use to check blood sugar twice daily. 100 each 11  . saw palmetto 500 MG capsule Take 500 mg by mouth daily.     No current facility-administered medications for this visit.    Allergies:   Lisinopril and Hctz    Social History:  The patient  reports that he has quit smoking. He has never used smokeless tobacco. He reports that he does not drink alcohol.   Family History:  The patient's family history includes Cancer in his father; Pulmonary embolism in his mother.    ROS:  Please see the history of present illness. All other systems are reviewed and negative.    PHYSICAL EXAM: VS:  BP 145/70 mmHg  Pulse 68  Ht 5' 11"  (1.803 m)  Wt 215 lb (97.523 kg)  BMI 30.00 kg/m2 , BMI Body mass index is 30 kg/(m^2). GEN: Well nourished, well developed, male in no acute distress HEENT: normal for age  Neck: no JVD, no carotid bruit, no masses Cardiac: RRR; no murmur, no rubs, or gallops Respiratory:  clear to auscultation bilaterally, normal work of breathing GI: soft, nontender, nondistended, + BS MS: no deformity or atrophy; no edema; distal pulses are 2+ in all 4 extremities  Skin: warm and dry, no rash Neuro:  Strength and sensation are intact Psych: euthymic mood, full affect   EKG:  EKG is not ordered today.  Recent Labs: 03/16/2015: ALT 17 12/20/2015: BUN 17; Creat 0.94; Potassium 4.6; Sodium 139    Lipid Panel    Component Value Date/Time   CHOL 113 03/16/2015 1607   TRIG 140 03/16/2015 1607   HDL 34* 03/16/2015 1607   CHOLHDL 3.3 03/16/2015 1607   VLDL 28 03/16/2015 1607   LDLCALC 51 03/16/2015 1607   LDLDIRECT 66 11/04/2013 1032      Wt Readings from Last 3 Encounters:  01/11/16 215 lb (97.523 kg)  11/16/15 229 lb (103.874 kg)  11/01/15 233 lb 1 oz (105.716 kg)     Other studies Reviewed: Additional studies/ records that were reviewed today include: Office notes and other records.  ASSESSMENT AND PLAN:  1.  Orthostatic dizziness: He is on several medications that have a side effect of dizziness. However, the symptoms began before the  amlodipine was increased and before the chlorthalidone was added. He associates the onset of the orthostatic dizziness with decrease in his metoprolol. He has not had any other episodes of dizziness or presyncope. No carotid bruits are noted. He may have baroreceptor insensitivity.   We will try decreasing the amlodipine from 10 mg twice a day down to 10 mg daily. If this does not work, we will increase the metoprolol slightly from 50 mg twice a day up to 75 mg twice a day. He will then come in for an ECG to make sure his PR interval is not continuing to increase. He will come in for a visit after that to see how he is doing and to see what additional medication changes may be needed. He is not having any evidence of dehydration on physical exam, and the symptoms began prior to the initiation of chlorthalidone. They did not change after he was started on the chlorthalidone, so do not think the chlorthalidone is involved.  If manipulating the medications does not work, we will pursue mechanical interventions including compression stockings, elevating the head of the bed and making sure he has adequate by mouth intake.  2. CAD: He is not having any ischemic symptoms. He is otherwise doing well, watching his diet and doing what he can do to control his risk factors.   Current medicines are reviewed at length with the patient today.  The patient does not have concerns regarding medicines.  The following changes have been made:  Decrease amlodipine and if that does not work, increase  metoprolol  Labs/ tests ordered today include:  No orders of the defined types were placed in this encounter.     Disposition:   FU with me in a month and with Dr. [name] as scheduled  Signed, Lenoard Aden  01/11/2016 10:16 AM    [place] Phone: [phone]; Fax: [phone]  This note was written with the assistance of speech recognition software. Please excuse any transcriptional errors.

## 2016-01-11 NOTE — Patient Instructions (Signed)
Medication Instructions:  Your physician has recommended you make the following change in your medication:  1) REDUCE Amlodipine to 10mg  daily 2) In 2 weeks if your symptoms have NOT resolved, INCREASE Metoprolol to 75mg  twice daily   Labwork: None ordered  Testing/Procedures: None ordered  Follow-Up: Your physician recommends that you schedule a follow-up appointment in: 4 weeks with an EKG with Armanda Heritage    Any Other Special Instructions Will Be Listed Below (If Applicable).     If you need a refill on your cardiac medications before your next appointment, please call your pharmacy.

## 2016-02-07 ENCOUNTER — Encounter: Payer: Self-pay | Admitting: Physician Assistant

## 2016-02-07 ENCOUNTER — Ambulatory Visit (INDEPENDENT_AMBULATORY_CARE_PROVIDER_SITE_OTHER): Payer: Medicare Other | Admitting: Physician Assistant

## 2016-02-07 VITALS — BP 138/83 | HR 80 | Ht 71.0 in | Wt 173.4 lb

## 2016-02-07 DIAGNOSIS — I2510 Atherosclerotic heart disease of native coronary artery without angina pectoris: Secondary | ICD-10-CM | POA: Diagnosis not present

## 2016-02-07 DIAGNOSIS — I10 Essential (primary) hypertension: Secondary | ICD-10-CM | POA: Diagnosis not present

## 2016-02-07 DIAGNOSIS — I259 Chronic ischemic heart disease, unspecified: Secondary | ICD-10-CM

## 2016-02-07 DIAGNOSIS — I441 Atrioventricular block, second degree: Secondary | ICD-10-CM | POA: Diagnosis not present

## 2016-02-07 MED ORDER — LOSARTAN POTASSIUM 25 MG PO TABS: 25.0000 mg | ORAL_TABLET | Freq: Every day | ORAL | Status: DC

## 2016-02-07 MED ORDER — NITROGLYCERIN 0.4 MG SL SUBL: 0.4000 mg | SUBLINGUAL_TABLET | SUBLINGUAL | Status: DC | PRN

## 2016-02-07 NOTE — Progress Notes (Signed)
Cardiology Office Note   Date:  02/07/2016   ID:  Colin Newman, DOB [birth year], MRN [id number]  PCP:  Colin Sat, MD  Cardiologist:  Dr Colin Newman  Colin Selke, PA-C   Chief Complaint  Patient presents with  . Follow-up    SOB;none CHEST PAIN;none LIGHTHEADED/DIZZINESS;none PAIN OR CRAMPING IN LEGS;none  EDEMA;    History of Present Illness: Colin Newman is a 82 y.o. male with a history of stent LAD 2005, HTN, DM, HLD, nl MV 2013, 1st degree AVB on ECG (progressing from 264>>386 ms from 2014>>2017).  He was seen in the office 03/28 for orthostatic dizziness. His amlodipine was decreased from 10 mg bid to 10 mg qd, his metoprolol was increased from 50 mg bid to 75 mg bid (at his request). There was concern he might need mechanical interventions including compression stockings, elevating the head of the bed and making sure he has adequate by mouth intake if this does not help.   Colin Newman presents for f/u of the orthostatic dizziness.   Since he was last seen in the office, his medications have changed. He is off the amlodipine. He has a bottle of lisinopril with him but is not sure if he is taking it or not. He has a bottle of losartan 25 mg that he is willing to try. He does not wish to take the chlorthalidone anymore because it is expensive. He went up on the metoprolol to 75 mg a.m., 50 mg p.m.   Since all these medication changes were made, he has not had any dizzy spells. However, he has continued to track his blood pressure and it ranges from the mid 244W up to 102 systolic. He is aware that this is a little too high and is willing to make some adjustments to try to bring it down.  In the office today, he was in second-degree heart block Mobitz 1 when his ECG was performed. He was completely asymptomatic and his ventricular rate was 58.  He has not had chest pain, shortness of breath or lower extremity edema. His dyspnea on exertion is at baseline.   Past  Medical History  Diagnosis Date  . Hypertension   . Single vessel coronary disease     s/p stent LAD 2005  . Obesity   . Diabetes mellitus type 2, noninsulin dependent (Colin Newman)        . Dyslipidemia     Hx with intolerance to statin therapy  . Constipation     Past Surgical History  Procedure Laterality Date  . Other surgical history      Stent in the proximal left anterior descending in 2005  . Bilateral knee arthroscopy    . Back surgery      2 previous  . Cardiovascular stress test  07-20-2009    EF 73%    Current Outpatient Prescriptions  Medication Sig Dispense Refill  . aspirin 81 MG tablet Take 81 mg by mouth daily.      [name] atorvastatin (LIPITOR) 40 MG tablet TAKE ONE TABLET BY MOUTH ONCE DAILY 90 tablet 1  . Blood Glucose Monitoring Suppl (ONE TOUCH ULTRA SYSTEM KIT) W/DEVICE KIT 1 kit by Does not apply route once. 100 each 6  . chlorthalidone (HYGROTON) 25 MG tablet Take 1 tablet (25 mg total) by mouth daily. 30 tablet 6  . glucose blood (ONE TOUCH ULTRA TEST) test strip Use for blood sugar checks twice daily. 100 each 11  . metFORMIN (GLUCOPHAGE)  500 MG tablet Take 1 tablet (500 mg total) by mouth 2 (two) times daily with a meal. 180 tablet 1  . metoprolol (LOPRESSOR) 50 MG tablet Take 1 tablet (50 mg total) by mouth 2 (two) times daily. 180 tablet 1  . Multiple Vitamin (MULTI-VITAMIN DAILY PO) Take 1 tablet by mouth 1 day or 1 dose.    . nitroGLYCERIN (NITROSTAT) 0.4 MG SL tablet Place 1 tablet (0.4 mg total) under the tongue every 5 (five) minutes as needed for chest pain. 25 tablet 11  . Omega-3 Fatty Acids (FISH OIL) 1200 MG CAPS Take 1 capsule by mouth daily.      Glory Rosebush DELICA LANCETS 29F MISC Use to check blood sugar twice daily. 100 each 11  . saw palmetto 500 MG capsule Take 500 mg by mouth daily.     No current facility-administered medications for this visit.    Allergies:   Lisinopril and Hctz    Social History:  The patient  reports that he has quit  smoking. He has never used smokeless tobacco. He reports that he does not drink alcohol.   Family History:  The patient's family history includes Cancer in his father; Pulmonary embolism in his mother.    ROS:  Please see the history of present illness. All other systems are reviewed and negative.    PHYSICAL EXAM: VS:  BP 138/83 mmHg  Pulse 80  Ht 5' 11"  (1.803 m)  Wt 173 lb 6.4 oz (78.654 kg)  BMI 24.20 kg/m2 , BMI Body mass index is 24.2 kg/(m^2). GEN: Well nourished, well developed, male in no acute distress HEENT: normal for age  Neck: no JVD, no carotid bruit, no masses Cardiac: RRR; soft murmur, no rubs, or gallops Respiratory:  Decreased BS bases bilaterally, normal work of breathing; good air exchange GI: soft, nontender, nondistended, + BS MS: no deformity or atrophy; no edema; distal pulses are 2+ in all 4 extremities  Skin: warm and dry, no rash Neuro:  Strength and sensation are intact Psych: euthymic mood, full affect   EKG:  EKG is ordered today. The ekg ordered today demonstrates second-degree AV block Mobitz 1, ventricular rate 58   Recent Labs: 03/16/2015: ALT 17 12/20/2015: BUN 17; Creat 0.94; Potassium 4.6; Sodium 139    Lipid Panel    Component Value Date/Time   CHOL 113 03/16/2015 1607   TRIG 140 03/16/2015 1607   HDL 34* 03/16/2015 1607   CHOLHDL 3.3 03/16/2015 1607   VLDL 28 03/16/2015 1607   LDLCALC 51 03/16/2015 1607   LDLDIRECT 66 11/04/2013 1032     Wt Readings from Last 3 Encounters:  02/07/16 173 lb 6.4 oz (78.654 kg)  01/11/16 215 lb (97.523 kg)  11/16/15 229 lb (103.874 kg)     Other studies Reviewed: Additional studies/ records that were reviewed today include: Office notes, ECGs and other data  ASSESSMENT AND PLAN:  1.  Hypertension: Colin Newman feels better with the medication changes he has made. He has not been having problems with lower extremity edema and does not wish to take the diuretic because it is expensive. However,  he was advised that his blood pressure needs better control. He is willing to start the losartan at one half tablet daily and go up to a whole tablet daily if he tolerates it well. He is encouraged to continue to track his blood pressure once daily at various times.  2. Second-degree AV block Mobitz 1: He is completely asymptomatic with this. He  is tolerating the metoprolol at 75 mg a.m. and 50 mg p.m. We will continue this dose. He is to contact us if he gets any symptoms. His heart rate was regular when checked and he may be going in and out of this. Of note, on his home blood pressure readings, his heart rate has never been below 60.  3. CAD: He is not having any chest pain with activity. His nitroglycerin is 80 years old. We will give him a new prescription for this. He is encouraged to continue the aspirin, Lipitor, and beta blocker.   Current medicines are reviewed at length with the patient today.  The patient does not have concerns regarding medicines.  The following changes have been made:  Add losartan  Labs/ tests ordered today include: ECG    Disposition:   FU with Dr. [name]  Signed, Rosaria Ferries, PA-C  02/07/2016 8:17 AM    [place] Phone: [phone]; Fax: [phone]  This note was written with the assistance of speech recognition software. Please excuse any transcriptional errors.  \

## 2016-02-07 NOTE — Patient Instructions (Addendum)
Medication Instructions:  Your physician has recommended you make the following change in your medication:  1- STOP taking your chlorthalidone. 2- START losartan 12.5 mg (1/2 tablet) by mouth daily for 1 week, then take losartan 25mg  (1 tablet) by mouth daily.   Labwork: none  Testing/Procedures: none  Follow-Up: Your physician wants you to follow-up with Dr [name] at your [facility] visit in January 2018. You will receive a reminder letter in the mail two months in advance. If you don't receive a letter, please call our office to schedule the follow-up appointment.  Call the office if you have any questions or concerns.  Any Other Special Instructions Will Be Listed Below (If Applicable).     If you need a refill on your cardiac medications before your next appointment, please call your pharmacy.

## 2016-02-11 ENCOUNTER — Ambulatory Visit: Payer: Medicare Other | Admitting: Physician Assistant

## 2016-02-21 ENCOUNTER — Encounter: Payer: Self-pay | Admitting: Family Medicine

## 2016-02-21 ENCOUNTER — Ambulatory Visit (INDEPENDENT_AMBULATORY_CARE_PROVIDER_SITE_OTHER): Payer: Medicare Other | Admitting: Family Medicine

## 2016-02-21 VITALS — BP 140/53 | HR 60 | Temp 98.0°F | Ht 71.0 in | Wt 223.5 lb

## 2016-02-21 DIAGNOSIS — I259 Chronic ischemic heart disease, unspecified: Secondary | ICD-10-CM

## 2016-02-21 DIAGNOSIS — IMO0002 Reserved for concepts with insufficient information to code with codable children: Secondary | ICD-10-CM

## 2016-02-21 DIAGNOSIS — E1165 Type 2 diabetes mellitus with hyperglycemia: Secondary | ICD-10-CM | POA: Diagnosis not present

## 2016-02-21 DIAGNOSIS — I10 Essential (primary) hypertension: Secondary | ICD-10-CM | POA: Diagnosis not present

## 2016-02-21 DIAGNOSIS — E1169 Type 2 diabetes mellitus with other specified complication: Secondary | ICD-10-CM | POA: Diagnosis present

## 2016-02-21 LAB — POCT GLYCOSYLATED HEMOGLOBIN (HGB A1C): Hemoglobin A1C: 7.7

## 2016-02-21 MED ORDER — METFORMIN HCL 1000 MG PO TABS: 1000.0000 mg | ORAL_TABLET | Freq: Two times a day (BID) | ORAL | Status: DC

## 2016-02-21 MED ORDER — GLUCOSE BLOOD VI STRP: ORAL_STRIP | Status: DC

## 2016-02-21 MED ORDER — METOPROLOL TARTRATE 50 MG PO TABS: ORAL_TABLET | ORAL | Status: DC

## 2016-02-21 MED ORDER — ONETOUCH DELICA LANCETS 33G MISC: Status: DC

## 2016-02-21 NOTE — Patient Instructions (Signed)
Metformin: Take 1000mg  tablet in the morning, then 500mg  tablet in the evening.  In the 2-4 weeks increase to 1000mg  twice a day.

## 2016-02-21 NOTE — Progress Notes (Signed)
   Subjective:    Patient ID: Colin Newman, male    DOB: 1933/07/24, 82 y.o.   MRN: [id number]  HPI  CC: diabetes follow up   # Diabetes:  No symptomatic highs or lows.  States adherence with metformin. As discussed last time, he actually has increased to 1000mg  in the mornings and 500mg  at night. Denies any issues with GI intolerance with this  Eye exam: February 2017 -- sees doctor in [place], reports no changes.  ROS: no polyuria, polydipsia  # Hypertension  Increased losartan and metoprolol recently  No dizziness in past 2 weeks. He was havign dizziness prior to seeing cardiologist ROS: no CP, no SOB, no HA  Social Hx: former smoker  Review of Systems   See HPI for ROS.   Past medical history, surgical, family, and social history reviewed and updated in the EMR as appropriate. Objective:  BP 140/53 mmHg  Pulse 60  Temp(Src) 98 F (36.7 C) (Oral)  Ht 5\' 11"  (1.803 m)  Wt 223 lb 8 oz (101.379 kg)  BMI 31.19 kg/m2  SpO2 100% Vitals and nursing note reviewed  General: no apparent distress  CV: normal rate, regular rhythm, no murmurs, rubs or gallop  Resp: clear to auscultation bilaterally, normal effort  Assessment & Plan:  Diabetes mellitus type 2, uncontrolled A1c is 7.7, approx same as 3 months ago. Increase metformin to a target goal of 1000mg  twice daily, follow up 3 months.  Essential hypertension At goal, continue current regimen.

## 2016-02-23 NOTE — Assessment & Plan Note (Signed)
At goal, continue current regimen 

## 2016-02-23 NOTE — Assessment & Plan Note (Signed)
A1c is 7.7, approx same as 3 months ago. Increase metformin to a target goal of 1000mg  twice daily, follow up 3 months.

## 2016-04-17 ENCOUNTER — Telehealth: Payer: Self-pay | Admitting: Cardiology

## 2016-04-17 MED ORDER — AMLODIPINE BESYLATE 5 MG PO TABS: 5.0000 mg | ORAL_TABLET | Freq: Every day | ORAL | Status: DC

## 2016-04-17 NOTE — Telephone Encounter (Signed)
Follow up ° ° °Pt is returning call for rn  °

## 2016-04-17 NOTE — Telephone Encounter (Signed)
New message      Pt c/o medication issue:  1. Name of Medication: Losartan  2. How are you currently taking this medication (dosage and times per day)? 25 mg po daily  3. Are you having a reaction (difficulty breathing--STAT)? yes  4. What is your medication issue? Per the pt the pt is having a rash itching, dizziness and swelling of the tongue, the pt quit taken but needs to speak with the nurse.

## 2016-04-17 NOTE — Telephone Encounter (Signed)
Returned call. Pt had reaction of hives, itching to torso/arms/neck which began Mon and was progressively worse until Thursday, when he figured out this might be his Losartan and discontinued taking. Notes w/ exception of minimal itching on back of head, he is no longer having symptoms. Pt denies SOB or other more urgent symptoms, notes he'd had some tongue swelling Thursday and this is now resolved.  He is requesting an alternative medication to the Losartan. I have added this to his allergy list at his request.  BPs taken this week have been between [id number] and [id number] systolic, averaging higher since the med was discontinued.  Routed to pharmD to advise.

## 2016-04-17 NOTE — Telephone Encounter (Signed)
LMTCB about medication changes. Has he been on amlodipine in the past/why d/ced?

## 2016-04-17 NOTE — Telephone Encounter (Signed)
Spoke to pt about BP medication. He is unsure why amlodipine was stopped and he states he does not recall taking this medication. He reports that all of his allergies should be listed in our system. He is willing to try amlodipine 5mg  once daily and come to office for BP check in 2-4 weeks. Have asked he bring his log and call office if he experiences any side effects.

## 2016-05-11 ENCOUNTER — Ambulatory Visit (INDEPENDENT_AMBULATORY_CARE_PROVIDER_SITE_OTHER): Payer: Medicare Other | Admitting: Pharmacist Clinician (PhC)/ Clinical Pharmacy Specialist

## 2016-05-11 DIAGNOSIS — I10 Essential (primary) hypertension: Secondary | ICD-10-CM | POA: Diagnosis not present

## 2016-05-11 DIAGNOSIS — I259 Chronic ischemic heart disease, unspecified: Secondary | ICD-10-CM

## 2016-05-11 MED ORDER — HYDRALAZINE HCL 25 MG PO TABS: 25.0000 mg | ORAL_TABLET | Freq: Two times a day (BID) | ORAL | 3 refills | Status: DC

## 2016-05-11 MED ORDER — AMLODIPINE BESYLATE 5 MG PO TABS: 2.5000 mg | ORAL_TABLET | Freq: Every day | ORAL | 3 refills | Status: DC

## 2016-05-11 NOTE — Assessment & Plan Note (Signed)
Today his BP is still elevated.  He is taking the amlodipine 5 mg, but does not like the dizziness associated with it.  It appears to be orthostatic in nature, he notices it mostly when he changes position.  For now I will have him cut that back to 2.5 mg nightly and add hydralazine 25 mg twice daily.  He will return in 3 weeks for follow up.  He knows to call the office prior to then should he have problems or his pressure remains consistently elevated

## 2016-05-11 NOTE — Patient Instructions (Signed)
Return for a a follow up appointment on August 22  Your blood pressure today is 176/68  (goal is < 140/90 because of Diabetes)  Check your blood pressure at home daily and keep record of the readings.  Take your BP meds as follows:  Continue with metoprolol 50 mg each morning and 75 mg each evening  Decrease amlodipine to 2.5 mg for about a week, if it still causes dizziness, just stop  Start hydralazine 25 mg twice daily.    Bring all of your meds, your BP cuff and your record of home blood pressures to your next appointment.  Exercise as you're able, try to walk approximately 30 minutes per day.  Keep salt intake to a minimum, especially watch canned and prepared boxed foods.  Eat more fresh fruits and vegetables and fewer canned items.  Avoid eating in fast food restaurants.    HOW TO TAKE YOUR BLOOD PRESSURE: . Rest 5 minutes before taking your blood pressure. .  Don't smoke or drink caffeinated beverages for at least 30 minutes before. . Take your blood pressure before (not after) you eat. . Sit comfortably with your back supported and both feet on the floor (don't cross your legs). . Elevate your arm to heart level on a table or a desk. . Use the proper sized cuff. It should fit smoothly and snugly around your bare upper arm. There should be enough room to slip a fingertip under the cuff. The bottom edge of the cuff should be 1 inch above the crease of the elbow. . Ideally, take 3 measurements at one sitting and record the average.

## 2016-05-11 NOTE — Progress Notes (Signed)
05/11/2016 Colin Newman 1932/11/27 [id number]   HPI:  Colin Newman is a 82 y.o. male patient of Dr [name], with a [place] below who presents today for hypertension clinic evaluation.  Per patient he has only had hypertensive issues over the past year, but he has had problems with various medications and has not been able to get it under control.  He complains of dizziness with amlodipine, and switching it to bedtime has improved this some, but he still notes it with positional changes throughout the day.   Blood Pressure Goal:  140/90     DM  Current Medications:  Amlodipine 5 mg qpm  Metoprolol 50 mg qam, 75 mg qpm  Cardiac Hx:  PCI to LAD 2005, hyperlipidemia, DM, first degree AV block  Family Hx:  Unsure of parents medical history ("they died a long time ago"), does recall that mother died from embolic stroke.  1 brother and 3 children, none with any cardiac issues  Social Hx:  Quit smoking 30+ years ago, drinks 2-3 beers per month, 1 cup of coffee per day, no other caffeine  Diet:  Eats mostly home cooked, lives alone.  Does not use salt when cooking or at table.    Exercise:  Stays active doing yard work.   Tries to walk daily, but weather sometimes a barrier.  Home BP readings:  Average 156/78, range 146-172/65-89  Intolerances:   Amlodipine causes dizziness   Losartan caused rash, angioedema  Lisinopril caused itching  HCTZ caused itching per chart  Chlorthalidone was cost prohibitive  Wt Readings from Last 3 Encounters:  05/11/16 225 lb (102.1 kg)  02/21/16 223 lb 8 oz (101.4 kg)  02/07/16 173 lb 6.4 oz (78.7 kg)   BP Readings from Last 3 Encounters:  05/11/16 (!) 176/68  02/21/16 (!) 140/53  02/07/16 138/83   Pulse Readings from Last 3 Encounters:  05/11/16 64  02/21/16 60  02/07/16 80    Current Outpatient Prescriptions  Medication Sig Dispense Refill  . amLODipine (NORVASC) 5 MG tablet Take 0.5 tablets (2.5 mg total) by mouth daily. 15 tablet  3  . aspirin 81 MG tablet Take 81 mg by mouth daily.      [name] atorvastatin (LIPITOR) 40 MG tablet TAKE ONE TABLET BY MOUTH ONCE DAILY 90 tablet 1  . Blood Glucose Monitoring Suppl (ONE TOUCH ULTRA SYSTEM KIT) W/DEVICE KIT 1 kit by Does not apply route once. 100 each 6  . glucose blood (ONE TOUCH ULTRA TEST) test strip Use for blood sugar checks once daily. 100 each 11  . hydrALAZINE (APRESOLINE) 25 MG tablet Take 1 tablet (25 mg total) by mouth 2 (two) times daily. 60 tablet 3  . metFORMIN (GLUCOPHAGE) 1000 MG tablet Take 1 tablet (1,000 mg total) by mouth 2 (two) times daily with a meal. 180 tablet 1  . metoprolol (LOPRESSOR) 50 MG tablet Take 1.5 tablets in the morning, 1 tablet at night 225 tablet 1  . Multiple Vitamin (MULTI-VITAMIN DAILY PO) Take 1 tablet by mouth 1 day or 1 dose.    . nitroGLYCERIN (NITROSTAT) 0.4 MG SL tablet Place 1 tablet (0.4 mg total) under the tongue every 5 (five) minutes as needed for chest pain. 25 tablet 11  . Omega-3 Fatty Acids (FISH OIL) 1200 MG CAPS Take 1 capsule by mouth daily.      Glory Rosebush DELICA LANCETS 22L MISC Use to check blood sugar once daily. 100 each 11  . saw palmetto 500  MG capsule Take 500 mg by mouth daily.     No current facility-administered medications for this visit.     Allergies  Allergen Reactions  . Lisinopril Hives, Itching and Cough  . Losartan Hives    Hives, itching, swelling of face and tongue associated w/ taking this medication.  [name] Hctz [Hydrochlorothiazide] Itching    Past Medical History:  Diagnosis Date  . Constipation   . Diabetes mellitus type 2, noninsulin dependent ([place])       . Dyslipidemia    Hx with intolerance to statin therapy  . Hypertension   . Obesity   . Single vessel coronary disease    s/p stent LAD 2005    Blood pressure (!) 176/68, pulse 64, height 5' 11"  (1.803 m), weight 225 lb (102.1 kg).    Tommy Medal PharmD CPP [facility]

## 2016-05-12 ENCOUNTER — Encounter: Payer: Self-pay | Admitting: Pharmacist Clinician (PhC)/ Clinical Pharmacy Specialist

## 2016-05-18 ENCOUNTER — Other Ambulatory Visit: Payer: Self-pay | Admitting: Family Medicine

## 2016-06-06 ENCOUNTER — Ambulatory Visit (INDEPENDENT_AMBULATORY_CARE_PROVIDER_SITE_OTHER): Payer: Medicare Other | Admitting: Pharmacist Clinician (PhC)/ Clinical Pharmacy Specialist

## 2016-06-06 ENCOUNTER — Encounter: Payer: Self-pay | Admitting: Pharmacist Clinician (PhC)/ Clinical Pharmacy Specialist

## 2016-06-06 ENCOUNTER — Encounter (INDEPENDENT_AMBULATORY_CARE_PROVIDER_SITE_OTHER): Payer: Self-pay

## 2016-06-06 VITALS — BP 172/70 | HR 76 | Ht 71.0 in | Wt 226.6 lb

## 2016-06-06 DIAGNOSIS — I259 Chronic ischemic heart disease, unspecified: Secondary | ICD-10-CM | POA: Diagnosis not present

## 2016-06-06 DIAGNOSIS — I10 Essential (primary) hypertension: Secondary | ICD-10-CM

## 2016-06-06 MED ORDER — SPIRONOLACTONE 25 MG PO TABS: 25.0000 mg | ORAL_TABLET | Freq: Every day | ORAL | 3 refills | Status: DC

## 2016-06-06 NOTE — Progress Notes (Signed)
06/06/2016 Colin Newman 1932-12-23 [id number]   HPI:  Colin Newman is a 82 y.o. male patient of Dr [name], with a [place] below who presents today for hypertension clinic follow up.  Per patient he has only had hypertensive issues over the past year, but he has had problems with various medications and has not been able to get it under control.  He complained of dizziness with amlodipine and despite decreasing dose to 2.5 mg at bedtime, he discontinued this since our last visit.  See below for his list of medication intolerances.    Today he complains that despite taking hydralazine, his BP is "higher than ever".  He does not take into account that the amlodipine was probably giving some benefit, so when he stopped it, his pressure rose.   Seems to believe that there should be one tablet, without side effects, that will take care of his elevated pressures.  He also is worried about his heart rate being too high.  Home readings are consistently in the 70-85 range.  Assured him these are perfectly normal readings.    Blood Pressure Goal:  140/90     DM  Current Medications:  Hydralazine 25 mg bid  Metoprolol 50 mg qam, 75 mg qpm  Cardiac Hx:  PCI to LAD 2005, hyperlipidemia, DM, first degree AV block  Family Hx:  Unsure of parents medical history ("they died a long time ago"), does recall that mother died from embolic stroke.  1 brother and 3 children, none with any cardiac issues  Social Hx:  Quit smoking 30+ years ago, drinks 2-3 beers per month, 1 cup of coffee per day, no other caffeine  Diet:  Eats mostly home cooked, lives alone.  Does not use salt when cooking or at table.    Exercise:  Stays active doing yard work.   Tries to walk daily, but weather sometimes a barrier.  Home BP readings:  Average 156/78 (same as last month), range 127-183/68-90  Intolerances:   Amlodipine causes dizziness   Losartan caused rash, angioedema  Lisinopril caused itching  HCTZ caused  itching per chart  Chlorthalidone was cost prohibitive  Wt Readings from Last 3 Encounters:  06/06/16 226 lb 9.6 oz (102.8 kg)  05/11/16 225 lb (102.1 kg)  02/21/16 223 lb 8 oz (101.4 kg)   BP Readings from Last 3 Encounters:  06/06/16 (!) 172/70  05/11/16 (!) 176/68  02/21/16 (!) 140/53   Pulse Readings from Last 3 Encounters:  06/06/16 76  05/11/16 64  02/21/16 60    Current Outpatient Prescriptions  Medication Sig Dispense Refill  . amLODipine (NORVASC) 5 MG tablet Take 0.5 tablets (2.5 mg total) by mouth daily. 15 tablet 3  . aspirin 81 MG tablet Take 81 mg by mouth daily.      [name] atorvastatin (LIPITOR) 40 MG tablet TAKE ONE TABLET BY MOUTH ONCE DAILY 90 tablet 0  . Blood Glucose Monitoring Suppl (ONE TOUCH ULTRA SYSTEM KIT) W/DEVICE KIT 1 kit by Does not apply route once. 100 each 6  . glucose blood (ONE TOUCH ULTRA TEST) test strip Use for blood sugar checks once daily. 100 each 11  . hydrALAZINE (APRESOLINE) 25 MG tablet Take 1 tablet (25 mg total) by mouth 2 (two) times daily. 60 tablet 3  . metFORMIN (GLUCOPHAGE) 1000 MG tablet Take 1 tablet (1,000 mg total) by mouth 2 (two) times daily with a meal. 180 tablet 1  . metoprolol (LOPRESSOR) 50 MG tablet Take  1.5 tablets in the morning, 1 tablet at night 225 tablet 1  . Multiple Vitamin (MULTI-VITAMIN DAILY PO) Take 1 tablet by mouth 1 day or 1 dose.    . nitroGLYCERIN (NITROSTAT) 0.4 MG SL tablet Place 1 tablet (0.4 mg total) under the tongue every 5 (five) minutes as needed for chest pain. 25 tablet 11  . Omega-3 Fatty Acids (FISH OIL) 1200 MG CAPS Take 1 capsule by mouth daily.      Glory Rosebush DELICA LANCETS 21R MISC Use to check blood sugar once daily. 100 each 11  . saw palmetto 500 MG capsule Take 500 mg by mouth daily.    [name] spironolactone (ALDACTONE) 25 MG tablet Take 1 tablet (25 mg total) by mouth daily. 30 tablet 3   No current facility-administered medications for this visit.     Allergies  Allergen Reactions    . Lisinopril Hives, Itching and Cough  . Losartan Hives    Hives, itching, swelling of face and tongue associated w/ taking this medication.  [name] Hctz [Hydrochlorothiazide] Itching    Past Medical History:  Diagnosis Date  . Constipation   . Diabetes mellitus type 2, noninsulin dependent ([place])       . Dyslipidemia    Hx with intolerance to statin therapy  . Hypertension   . Obesity   . Single vessel coronary disease    s/p stent LAD 2005    Blood pressure (!) 172/70, pulse 76, height _0  (1.803 m), weight 226 lb 9.6 oz (102.8 kg).    Tommy Medal PharmD CPP [facility]

## 2016-06-06 NOTE — Patient Instructions (Addendum)
Return for a a follow up appointment in 3 weeks  Your blood pressure today is 172/70  (goal is <140/90)  Check your blood pressure at home several days each week and keep record of the readings.  Take your BP meds as follows: continue with hydralazine and metoprolol, add spironolactone 25 mg once daily  Bring all of your meds, your BP cuff and your record of home blood pressures to your next appointment.  Exercise as you're able, try to walk approximately 30 minutes per day.  Keep salt intake to a minimum, especially watch canned and prepared boxed foods.  Eat more fresh fruits and vegetables and fewer canned items.  Avoid eating in fast food restaurants.    HOW TO TAKE YOUR BLOOD PRESSURE: . Rest 5 minutes before taking your blood pressure. .  Don't smoke or drink caffeinated beverages for at least 30 minutes before. . Take your blood pressure before (not after) you eat. . Sit comfortably with your back supported and both feet on the floor (don't cross your legs). . Elevate your arm to heart level on a table or a desk. . Use the proper sized cuff. It should fit smoothly and snugly around your bare upper arm. There should be enough room to slip a fingertip under the cuff. The bottom edge of the cuff should be 1 inch above the crease of the elbow. . Ideally, take 3 measurements at one sitting and record the average.

## 2016-06-06 NOTE — Assessment & Plan Note (Addendum)
Patient with hard to treat hypertension, due mainly to allergies/sensitivities to available options.  Did develop angioedema with losartan, many others caused him itching or were cost prohibitive. He is very hesitant to continue with hydralazine, but it was stressed to him that it often takes 3 or more medications to lower pressure.  Will have him continue with hydralazine 25 mg bid and metoprolol 75 mg am/50 mg pm, and add spironolactone 25 mg once daily.  Return for follow up in 3 weeks

## 2016-06-29 ENCOUNTER — Encounter: Payer: Self-pay | Admitting: Pharmacist

## 2016-06-29 ENCOUNTER — Ambulatory Visit (INDEPENDENT_AMBULATORY_CARE_PROVIDER_SITE_OTHER): Payer: Medicare Other | Admitting: Pharmacist

## 2016-06-29 VITALS — BP 148/72 | HR 74

## 2016-06-29 DIAGNOSIS — I259 Chronic ischemic heart disease, unspecified: Secondary | ICD-10-CM | POA: Diagnosis not present

## 2016-06-29 DIAGNOSIS — I10 Essential (primary) hypertension: Secondary | ICD-10-CM

## 2016-06-29 NOTE — Progress Notes (Signed)
Patient ID: Colin Newman                 DOB: 12/27/1932                      MRN: [id number]     HPI: Colin Newman is a 82 y.o. male patient of Dr. Jordan with PMH below who presents today for hypertension follow up.  Of note his metoprolol was decreased to 50mg BID by Dr. Jordan in jan 2017 due to prolonged PR interval.   He presents today and states that he has a plan for how to proceed with his medications since he has been feeling so poorly. He states he is going to go back to metoprolol 50mg BID and only metoprolol 50 mg BID.  He states he feels the best he has felt in several days today after discontinuing spironolactone. He stopped the spironolactone last Thursday because he was waking several times per night to go to the restroom.   He reports that hydralazine causes him to be "swimmy headed." He states when he turns his head while driving he can "feel it."  He states he can tolerate it if absolutely necessary but he really is wishing to come off this medication as well.   He reports that he has previously tried Lasix PRN and this did seem to help if he was swollen. He denies being swollen, but would be willing to try lasix if his pressure remains elevated on metoprolol 50mg BID.    Cardiac Hx: PCI 2005, HLD, DM2, first degree AV block, CAD  Current HTN meds:  Spironolactone 25mg  Hydralazine 25mg BID Metoprolol 75mg qam, 50mg qpm  Previously tried:   Amlodipine - dizziness Losartan - rash/angioedemia Lisinopril - itching HCTZ - itching Chlorthalidone - cost-prohibitive Spironolactone - caused him to wake 3-4 times per night to go to the bathroom   BP goal: <140/90  Family Hx:  Unsure of parents medical history ("they died a long time ago"), does recall that mother died from embolic stroke.  1 brother and 3 children, none with any cardiac issues  Social Hx:  Quit smoking 30+ years ago, drinks 2-3 beers per month, 1 cup of coffee per day, no other caffeine  Diet:   Eats mostly home cooked, lives alone.  Does not use salt when cooking or at table.    Exercise:  Stays active doing yard work.   Tries to walk daily, but weather sometimes a barrier.  Home BP readings:  Only 1 measurement - below 150/90. Otherwise his pressures avg in high 160s/70s.   Wt Readings from Last 3 Encounters:  06/06/16 226 lb 9.6 oz (102.8 kg)  05/11/16 225 lb (102.1 kg)  02/21/16 223 lb 8 oz (101.4 kg)   BP Readings from Last 3 Encounters:  06/29/16 (!) 148/72  06/06/16 (!) 172/70  05/11/16 (!) 176/68   Pulse Readings from Last 3 Encounters:  06/29/16 74  06/06/16 76  05/11/16 64    Renal function: CrCl cannot be calculated (Unknown ideal weight.).  Past Medical History:  Diagnosis Date  . Constipation   . Diabetes mellitus type 2, noninsulin dependent (HCC)       . Dyslipidemia    Hx with intolerance to statin therapy  . Hypertension   . Obesity   . Single vessel coronary disease    s/p stent LAD 2005    Current Outpatient Prescriptions on File Prior to Visit  Medication Sig Dispense   Refill  . aspirin 81 MG tablet Take 81 mg by mouth daily.      . atorvastatin (LIPITOR) 40 MG tablet TAKE ONE TABLET BY MOUTH ONCE DAILY 90 tablet 0  . Blood Glucose Monitoring Suppl (ONE TOUCH ULTRA SYSTEM KIT) W/DEVICE KIT 1 kit by Does not apply route once. 100 each 6  . glucose blood (ONE TOUCH ULTRA TEST) test strip Use for blood sugar checks once daily. 100 each 11  . metFORMIN (GLUCOPHAGE) 1000 MG tablet Take 1 tablet (1,000 mg total) by mouth 2 (two) times daily with a meal. 180 tablet 1  . Multiple Vitamin (MULTI-VITAMIN DAILY PO) Take 1 tablet by mouth 1 day or 1 dose.    . nitroGLYCERIN (NITROSTAT) 0.4 MG SL tablet Place 1 tablet (0.4 mg total) under the tongue every 5 (five) minutes as needed for chest pain. 25 tablet 11  . Omega-3 Fatty Acids (FISH OIL) 1200 MG CAPS Take 1 capsule by mouth daily.      . ONETOUCH DELICA LANCETS 33G MISC Use to check blood sugar  once daily. 100 each 11  . saw palmetto 500 MG capsule Take 500 mg by mouth daily.     No current facility-administered medications on file prior to visit.     Allergies  Allergen Reactions  . Lisinopril Hives, Itching and Cough  . Losartan Hives    Hives, itching, swelling of face and tongue associated w/ taking this medication.  . Hctz [Hydrochlorothiazide] Itching    Blood pressure (!) 148/72, pulse 74.   Assessment/Plan: Hypertension: BP today is actually better than his home readings. He states Kristin previously verified his cuff and it was measuring appropriately. Will allow him to proceed with his plan of taking only metoprolol 50mg BID as he is convinced it is the stress of taking other medications that is driving his pressure up. Will have him follow up in 2 weeks as I am fairly confident his pressures will increase after coming off medications. At that time we may need to try lasix as he is wishing or go older agents such as clonidine (preferably the patch to decrease SE) since he has been intolerant of most classes of medications.    Thank you, Kelley M. Auten, PharmD  [facility]  06/29/2016 11:12 AM    

## 2016-06-29 NOTE — Patient Instructions (Signed)
Return for a follow up appointment in 2 weeks  Check your blood pressure at home daily (if able) and keep record of the readings.  Take your BP meds as follows: You may stop all the medications except metoprolol   Take metoprolol 50mg  (1 tablet) twice daily Continue to monitor your blood pressure   Bring all of your meds, your BP cuff and your record of home blood pressures to your next appointment.  Exercise as you're able, try to walk approximately 30 minutes per day.  Keep salt intake to a minimum, especially watch canned and prepared boxed foods.  Eat more fresh fruits and vegetables and fewer canned items.  Avoid eating in fast food restaurants.    HOW TO TAKE YOUR BLOOD PRESSURE: . Rest 5 minutes before taking your blood pressure. .  Don't smoke or drink caffeinated beverages for at least 30 minutes before. . Take your blood pressure before (not after) you eat. . Sit comfortably with your back supported and both feet on the floor (don't cross your legs). . Elevate your arm to heart level on a table or a desk. . Use the proper sized cuff. It should fit smoothly and snugly around your bare upper arm. There should be enough room to slip a fingertip under the cuff. The bottom edge of the cuff should be 1 inch above the crease of the elbow. . Ideally, take 3 measurements at one sitting and record the average.

## 2016-07-06 ENCOUNTER — Telehealth: Payer: Self-pay | Admitting: Internal Medicine

## 2016-07-06 NOTE — Telephone Encounter (Signed)
Pt is calling because the pharmacy is not giving him his medications because the doctor is not pecos enrolled. He would like [place] to call the pharmacy and get this straighten out. Since he will need this tomorrow. jw

## 2016-07-10 MED ORDER — METFORMIN HCL 1000 MG PO TABS: 1000.0000 mg | ORAL_TABLET | Freq: Two times a day (BID) | ORAL | 1 refills | Status: DC

## 2016-07-10 MED ORDER — ATORVASTATIN CALCIUM 40 MG PO TABS: 40.0000 mg | ORAL_TABLET | Freq: Every day | ORAL | 0 refills | Status: DC

## 2016-07-10 MED ORDER — METOPROLOL TARTRATE 50 MG PO TABS
ORAL_TABLET | ORAL | 1 refills | Status: DC
Start: 2016-07-10 — End: 2016-08-29

## 2016-07-10 NOTE — Telephone Encounter (Signed)
Refilled Metoprolol, Statin, and Metformin. I will need to ask one of the faculty providers to order the Lancets and Test Strips (I will ask them tomorrow)

## 2016-07-10 NOTE — Telephone Encounter (Signed)
Patient calling again, states what he needs is test strips and lancets as he is completely out. States he tests once daily. Says MD that writes rx needs to be PECOS.

## 2016-07-10 NOTE — Telephone Encounter (Signed)
Spoke with [place] and patient doesn't have any medication pending need authorization from his PCP.  She did state that he has a few that will need new refills on them since he is on his last ones.  Will forward to MD to refill his atorvastatin, metformin and metoprolol. Jazmin Hartsell,CMA

## 2016-07-10 NOTE — Progress Notes (Deleted)
Patient ID: Colin Newman                 DOB: 08-Nov-1932                      MRN: [id number]     HPI: Colin Newman is a 82 y.o. male patient of Dr. [name] with [place] below who presents today for hypertension follow up. At his last visit he discontinued all of his medications except metoprolol 39m BID.    Cardiac Hx: PCI 2005, HLD, DM2, first degree AV block, CAD  Current HTN meds:  Metoprolol 570mBID  Previously tried:   Amlodipine - dizziness Losartan - rash/angioedemia Lisinopril - itching HCTZ - itching Chlorthalidone - cost-prohibitive Spironolactone - caused him to wake 3-4 times per night to go to the bathroom  Hydralazine - caused him to be "swimmy" headed  Spironolactone - caused him to go to the bathroom 3-5 times per night  BP goal: <140/90  Family Hx:  Unsure of parents medical history ("they died a long time ago"), does recall that mother died from embolic stroke. 1 brother and 3 children, none with any cardiac issues  Social Hx:  Quit smoking 30+ years ago, drinks 2-3 beers per month, 1 cup of coffee per day, no other caffeine  Diet:  Eats mostly home cooked, lives alone. Does not use salt when cooking or at table.   Exercise:  Stays active doing yard work. Tries to walk daily, but weather sometimes a barrier.  Home BP readings:   Wt Readings from Last 3 Encounters:  06/06/16 226 lb 9.6 oz (102.8 kg)  05/11/16 225 lb (102.1 kg)  02/21/16 223 lb 8 oz (101.4 kg)   BP Readings from Last 3 Encounters:  06/29/16 (!) 148/72  06/06/16 (!) 172/70  05/11/16 (!) 176/68   Pulse Readings from Last 3 Encounters:  06/29/16 74  06/06/16 76  05/11/16 64    Renal function: CrCl cannot be calculated (Unknown ideal weight.).  Past Medical History:  Diagnosis Date  . Constipation   . Diabetes mellitus type 2, noninsulin dependent ([facility]      . Dyslipidemia    Hx with intolerance to statin therapy  . Hypertension   . Obesity   . Single vessel  coronary disease    s/p stent LAD 2005    Current Outpatient Prescriptions on File Prior to Visit  Medication Sig Dispense Refill  . aspirin 81 MG tablet Take 81 mg by mouth daily.      . Marland Kitchentorvastatin (LIPITOR) 40 MG tablet TAKE ONE TABLET BY MOUTH ONCE DAILY 90 tablet 0  . Blood Glucose Monitoring Suppl (ONE TOUCH ULTRA SYSTEM KIT) W/DEVICE KIT 1 kit by Does not apply route once. 100 each 6  . glucose blood (ONE TOUCH ULTRA TEST) test strip Use for blood sugar checks once daily. 100 each 11  . metFORMIN (GLUCOPHAGE) 1000 MG tablet Take 1 tablet (1,000 mg total) by mouth 2 (two) times daily with a meal. 180 tablet 1  . metoprolol (LOPRESSOR) 50 MG tablet Take 1 tablets in the morning, 1 tablet at night 225 tablet 1  . Multiple Vitamin (MULTI-VITAMIN DAILY PO) Take 1 tablet by mouth 1 day or 1 dose.    . nitroGLYCERIN (NITROSTAT) 0.4 MG SL tablet Place 1 tablet (0.4 mg total) under the tongue every 5 (five) minutes as needed for chest pain. 25 tablet 11  . Omega-3 Fatty Acids (FISH OIL) 1200 MG  CAPS Take 1 capsule by mouth daily.      Glory Rosebush DELICA LANCETS 94W MISC Use to check blood sugar once daily. 100 each 11  . saw palmetto 500 MG capsule Take 500 mg by mouth daily.     No current facility-administered medications on file prior to visit.     Allergies  Allergen Reactions  . Lisinopril Hives, Itching and Cough  . Losartan Hives    Hives, itching, swelling of face and tongue associated w/ taking this medication.  [name] Hctz [Hydrochlorothiazide] Itching    There were no vitals taken for this visit.   Assessment/Plan: Hypertension:    Thank you, Colin Newman, [facility]  07/10/2016 9:41 AM

## 2016-07-11 ENCOUNTER — Other Ambulatory Visit: Payer: Self-pay | Admitting: *Deleted

## 2016-07-12 MED ORDER — GLUCOSE BLOOD VI STRP
ORAL_STRIP | 11 refills | Status: DC
Start: 2016-07-12 — End: 2017-01-10

## 2016-07-13 ENCOUNTER — Other Ambulatory Visit: Payer: Self-pay | Admitting: Internal Medicine

## 2016-07-13 MED ORDER — ONETOUCH ULTRA SYSTEM W/DEVICE KIT: 1.0000 | PACK | Freq: Once | 12 refills | Status: AC

## 2016-07-13 MED ORDER — ONETOUCH DELICA LANCETS 33G MISC: 12 refills | Status: DC

## 2016-07-13 NOTE — Progress Notes (Signed)
Completed P[employer] form for refill of Lancets and Test strips. This form will be faxed to the pharmacy. Please let patient know.

## 2016-07-13 NOTE — Telephone Encounter (Signed)
Pt informed, states he was able to pick up his medications yesterday.

## 2016-07-13 NOTE — Progress Notes (Signed)
   [facility] Phone: [phone]   Date of Visit: 07/14/2016   HPI:  DM2:  - A1c 7.7 (02/2016) < 7.8 (10/2015) < 7.7 (07/2015) < 9.5 (02/2015)  - Medications: Metformin 1000mg  BID  - CBGs: AM fasting: 150-160 - reports he has not been eating not as well for the past 3 months because he was worried about his BP; has also not been as active for the past few months but restarted  - Optho: due in January   HTN:  - Medications: Hydralazine 25 mg bid, metoprolol 75 mg am/50 mg pm, spironolactone 25 mg once daily (added 06/06/2016; but discontinued as patient not tolerating). Cardiology managing and last seen 06/29/16 - has multiple drug allergies  - 140-150s/76 at home  - Denies symptoms    ROS: See HPI.  [place]:  PMH:  Obesity  HTN- difficult to treat due to multiple med allergies  DM2  HLD CAD s/p stenting proximal LAD in 2005; First Degree AV Block  BPH, ED Hx of Colon Polyp  Hx of Skin Cancer   PHYSICAL EXAM: BP (!) 142/60   Pulse 65   Temp 98.2 F (36.8 C) (Oral)   Ht 5\' 11"  (1.803 m)   Wt 224 lb (101.6 kg)   BMI 31.24 kg/m  GEN: NAD HEENT: Atraumatic, normocephalic, neck supple, EOMI, sclera clear  CV: RRR, no murmurs, rubs, or gallops PULM: CTAB, normal effort ABD: Soft, nontender, nondistended, NABS, no organomegaly SKIN: No rash or cyanosis; warm and well-perfused EXTR: No lower extremity edema or calf tenderness PSYCH: Mood and affect euthymic, normal rate and volume of speech NEURO: Awake, alert, no focal deficits grossly, normal speech   ASSESSMENT/PLAN:  Health maintenance:  -Declined Flu vaccine  Essential hypertension Very close to goal today. Continue current medications: Hydralazine 25 mg bid, metoprolol 75 mg am/50 mg pm  Diabetes mellitus type 2, uncontrolled A1c essentially stable from 10/2015. Patient reports of decreased adherence to good diet and lifestyle practices due to recent stressor regarding his blood pressure  management. Is compliant with Metformin 1000mg  BID - increase Metformin to 850 TID - urine microalbumin today  - discussed appropriate diet and lifestyle practices - continue to record CBGs - follow up in 3 months   Dyslipidemia - lipid panel ordered for future (to obtain fasting)    [name], MD PGY 2 [place]

## 2016-07-14 ENCOUNTER — Encounter: Payer: Self-pay | Admitting: Internal Medicine

## 2016-07-14 ENCOUNTER — Ambulatory Visit (INDEPENDENT_AMBULATORY_CARE_PROVIDER_SITE_OTHER): Payer: Medicare Other | Admitting: Internal Medicine

## 2016-07-14 VITALS — BP 142/60 | HR 65 | Temp 98.2°F | Ht 71.0 in | Wt 224.0 lb

## 2016-07-14 DIAGNOSIS — E785 Hyperlipidemia, unspecified: Secondary | ICD-10-CM | POA: Diagnosis not present

## 2016-07-14 DIAGNOSIS — I259 Chronic ischemic heart disease, unspecified: Secondary | ICD-10-CM | POA: Diagnosis not present

## 2016-07-14 DIAGNOSIS — I10 Essential (primary) hypertension: Secondary | ICD-10-CM

## 2016-07-14 DIAGNOSIS — IMO0002 Reserved for concepts with insufficient information to code with codable children: Secondary | ICD-10-CM

## 2016-07-14 DIAGNOSIS — E1169 Type 2 diabetes mellitus with other specified complication: Secondary | ICD-10-CM

## 2016-07-14 DIAGNOSIS — E1165 Type 2 diabetes mellitus with hyperglycemia: Secondary | ICD-10-CM

## 2016-07-14 LAB — POCT GLYCOSYLATED HEMOGLOBIN (HGB A1C): HEMOGLOBIN A1C: 7.1

## 2016-07-14 MED ORDER — METFORMIN HCL 850 MG PO TABS: 850.0000 mg | ORAL_TABLET | Freq: Three times a day (TID) | ORAL | 0 refills | Status: DC

## 2016-07-14 NOTE — Patient Instructions (Addendum)
  Diet Recommendations for Diabetes   Starchy (carb) foods include: Bread, rice, pasta, potatoes, corn, crackers, bagels, muffins, all baked goods.  (Fruits, milk, and yogurt also have carbohydrate, but most of these foods will not spike your blood sugar as the starchy foods will.)  A few fruits do cause high blood sugars; use small portions of bananas (limit to 1/2 at a time), grapes, and most tropical fruits.    Protein foods include: Meat, fish, poultry, eggs, dairy foods, and beans such as pinto and kidney beans (beans also provide carbohydrate).   1. Eat at least 3 meals and 1-2 snacks per day. Never go more than 4-5 hours while awake without eating.  2. Limit starchy foods to TWO per meal and ONE per snack. ONE portion of a starchy  food is equal to the following:   - ONE slice of bread (or its equivalent, such as half of a hamburger bun).   - 1/2 cup of a "scoopable" starchy food such as potatoes or rice.   - 15 grams of carbohydrate as shown on food label.  3. Both lunch and dinner should include a protein food, a carb food, and vegetables.   - Obtain twice as many veg's as protein or carbohydrate foods for both lunch and dinner.   - Fresh or frozen veg's are best.   - Try to keep frozen veg's on hand for a quick vegetable serving.    4. Breakfast should always include protein.    Please make a lab visit and come in fasting to get your cholesterol checked.   We increased the dose of your Metformin today. Take 1 tab (850mg ) three times a day with meals. Continue to get regular physical activity

## 2016-07-15 LAB — MICROALBUMIN / CREATININE URINE RATIO
Creatinine, Urine: 94 mg/dL (ref 20–370)
MICROALB/CREAT RATIO: 38 ug/mg{creat} — AB (ref <30)
Microalb, Ur: 3.6 mg/dL

## 2016-07-16 NOTE — Assessment & Plan Note (Signed)
A1c essentially stable from 10/2015. Patient reports of decreased adherence to good diet and lifestyle practices due to recent stressor regarding his blood pressure management. Is compliant with Metformin 1000mg  BID - increase Metformin to 850 TID - urine microalbumin today  - discussed appropriate diet and lifestyle practices - continue to record CBGs - follow up in 3 months

## 2016-07-16 NOTE — Assessment & Plan Note (Signed)
-   lipid panel ordered for future (to obtain fasting)

## 2016-07-16 NOTE — Assessment & Plan Note (Signed)
Very close to goal today. Continue current medications: Hydralazine 25 mg bid, metoprolol 75 mg am/50 mg pm

## 2016-07-17 ENCOUNTER — Other Ambulatory Visit: Payer: Medicare Other

## 2016-07-17 ENCOUNTER — Ambulatory Visit: Payer: Medicare Other

## 2016-07-17 DIAGNOSIS — E785 Hyperlipidemia, unspecified: Secondary | ICD-10-CM | POA: Diagnosis not present

## 2016-07-17 LAB — LIPID PANEL
CHOL/HDL RATIO: 3 ratio (ref <=5.0)
Cholesterol: 102 mg/dL — ABNORMAL LOW (ref 125–200)
HDL: 34 mg/dL — AB (ref >=40)
LDL CALC: 36 mg/dL (ref <130)
Triglycerides: 160 mg/dL — ABNORMAL HIGH (ref <150)
VLDL: 32 mg/dL — ABNORMAL HIGH (ref <30)

## 2016-07-25 ENCOUNTER — Ambulatory Visit (INDEPENDENT_AMBULATORY_CARE_PROVIDER_SITE_OTHER): Payer: Medicare Other | Admitting: Pharmacist

## 2016-07-25 VITALS — BP 164/72 | HR 64

## 2016-07-25 DIAGNOSIS — I10 Essential (primary) hypertension: Secondary | ICD-10-CM

## 2016-07-25 DIAGNOSIS — I259 Chronic ischemic heart disease, unspecified: Secondary | ICD-10-CM | POA: Diagnosis not present

## 2016-07-25 NOTE — Progress Notes (Signed)
Patient ID: Colin Newman                 DOB: 1933/07/17                      MRN: [id number]     HPI: Colin Newman is a 82 y.o. male patient of Dr. [name] with [place] below who presents today for hypertension follow up.  Of note his metoprolol was decreased to 50mg  BID by Dr. [name] in Jan 2017 due to prolonged PR interval. At his most recent visit he decided to stop all medications except metoprolol 50mg  BID.   He presents today and states that he has felt the best he has in a long long while. He states that though his pressures have been running high he is unwilling to try any additional medication with consultation with Dr. [name]. He states that someone from pharmacy increased his metoprolol and that Dr. [name] had cut it back for a reason.   He reports that he has previously tried Lasix PRN and this did seem to help if he was swollen. He denies being swollen, but would be willing to try lasix if his pressure remains elevated on metoprolol 50mg  BID.   Cardiac Hx: PCI 2005, HLD, DM2, first degree AV block, CAD  Current HTN meds:  Metoprolol 50mg  BID  Previously tried:   Amlodipine - dizziness Losartan - rash/angioedemia Lisinopril - itching HCTZ - itching Chlorthalidone - cost-prohibitive Spironolactone - caused him to wake 3-4 times per night to go to the bathroom  Hydralazine - caused him to be swimmy headed  BP goal: <140/90  Family Hx:  Unsure of parents medical history ("they died a long time ago"), does recall that mother died from embolic stroke. 1 brother and 3 children, none with any cardiac issues  Social Hx:  Quit smoking 30+ years ago, drinks 2-3 beers per month, 1 cup of coffee per day, no other caffeine  Diet:  Eats mostly home cooked, lives alone. Does not use salt when cooking or at table.   Exercise:  Stays active doing yard work. Tries to walk daily, but weather sometimes a barrier.  Home BP readings:  Measurements avg in low 150s/ high 70s. HR  WNL. No pressures >160/80.   Wt Readings from Last 3 Encounters:  07/14/16 224 lb (101.6 kg)  06/06/16 226 lb 9.6 oz (102.8 kg)  05/11/16 225 lb (102.1 kg)   BP Readings from Last 3 Encounters:  07/25/16 (!) 164/72  07/14/16 (!) 142/60  06/29/16 (!) 148/72   Pulse Readings from Last 3 Encounters:  07/25/16 64  07/14/16 65  06/29/16 74    Renal function: CrCl cannot be calculated (Unknown ideal weight.).  Past Medical History:  Diagnosis Date  . Constipation   . Diabetes mellitus type 2, noninsulin dependent ([place])       . Dyslipidemia    Hx with intolerance to statin therapy  . Hypertension   . Obesity   . Single vessel coronary disease    s/p stent LAD 2005    Current Outpatient Prescriptions on File Prior to Visit  Medication Sig Dispense Refill  . aspirin 81 MG tablet Take 81 mg by mouth daily.      [name] atorvastatin (LIPITOR) 40 MG tablet Take 1 tablet (40 mg total) by mouth daily. 90 tablet 0  . glucose blood (ONE TOUCH ULTRA TEST) test strip Use for blood sugar checks once daily. 100 each 11  .  metFORMIN (GLUCOPHAGE) 850 MG tablet Take 1 tablet (850 mg total) by mouth 3 (three) times daily with meals. 90 tablet 0  . metoprolol (LOPRESSOR) 50 MG tablet Take 1 tablets in the morning, 1 tablet at night 225 tablet 1  . Multiple Vitamin (MULTI-VITAMIN DAILY PO) Take 1 tablet by mouth 1 day or 1 dose.    . Omega-3 Fatty Acids (FISH OIL) 1200 MG CAPS Take 1 capsule by mouth daily.      Glory Rosebush DELICA LANCETS 99991111 MISC Use to check blood sugar once daily. 100 each 12  . saw palmetto 500 MG capsule Take 500 mg by mouth daily.    . nitroGLYCERIN (NITROSTAT) 0.4 MG SL tablet Place 1 tablet (0.4 mg total) under the tongue every 5 (five) minutes as needed for chest pain. (Patient not taking: Reported on 07/25/2016) 25 tablet 11   No current facility-administered medications on file prior to visit.     Allergies  Allergen Reactions  . Lisinopril Hives, Itching and Cough  .  Losartan Hives    Hives, itching, swelling of face and tongue associated w/ taking this medication.  [name] Hctz [Hydrochlorothiazide] Itching    Blood pressure (!) 164/72, pulse 64, SpO2 98 %.   Assessment/Plan: Hypertension: BP is not controlled. After brief discussion about need to keep pressures controlled with keeping side effects to a minimum, patient states he would prefer to wait to see what Dr. [name] thinks about blood pressures and medication changes. Will have him make appt to see Dr. Neita Garnet at check out today.    Thank you, Lelan Pons. Patterson Hammersmith, [place]

## 2016-07-25 NOTE — Patient Instructions (Addendum)
Return for a follow up appointment with Dr. [name] in January  Check your blood pressure at home daily (if able) and keep record of the readings.  Take your BP meds as follows: Continue Metoprolol 50mg  twice daily  Bring all of your meds, your BP cuff and your record of home blood pressures to your next appointment.  Exercise as you're able, try to walk approximately 30 minutes per day.  Keep salt intake to a minimum, especially watch canned and prepared boxed foods.  Eat more fresh fruits and vegetables and fewer canned items.  Avoid eating in fast food restaurants.    HOW TO TAKE YOUR BLOOD PRESSURE: . Rest 5 minutes before taking your blood pressure. .  Don't smoke or drink caffeinated beverages for at least 30 minutes before. . Take your blood pressure before (not after) you eat. . Sit comfortably with your back supported and both feet on the floor (don't cross your legs). . Elevate your arm to heart level on a table or a desk. . Use the proper sized cuff. It should fit smoothly and snugly around your bare upper arm. There should be enough room to slip a fingertip under the cuff. The bottom edge of the cuff should be 1 inch above the crease of the elbow. . Ideally, take 3 measurements at one sitting and record the average.

## 2016-07-29 ENCOUNTER — Encounter: Payer: Self-pay | Admitting: Pharmacist

## 2016-08-28 ENCOUNTER — Other Ambulatory Visit: Payer: Self-pay | Admitting: Family Medicine

## 2016-10-16 HISTORY — PX: FINGER SURGERY: SHX640

## 2016-10-28 NOTE — Progress Notes (Deleted)
Colin Newman Date of Birth: 02-Jul-1933   History of Present Illness: Mr. Andrzejewski is seen for yearly followup of CAD. He has a history of coronary disease and had stenting of the proximal LAD in 2005 with a bare-metal stent.   His last Myoview study in October 2013 was normal. He also has a history of HTN and HL. Was seen last April with asymptomatic second degree AV block Mobitz type 1. Metoprolol dose was reduced from 75 mg bid to 50 mg bid. Developed swelling and orthostasis on amlodipine. Rash and angioedema on losartan. Also intolerance to lisinopril, HCTZ, and hydralazine. Felt chlorthalidone was too expensive. Aldactone caused nocturia.  He reports he has done very well this year. No chest pain, SOB, palpitations, dizziness, fatigue, or edema. Walks or uses a stationary bike daily. He has gained about 5 lbs.   Current Outpatient Prescriptions on File Prior to Visit  Medication Sig Dispense Refill  . aspirin 81 MG tablet Take 81 mg by mouth daily.      [name] atorvastatin (LIPITOR) 40 MG tablet Take 1 tablet (40 mg total) by mouth daily. 90 tablet 0  . glucose blood (ONE TOUCH ULTRA TEST) test strip Use for blood sugar checks once daily. 100 each 11  . metFORMIN (GLUCOPHAGE) 850 MG tablet Take 1 tablet (850 mg total) by mouth 3 (three) times daily with meals. 90 tablet 0  . metoprolol (LOPRESSOR) 50 MG tablet Take 1 tablet (50 mg total) by mouth 2 (two) times daily. 225 tablet 0  . Multiple Vitamin (MULTI-VITAMIN DAILY PO) Take 1 tablet by mouth 1 day or 1 dose.    . nitroGLYCERIN (NITROSTAT) 0.4 MG SL tablet Place 1 tablet (0.4 mg total) under the tongue every 5 (five) minutes as needed for chest pain. (Patient not taking: Reported on 07/25/2016) 25 tablet 11  . Omega-3 Fatty Acids (FISH OIL) 1200 MG CAPS Take 1 capsule by mouth daily.      Glory Rosebush DELICA LANCETS [id number] MISC Use to check blood sugar once daily. 100 each 12  . saw palmetto 500 MG capsule Take 500 mg by mouth daily.     No  current facility-administered medications on file prior to visit.     Allergies  Allergen Reactions  . Lisinopril Hives, Itching and Cough  . Losartan Hives    Hives, itching, swelling of face and tongue associated w/ taking this medication.  [name] Hctz [Hydrochlorothiazide] Itching    Past Medical History:  Diagnosis Date  . Constipation   . Diabetes mellitus type 2, noninsulin dependent ([place])       . Dyslipidemia    Hx with intolerance to statin therapy  . Hypertension   . Obesity   . Single vessel coronary disease    s/p stent LAD 2005    Past Surgical History:  Procedure Laterality Date  . BACK SURGERY     2 previous  . BILATERAL KNEE ARTHROSCOPY    . CARDIOVASCULAR STRESS TEST  07-20-2009   EF 73%  . OTHER SURGICAL HISTORY     Stent in the proximal left anterior descending in 2005    History  Smoking Status  . Former Smoker  Smokeless Tobacco  . Never Used    History  Alcohol Use No    Comment: Rarely has a beer    Family History  Problem Relation Age of Onset  . Pulmonary embolism Mother   . Cancer Father     Review of Systems: As noted in  history of present illness. All other systems were reviewed and are negative.  Physical Exam: There were no vitals taken for this visit. He is an obese white male in no acute distress. The HEENT exam is normal. The carotids are 2+ without bruits.  There is no thyromegaly.  There is no JVD.  The lungs are clear.    The heart exam reveals a regular rate with a normal S1 and S2.  There are no murmurs, gallops, or rubs.  The PMI is not displaced.   Abdominal exam reveals good bowel sounds.   There is no hepatosplenomegaly or tenderness.  There are no masses.  Exam of the legs reveal no clubbing, cyanosis, or edema.    The distal pulses are intact.  Cranial nerves II - XII are intact.  Motor and sensory functions are intact.   LABORATORY DATA: Lab Results  Component Value Date   WBC 5.4 08/21/2012   HGB 15.1 08/21/2012    HCT 42.9 08/21/2012   PLT 175 08/21/2012   GLUCOSE 194 (H) 12/20/2015   CHOL 102 (L) 07/17/2016   TRIG 160 (H) 07/17/2016   HDL 34 (L) 07/17/2016   LDLDIRECT 66 11/04/2013   LDLCALC 36 07/17/2016   ALT 17 03/16/2015   AST 15 03/16/2015   NA 139 12/20/2015   K 4.6 12/20/2015   CL 100 12/20/2015   CREATININE 0.94 12/20/2015   BUN 17 12/20/2015   CO2 26 12/20/2015   HGBA1C 7.1 07/14/2016   MICROALBUR 3.6 07/14/2016    ECG today demonstrates normal sinus rhythm with first degree AV block. PR interval has progressively increased and is now 386 msec. Rate is 77beats per minute. It is otherwise normal. I have personally reviewed and interpreted this study.   Assessment / Plan: 1. Coronary disease status post stenting of the proximal LAD in 2005 with a bare-metal stent. Normal Myoview study in October 2013. He is asymptomatic. We will continue with his medical management. I'll followup again in one year.  2. Hypertension. Mildly elevated. I requested that he get a BP monitor and record at home.   3. Diabetes mellitus type 2.  4. Hyperlipidemia. Continue statin therapy.  5. Progressive PR prolongation. I am concerned about his risk of developing heart block. Will reduce metoprolol to 50 mg bid.

## 2016-10-30 NOTE — Progress Notes (Deleted)
   [facility] Phone: [phone]   Date of Visit: 11/01/2016   HPI:  DM2:  - A1c  7.1 (06/2016) < 7.7 (02/2016) < 7.8 (10/2015) < 7.7 (07/2015) < 9.5 (02/2015)  - Medications: Metformin 850mg  TID (changed from 1000mg  BID on 06/2016)  ROS: See HPI.  [place]:  PMH: CAD s/p stent 2005 Mobitz Type 1 Second Degree AV Block  HTN Colon Polyp DM2 Haring Loss Hemorrhoids Skin Cancer/Actinic Keratosis OA BPH Erectylel Dysfunction Obeisty Right Foot Drop   PHYSICAL EXAM: There were no vitals taken for this visit. Gen: *** HEENT: *** Heart: *** Lungs: *** Neuro: *** Ext: ***  ASSESSMENT/PLAN:  Health maintenance:  -***  No problem-specific Assessment & Plan notes found for this encounter.  FOLLOW UP: Follow up in *** for ***  [name], MD PGY [place]

## 2016-11-01 ENCOUNTER — Ambulatory Visit: Payer: Medicare Other | Admitting: Internal Medicine

## 2016-11-02 ENCOUNTER — Ambulatory Visit: Payer: Medicare Other | Admitting: Cardiology

## 2016-11-05 NOTE — Progress Notes (Signed)
   [facility] Phone: [phone]   Date of Visit: 11/06/2016   HPI:  DM2:  - A1c 6.8 (11/07/16) < 7.1 (07/14/2016)<  7.7 (02/2016) < 7.8 (10/2015) < 7.7 (07/2015) < 9.5 (02/2015)  - Medications: Metformin 1000mg  BID (is not taking 850mg  TID which was prescribed at last visit in September)  - checks cbgs once at different times during the day - CBGs: AM fasting: 140-150, reports "a little high this morning"  - watches carb intake  - reports he is pretty active; but reports decerased since thanksgiving; wants to start walking again - Optho: has an appointment next month  - at last visit, urine micro-albumin ratio slightly elevated to 38, falling in the category of microalbuminuria. Per chart review, patient had allergies to ACE and ARB in the past  - denies polyuria, polydipsia, or blurred vision. Denies pain or paresthesias in upper or lower extremities.   Health Maintenance: - declined PSA to screen for prostrate cancer  - of note, patient took himself off of anti-hypertensive medications Hydralazine and Spironolactone. Is still on Metoprolol.   ROS: See HPI.  [place]:  PMH:  Obesity  HTN- difficult to treat due to multiple med allergies  DM2  HLD CAD s/p stenting proximal LAD in 2005; Mobitz type 1 second degree AV block BPH, ED Hx of Colon Polyp  Hx of Skin Cancer  Hemorrhoids Osteoarthritis Right Foot Drop  PHYSICAL EXAM: BP (!) 155/72 (BP Location: Left Arm, Patient Position: Sitting, Cuff Size: Normal)   Pulse 74   Temp 97.6 F (36.4 C) (Oral)   Ht 5\' 11"  (1.803 m)   Wt 225 lb 3.2 oz (102.2 kg)   SpO2 98%   BMI 31.41 kg/m   GEN: NAD CV: RRR, no murmurs, rubs, or gallops PULM: CTAB, normal effort ABD: Soft, nontender, nondistended, NABS, no organomegaly SKIN: No rash or cyanosis; warm and well-perfused EXTR: No lower extremity edema or calf tenderness PSYCH: Mood and affect euthymic, normal rate and volume of speech NEURO: Awake, alert,  no focal deficits grossly, normal speech  ASSESSMENT/PLAN:  Health maintenance:  - provided Rx for Tdap  - Received PNA 23V vaccine today  - Provided Rx for Zostavax - Declined Flu vaccine today   Diabetes mellitus type 2, uncontrolled A1c at goal today. Patient would like to remain on Metformin 1000mg  BID instead of 850mg  TID. On Foot exam today, somewhat decreased sensation to monofilament test; advised to check feet daily. Initial urine microalbumin ratio elevated at prior visit; will obtain second prior to diagnosis of microalbuminuria. Additionally, patient is allergic to ACE and ARBs. Follow up in 3 months or sooner if he has concerns.    [name], MD PGY [place]

## 2016-11-06 ENCOUNTER — Encounter: Payer: Self-pay | Admitting: Internal Medicine

## 2016-11-06 ENCOUNTER — Ambulatory Visit (INDEPENDENT_AMBULATORY_CARE_PROVIDER_SITE_OTHER): Payer: Medicare Other | Admitting: Internal Medicine

## 2016-11-06 VITALS — BP 155/72 | HR 74 | Temp 97.6°F | Ht 71.0 in | Wt 225.2 lb

## 2016-11-06 DIAGNOSIS — E1165 Type 2 diabetes mellitus with hyperglycemia: Secondary | ICD-10-CM | POA: Diagnosis not present

## 2016-11-06 DIAGNOSIS — E785 Hyperlipidemia, unspecified: Secondary | ICD-10-CM

## 2016-11-06 DIAGNOSIS — I10 Essential (primary) hypertension: Secondary | ICD-10-CM

## 2016-11-06 DIAGNOSIS — E118 Type 2 diabetes mellitus with unspecified complications: Secondary | ICD-10-CM | POA: Diagnosis not present

## 2016-11-06 DIAGNOSIS — Z23 Encounter for immunization: Secondary | ICD-10-CM

## 2016-11-06 DIAGNOSIS — E1169 Type 2 diabetes mellitus with other specified complication: Secondary | ICD-10-CM | POA: Diagnosis present

## 2016-11-06 LAB — COMPLETE METABOLIC PANEL WITH GFR
ALBUMIN: 4.2 g/dL (ref 3.6–5.1)
ALT: 13 U/L (ref 9–46)
AST: 14 U/L (ref 10–35)
Alkaline Phosphatase: 80 U/L (ref 40–115)
BUN: 15 mg/dL (ref 7–25)
CALCIUM: 9.1 mg/dL (ref 8.6–10.3)
CHLORIDE: 103 mmol/L (ref 98–110)
CO2: 29 mmol/L (ref 20–31)
CREATININE: 0.94 mg/dL (ref 0.70–1.11)
GFR, Est African American: 88 mL/min (ref >=60)
GFR, Est Non African American: 76 mL/min (ref >=60)
GLUCOSE: 170 mg/dL — AB (ref 65–99)
Potassium: 4.1 mmol/L (ref 3.5–5.3)
SODIUM: 139 mmol/L (ref 135–146)
Total Bilirubin: 0.7 mg/dL (ref 0.2–1.2)
Total Protein: 6.8 g/dL (ref 6.1–8.1)

## 2016-11-06 LAB — POCT GLYCOSYLATED HEMOGLOBIN (HGB A1C): Hemoglobin A1C: 6.8

## 2016-11-06 MED ORDER — METFORMIN HCL 1000 MG PO TABS: 1000.0000 mg | ORAL_TABLET | Freq: Two times a day (BID) | ORAL | 0 refills | Status: DC

## 2016-11-06 MED ORDER — ZOSTER VACCINE LIVE 19400 UNT/0.65ML ~~LOC~~ SUSR: 0.6500 mL | Freq: Once | SUBCUTANEOUS | 0 refills | Status: AC

## 2016-11-06 MED ORDER — TETANUS-DIPHTH-ACELL PERTUSSIS 5-2-15.5 LF-MCG/0.5 IM SUSP: 0.5000 mL | Freq: Once | INTRAMUSCULAR | 0 refills | Status: AC

## 2016-11-06 NOTE — Patient Instructions (Signed)
Thank you for coming in  I sent Metformin to the pharmacy We will get labs today I provided you with prescription for tetanus and shingles.  Please follow up in 3 months or sooner if you have questions.    Diet Recommendations for Diabetes   Starchy (carb) foods include: Bread, rice, pasta, potatoes, corn, crackers, bagels, muffins, all baked goods.  (Fruits, milk, and yogurt also have carbohydrate, but most of these foods will not spike your blood sugar as the starchy foods will.)  A few fruits do cause high blood sugars; use small portions of bananas (limit to 1/2 at a time), grapes, and most tropical fruits.    Protein foods include: Meat, fish, poultry, eggs, dairy foods, and beans such as pinto and kidney beans (beans also provide carbohydrate).   1. Eat at least 3 meals and 1-2 snacks per day. Never go more than 4-5 hours while awake without eating.  2. Limit starchy foods to TWO per meal and ONE per snack. ONE portion of a starchy  food is equal to the following:   - ONE slice of bread (or its equivalent, such as half of a hamburger bun).   - 1/2 cup of a "scoopable" starchy food such as potatoes or rice.   - 15 grams of carbohydrate as shown on food label.  3. Both lunch and dinner should include a protein food, a carb food, and vegetables.   - Obtain twice as many veg's as protein or carbohydrate foods for both lunch and dinner.   - Fresh or frozen veg's are best.   - Try to keep frozen veg's on hand for a quick vegetable serving.    4. Breakfast should always include protein.

## 2016-11-07 LAB — MICROALBUMIN / CREATININE URINE RATIO
CREATININE, URINE: 110 mg/dL (ref 20–370)
MICROALB UR: 39.8 mg/dL
MICROALB/CREAT RATIO: 362 ug/mg{creat} — AB (ref <30)

## 2016-11-07 NOTE — Assessment & Plan Note (Addendum)
A1c at goal today. Patient would like to remain on Metformin 1000mg  BID instead of 850mg  TID. On Foot exam today, somewhat decreased sensation to monofilament test; advised to check feet daily. Initial urine microalbumin ratio elevated at prior visit; will obtain second prior to diagnosis of microalbuminuria. Additionally, patient is allergic to ACE and ARBs. Dicussed proper diabetic diet and importance of regular exercise.  Follow up in 3 months or sooner if he has concerns.

## 2016-11-10 ENCOUNTER — Telehealth: Payer: Self-pay | Admitting: Internal Medicine

## 2016-11-10 ENCOUNTER — Encounter: Payer: Self-pay | Admitting: Internal Medicine

## 2016-11-10 DIAGNOSIS — N289 Disorder of kidney and ureter, unspecified: Secondary | ICD-10-CM | POA: Insufficient documentation

## 2016-11-10 NOTE — Telephone Encounter (Signed)
Called patient to report of elevated urine microalbumin/creatinine ratio. Discussed the importance of good BP control. DM is controlled. Answered questions.

## 2016-11-15 ENCOUNTER — Other Ambulatory Visit: Payer: Self-pay | Admitting: Internal Medicine

## 2016-11-21 NOTE — Progress Notes (Signed)
Colin Newman Date of Birth: [birth year]   History of Present Illness: Mr. Podbielski is seen for yearly followup of CAD and HTN. He has a history of coronary disease and had stenting of the proximal LAD in 2005 with a bare-metal stent.   His last Myoview study in October 2013 was normal. He also has a history of marked first degree AV block. He has a history of HTN, DM, HLD,  1st degree AVB on ECG (progressing from 264>>386 ms from 2014>>2017). He was seen in our hypertensive clinic. He has been tried on multiple antihypertensives with multiple side effects.  Amlodipine - dizziness/edema Losartan - rash/angioedemia Lisinopril - itching HCTZ - itching Chlorthalidone - cost-prohibitive Spironolactone - caused him to wake 3-4 times per night to go to the bathroom  Hydralazine - caused him to be swimmy headed  He reports he is feeling well.  No chest pain, SOB, palpitations, dizziness, fatigue, or edema. He has not been exercising much this winter. His BP at home has been 140-150s.   Current Outpatient Prescriptions on File Prior to Visit  Medication Sig Dispense Refill  . aspirin 81 MG tablet Take 81 mg by mouth daily.      [name] atorvastatin (LIPITOR) 40 MG tablet TAKE ONE TABLET BY MOUTH ONCE DAILY 90 tablet 0  . glucose blood (ONE TOUCH ULTRA TEST) test strip Use for blood sugar checks once daily. 100 each 11  . metFORMIN (GLUCOPHAGE) 1000 MG tablet Take 1 tablet (1,000 mg total) by mouth 2 (two) times daily. 180 tablet 0  . metoprolol (LOPRESSOR) 50 MG tablet Take 1 tablet (50 mg total) by mouth 2 (two) times daily. 225 tablet 0  . Multiple Vitamin (MULTI-VITAMIN DAILY PO) Take 1 tablet by mouth 1 day or 1 dose.    . nitroGLYCERIN (NITROSTAT) 0.4 MG SL tablet Place 1 tablet (0.4 mg total) under the tongue every 5 (five) minutes as needed for chest pain. 25 tablet 11  . Omega-3 Fatty Acids (FISH OIL) 1200 MG CAPS Take 1 capsule by mouth daily.      Glory Rosebush DELICA LANCETS 99991111 MISC Use to  check blood sugar once daily. 100 each 12  . saw palmetto 500 MG capsule Take 500 mg by mouth daily.     No current facility-administered medications on file prior to visit.     Allergies  Allergen Reactions  . Lisinopril Hives, Itching and Cough  . Losartan Hives    Hives, itching, swelling of face and tongue associated w/ taking this medication.  [name] Hctz [Hydrochlorothiazide] Itching    Past Medical History:  Diagnosis Date  . Constipation   . Diabetes mellitus type 2, noninsulin dependent ([place])       . Dyslipidemia    Hx with intolerance to statin therapy  . Hypertension   . Obesity   . Single vessel coronary disease    s/p stent LAD 2005    Past Surgical History:  Procedure Laterality Date  . BACK SURGERY     2 previous  . BILATERAL KNEE ARTHROSCOPY    . CARDIOVASCULAR STRESS TEST  07-20-2009   EF 73%  . OTHER SURGICAL HISTORY     Stent in the proximal left anterior descending in 2005    History  Smoking Status  . Former Smoker  Smokeless Tobacco  . Never Used    History  Alcohol Use No    Comment: Rarely has a beer    Family History  Problem Relation Age of  Onset  . Pulmonary embolism Mother   . Cancer Father     Review of Systems: As noted in history of present illness. All other systems were reviewed and are negative.  Physical Exam: BP (!) 161/77   Pulse 72   Ht 5\' 11"  (1.803 m)   Wt 223 lb (101.2 kg)   BMI 31.10 kg/m  He is an overweight white male in no acute distress. The HEENT exam is normal. The carotids are 2+ without bruits.  There is no thyromegaly.  There is no JVD.  The lungs are clear.  The heart exam reveals a regular rate with a normal S1 and S2.  There are no murmurs, gallops, or rubs.  The PMI is not displaced.   Abdominal exam reveals good bowel sounds.   There is no hepatosplenomegaly or tenderness.  There are no masses.  Exam of the legs reveal no clubbing, cyanosis, or edema.    The distal pulses are intact.  Cranial nerves II  - XII are intact.  Motor and sensory functions are intact.   LABORATORY DATA:  Lab Results  Component Value Date   WBC 5.4 08/21/2012   HGB 15.1 08/21/2012   HCT 42.9 08/21/2012   PLT 175 08/21/2012   GLUCOSE 170 (H) 11/06/2016   CHOL 102 (L) 07/17/2016   TRIG 160 (H) 07/17/2016   HDL 34 (L) 07/17/2016   LDLDIRECT 66 11/04/2013   LDLCALC 36 07/17/2016   ALT 13 11/06/2016   AST 14 11/06/2016   NA 139 11/06/2016   K 4.1 11/06/2016   CL 103 11/06/2016   CREATININE 0.94 11/06/2016   BUN 15 11/06/2016   CO2 29 11/06/2016   HGBA1C 6.8 11/06/2016   MICROALBUR 39.8 11/06/2016    ECG not done today   Assessment / Plan: 1. Coronary disease status post stenting of the proximal LAD in 2005 with a bare-metal stent. Normal Myoview study in October 2013. He is asymptomatic. We will continue with his medical management. I'll followup again in 6 months.  2. Hypertension.  elevated. Intolerant to multiple medications as listed. I don't know of another agent to try at this point. Clonidine would not be a good option and non dihydroperidine calcium channel blockers would cause increase conduction delay. For now will continue current therapy. Focus more on lifestyle modification with aerobic exercise, weight loss, and DASH diet.   3. Diabetes mellitus type 2.  4. Hyperlipidemia. Continue statin therapy.  5. Progressive PR prolongation. Asymptomatic. Will continue current metoprolol dose for now.

## 2016-11-22 ENCOUNTER — Encounter: Payer: Self-pay | Admitting: Cardiology

## 2016-11-22 ENCOUNTER — Ambulatory Visit (INDEPENDENT_AMBULATORY_CARE_PROVIDER_SITE_OTHER): Payer: Medicare Other | Admitting: Cardiology

## 2016-11-22 VITALS — BP 161/77 | HR 72 | Ht 71.0 in | Wt 223.0 lb

## 2016-11-22 DIAGNOSIS — I441 Atrioventricular block, second degree: Secondary | ICD-10-CM

## 2016-11-22 DIAGNOSIS — I10 Essential (primary) hypertension: Secondary | ICD-10-CM

## 2016-11-22 DIAGNOSIS — I2510 Atherosclerotic heart disease of native coronary artery without angina pectoris: Secondary | ICD-10-CM

## 2016-11-22 DIAGNOSIS — E785 Hyperlipidemia, unspecified: Secondary | ICD-10-CM | POA: Diagnosis not present

## 2016-11-22 NOTE — Patient Instructions (Signed)
Continue your current therapy  Work on increasing your aerobic activity  Low sodium diet.  I will see you in 6 months.

## 2016-12-07 DIAGNOSIS — E113393 Type 2 diabetes mellitus with moderate nonproliferative diabetic retinopathy without macular edema, bilateral: Secondary | ICD-10-CM | POA: Diagnosis not present

## 2017-01-10 ENCOUNTER — Other Ambulatory Visit: Payer: Self-pay | Admitting: *Deleted

## 2017-01-10 MED ORDER — GLUCOSE BLOOD VI STRP: ORAL_STRIP | 11 refills | Status: DC

## 2017-02-13 ENCOUNTER — Other Ambulatory Visit: Payer: Self-pay | Admitting: Internal Medicine

## 2017-02-14 ENCOUNTER — Encounter (HOSPITAL_COMMUNITY): Payer: Self-pay | Admitting: *Deleted

## 2017-02-14 ENCOUNTER — Emergency Department (HOSPITAL_COMMUNITY): Payer: Medicare Other

## 2017-02-14 ENCOUNTER — Emergency Department (HOSPITAL_COMMUNITY)
Admission: EM | Admit: 2017-02-14 | Discharge: 2017-02-14 | Disposition: A | Discharge status: Home / Self Care | Payer: Medicare Other | Attending: Physician Assistant | Admitting: Physician Assistant

## 2017-02-14 DIAGNOSIS — Z87891 Personal history of nicotine dependence: Secondary | ICD-10-CM | POA: Insufficient documentation

## 2017-02-14 DIAGNOSIS — Y999 Unspecified external cause status: Secondary | ICD-10-CM | POA: Diagnosis not present

## 2017-02-14 DIAGNOSIS — IMO0001 Reserved for inherently not codable concepts without codable children: Secondary | ICD-10-CM

## 2017-02-14 DIAGNOSIS — S6991XA Unspecified injury of right wrist, hand and finger(s), initial encounter: Secondary | ICD-10-CM | POA: Diagnosis present

## 2017-02-14 DIAGNOSIS — Z7984 Long term (current) use of oral hypoglycemic drugs: Secondary | ICD-10-CM | POA: Diagnosis not present

## 2017-02-14 DIAGNOSIS — Z85828 Personal history of other malignant neoplasm of skin: Secondary | ICD-10-CM | POA: Diagnosis not present

## 2017-02-14 DIAGNOSIS — I10 Essential (primary) hypertension: Secondary | ICD-10-CM | POA: Diagnosis not present

## 2017-02-14 DIAGNOSIS — E119 Type 2 diabetes mellitus without complications: Secondary | ICD-10-CM | POA: Diagnosis not present

## 2017-02-14 DIAGNOSIS — Z7982 Long term (current) use of aspirin: Secondary | ICD-10-CM | POA: Diagnosis not present

## 2017-02-14 DIAGNOSIS — S61216A Laceration without foreign body of right little finger without damage to nail, initial encounter: Secondary | ICD-10-CM | POA: Insufficient documentation

## 2017-02-14 DIAGNOSIS — Z79899 Other long term (current) drug therapy: Secondary | ICD-10-CM | POA: Insufficient documentation

## 2017-02-14 DIAGNOSIS — Y929 Unspecified place or not applicable: Secondary | ICD-10-CM | POA: Insufficient documentation

## 2017-02-14 DIAGNOSIS — S63286A Dislocation of proximal interphalangeal joint of right little finger, initial encounter: Secondary | ICD-10-CM | POA: Insufficient documentation

## 2017-02-14 DIAGNOSIS — Y939 Activity, unspecified: Secondary | ICD-10-CM | POA: Diagnosis not present

## 2017-02-14 DIAGNOSIS — W01198A Fall on same level from slipping, tripping and stumbling with subsequent striking against other object, initial encounter: Secondary | ICD-10-CM | POA: Diagnosis not present

## 2017-02-14 DIAGNOSIS — I2510 Atherosclerotic heart disease of native coronary artery without angina pectoris: Secondary | ICD-10-CM | POA: Diagnosis not present

## 2017-02-14 MED ORDER — LIDOCAINE HCL (PF) 1 % IJ SOLN
30.0000 mL | Freq: Once | INTRAMUSCULAR | Status: DC
  Filled 2017-02-14: qty 30

## 2017-02-14 MED ORDER — LIDOCAINE HCL (PF) 1 % IJ SOLN
INTRAMUSCULAR | Status: AC
  Filled 2017-02-14: qty 5

## 2017-02-14 MED ORDER — LIDOCAINE HCL (CARDIAC) 20 MG/ML IV SOLN
INTRAVENOUS | Status: AC
  Filled 2017-02-14: qty 5

## 2017-02-14 MED ORDER — CEPHALEXIN 500 MG PO CAPS: 500.0000 mg | ORAL_CAPSULE | Freq: Four times a day (QID) | ORAL | 0 refills | Status: DC

## 2017-02-14 NOTE — ED Triage Notes (Signed)
Pt reports tripping and falling today, has obv open fracture to right little finger. Minimal bleeding noted at triage.

## 2017-02-14 NOTE — ED Provider Notes (Addendum)
[facility] DEPT Provider Note   CSN: [id number] Arrival date & time: 02/14/17  1626  By signing my name below, I, Colin Newman, attest that this documentation has been prepared under the direction and in the presence of Colin Avallone Julio Alm, MD. Electronically Signed: [name], Scribe. 02/14/2017. 7:53 PM.   History   Chief Complaint Chief Complaint  Patient presents with  . Fall   The history is provided by the patient and medical records. No language interpreter was used.   HPI Comments:  Colin Newman is a 83 y.o. male with a PMHx of DM-2, Dyslipidemia, and prior Stent for Single Vessel Coronary Disease, who presents to the Emergency Department complaining of sudden onset fracture to the right fifth digit s/p a mechanical fall this afternoon ~3:45PM. Pt reports tripping over his feet, falling and striking his right hand on the ground. Denies LOC. Per his son, the pt always drags his feet when he ambulates. There is a laceration to the affected finger that is 7/10 painful with bone visible. Pt denies any other injuries at this time.  Past Medical History:  Diagnosis Date  . Constipation   . Diabetes mellitus type 2, noninsulin dependent ([place])       . Dyslipidemia    Hx with intolerance to statin therapy  . Hypertension   . Obesity   . Single vessel coronary disease    s/p stent LAD 2005   Patient Active Problem List   Diagnosis Date Noted  . Nephropathy 11/10/2016  . Mobitz type 1 second degree atrioventricular block 02/07/2016  . Pre-ulcerative calluses 03/16/2015  . Right foot drop 03/16/2015  . CAD (coronary artery disease), native coronary artery 09/17/2014  . Eustachian tube dysfunction 10/28/2013  . Hearing loss 10/28/2013  . Obesity (BMI 30.0-34.9) 08/30/2012  . Chest wall pain 08/21/2012  . Drug rash 11/21/2011  . Dyslipidemia 07/26/2011  . Diabetes mellitus type 2, uncontrolled ([place]) 06/23/2011  . ACTINIC KERATOSIS 06/11/2008  . ERECTILE  DYSFUNCTION 05/21/2008  . CORONARY ARTERY DISEASE, S/P PTCA 02/06/2008  . SKIN CANCER 12/13/2006  . COLON POLYP 12/13/2006  . Essential hypertension 12/13/2006  . HEMORRHOIDS, NOS 12/13/2006  . BPH 12/13/2006  . OSTEOARTHRITIS, LOWER LEG 12/13/2006  . BACK PAIN, LOW 12/13/2006   Past Surgical History:  Procedure Laterality Date  . BACK SURGERY     2 previous  . BILATERAL KNEE ARTHROSCOPY    . CARDIOVASCULAR STRESS TEST  07-20-2009   EF 73%  . OTHER SURGICAL HISTORY     Stent in the proximal left anterior descending in 2005    Home Medications    Prior to Admission medications   Medication Sig Start Date End Date Taking? Authorizing Provider  aspirin 81 MG tablet Take 81 mg by mouth daily.      Historical Provider, MD  atorvastatin (LIPITOR) 40 MG tablet TAKE ONE TABLET BY MOUTH ONCE DAILY 02/14/17   [name], MD  glucose blood (ONE TOUCH ULTRA TEST) test strip Use for blood sugar checks once daily. 01/10/17   [name], MD  metFORMIN (GLUCOPHAGE) 1000 MG tablet Take 1 tablet (1,000 mg total) by mouth 2 (two) times daily. 11/06/16   [name], MD  metoprolol (LOPRESSOR) 50 MG tablet Take 1 tablet (50 mg total) by mouth 2 (two) times daily. 08/29/16   [name], MD  Multiple Vitamin (MULTI-VITAMIN DAILY PO) Take 1 tablet by mouth 1 day or 1 dose.    Historical Provider, MD  nitroGLYCERIN (NITROSTAT) 0.4 MG SL tablet Place 1 tablet (0.4 mg total) under the tongue every 5 (five) minutes as needed for chest pain. 02/07/16   [name], PA-C  Omega-3 Fatty Acids (FISH OIL) 1200 MG CAPS Take 1 capsule by mouth daily.      Historical Provider, MD  [facility] DELICA LANCETS 80X MISC Use to check blood sugar once daily. 07/13/16   [name], MD  saw palmetto 500 MG capsule Take 500 mg by mouth daily.    Historical Provider, MD   Family History Family History  Problem Relation Age of Onset  . Pulmonary embolism Mother   . Cancer Father      Social History Social History  Substance Use Topics  . Smoking status: Former Research scientist (life sciences)  . Smokeless tobacco: Never Used  . Alcohol use No     Comment: Rarely has a beer   Allergies   Lisinopril; Losartan; and Hctz [hydrochlorothiazide]   Review of Systems Review of Systems  Musculoskeletal: Positive for arthralgias.  Skin: Positive for wound.  Neurological: Negative for syncope.     Physical Exam Updated Vital Signs BP (!) 134/93   Pulse 62   Temp 97.9 F (36.6 C) (Oral)   Resp 16   SpO2 100%   Physical Exam  Constitutional: He is oriented to person, place, and time. He appears well-developed and well-nourished.  HENT:  Head: Normocephalic.  Eyes: Conjunctivae are normal.  Cardiovascular: Normal rate.   Pulmonary/Chest: Effort normal.  Abdominal: He exhibits no distension.  Musculoskeletal: Normal range of motion.  Neurological: He is alert and oriented to person, place, and time.  Skin: Skin is warm and dry.  Laceration to the PIP, fat evident and clear dislocation.   Psychiatric: He has a normal mood and affect.  Nursing note and vitals reviewed.    ED Treatments / Results  DIAGNOSTIC STUDIES:  Oxygen Saturation is 98% on RA, normal by my interpretation.    COORDINATION OF CARE:  7:51 PM Discussed treatment plan with pt at bedside including XR of the right hand and consult to the Hand Surgeon and pt agreed to plan.  8:00 PM Consult placed to [employer] (all labs ordered are listed, but only abnormal results are displayed) Labs Reviewed - No data to display  EKG  EKG Interpretation None       Radiology Dg Hand Complete Right  Result Date: 02/14/2017 CLINICAL DATA:  Injury, bleeding EXAM: RIGHT HAND - COMPLETE 3+ VIEW COMPARISON:  None. FINDINGS: Dorsal dislocation at the fifth PIP joint with possible tiny chip fracture at the dorsal base of the fifth middle phalanx. No radiopaque foreign body. Joint space narrowing and  osteophyte at the DIP and PIP joints. Vascular calcifications. IMPRESSION: 1. Dorsal dislocation at the PIP joint with possible small chip fracture at the base of the fifth middle phalanx. Electronically Signed   By: Donavan Foil M.D.   On: 02/14/2017 18:04    Procedures Procedures (including critical care time)  Medications Ordered in ED Medications - No data to display   Initial Impression / Assessment and Plan / ED Course  I have reviewed the triage vital signs and the nursing notes.  Pertinent labs & imaging results that were available during my care of the patient were reviewed by me and considered in my medical decision making (see chart for details).     I personally performed the services described in this documentation, which was scribed in my presence. The recorded  information has been reviewed and is accurate.   Patient is a 83 year old male presenting with fall. Patient sustained injury to the right fifth digit. Patient has laceration overlying the PIP with obvious fat extrusion. Deformity of the finger. X-ray shows dislocation and small fracture of the middle phalynx.  8:12 PM Page Dr. Apolonio Schneiders but the wrong person was paged. Paged again.   9:21 PM Discussed with Caralyn Guile who recommended washout splinting.  After I did digital block patient noted to have exposed tendon. Good ROM once reduced. Reduction does show unstable joint. Discussed with Dr. Caralyn Guile again and he recommends continuing with plan to tack the skin, splint, follow up as an outpatient.  Reduction of dislocation Date/Time: 9:23 PM Performed by: Gardiner Sleeper Authorized by: Gardiner Sleeper Consent: Verbal consent obtained. Risks and benefits: risks, benefits and alternatives were discussed Consent given by: patient Required items: required blood products, implants, devices, and special equipment available Time out: Immediately prior to procedure a "time out" was called to verify the correct  patient, procedure, equipment, support staff and site/side marked as required.  Patient sedated: Not  Vitals: Vital signs were monitored during sedation. Patient tolerance: Patient tolerated the procedure well with no immediate complications. Joint: PIP Reduction technique: traction  LACERATION REPAIR Performed by: Gardiner Sleeper Authorized by: Gardiner Sleeper Consent: Verbal consent obtained. Risks and benefits: risks, benefits and alternatives were discussed Consent given by: patient Patient identity confirmed: provided demographic data Prepped and Draped in normal sterile fashion Wound explored  Laceration Location: finger Laceration Length: 4cm  No Foreign Bodies seen or palpated  Anesthesia: local infiltration  Local anesthetic: lidocaine 1 w/epinephrine digital block   Anesthetic total: 8 ml  Irrigation method: syringe and soaking.  Amount of cleaning: standard  Skin closure: 6 4-0 chromic interrupted.  Patient tolerance: Patient tolerated the procedure well with no immediate complications.    Final Clinical Impressions(s) / ED Diagnoses   Final diagnoses:  None    New Prescriptions New Prescriptions   No medications on file     Kattaleya Alia Julio Alm, MD 02/14/17 2124    Macarthur Critchley, MD 02/18/17 [phone]

## 2017-02-14 NOTE — Discharge Instructions (Signed)
You were seen here today with a fracture and dislocation. We discussed with the hand surgeon. We have temporarily reduced the finger to the right place. We will need to use the antibiotics. Please return with any signs of infection.

## 2017-02-15 DIAGNOSIS — S61206A Unspecified open wound of right little finger without damage to nail, initial encounter: Secondary | ICD-10-CM | POA: Diagnosis not present

## 2017-02-15 DIAGNOSIS — S63276A Dislocation of unspecified interphalangeal joint of right little finger, initial encounter: Secondary | ICD-10-CM | POA: Diagnosis not present

## 2017-02-27 DIAGNOSIS — S63276D Dislocation of unspecified interphalangeal joint of right little finger, subsequent encounter: Secondary | ICD-10-CM | POA: Diagnosis not present

## 2017-02-28 DIAGNOSIS — S63276A Dislocation of unspecified interphalangeal joint of right little finger, initial encounter: Secondary | ICD-10-CM | POA: Diagnosis not present

## 2017-02-28 DIAGNOSIS — S63436A Traumatic rupture of volar plate of right little finger at metacarpophalangeal and interphalangeal joint, initial encounter: Secondary | ICD-10-CM | POA: Diagnosis not present

## 2017-02-28 DIAGNOSIS — S63296A Dislocation of distal interphalangeal joint of right little finger, initial encounter: Secondary | ICD-10-CM | POA: Diagnosis not present

## 2017-02-28 DIAGNOSIS — Y999 Unspecified external cause status: Secondary | ICD-10-CM | POA: Diagnosis not present

## 2017-03-06 DIAGNOSIS — Z4789 Encounter for other orthopedic aftercare: Secondary | ICD-10-CM | POA: Diagnosis not present

## 2017-03-06 DIAGNOSIS — S63276D Dislocation of unspecified interphalangeal joint of right little finger, subsequent encounter: Secondary | ICD-10-CM | POA: Diagnosis not present

## 2017-03-08 DIAGNOSIS — Z1211 Encounter for screening for malignant neoplasm of colon: Secondary | ICD-10-CM | POA: Diagnosis not present

## 2017-03-08 DIAGNOSIS — E669 Obesity, unspecified: Secondary | ICD-10-CM | POA: Diagnosis not present

## 2017-03-08 DIAGNOSIS — Z86010 Personal history of colonic polyps: Secondary | ICD-10-CM | POA: Diagnosis not present

## 2017-03-08 DIAGNOSIS — D649 Anemia, unspecified: Secondary | ICD-10-CM | POA: Diagnosis not present

## 2017-03-08 DIAGNOSIS — K5904 Chronic idiopathic constipation: Secondary | ICD-10-CM | POA: Diagnosis not present

## 2017-03-13 DIAGNOSIS — Z4789 Encounter for other orthopedic aftercare: Secondary | ICD-10-CM | POA: Diagnosis not present

## 2017-03-13 DIAGNOSIS — S63276D Dislocation of unspecified interphalangeal joint of right little finger, subsequent encounter: Secondary | ICD-10-CM | POA: Diagnosis not present

## 2017-03-15 DIAGNOSIS — S63276D Dislocation of unspecified interphalangeal joint of right little finger, subsequent encounter: Secondary | ICD-10-CM | POA: Diagnosis not present

## 2017-03-20 DIAGNOSIS — Z4789 Encounter for other orthopedic aftercare: Secondary | ICD-10-CM | POA: Diagnosis not present

## 2017-03-20 DIAGNOSIS — S63276D Dislocation of unspecified interphalangeal joint of right little finger, subsequent encounter: Secondary | ICD-10-CM | POA: Diagnosis not present

## 2017-03-22 DIAGNOSIS — S63276D Dislocation of unspecified interphalangeal joint of right little finger, subsequent encounter: Secondary | ICD-10-CM | POA: Diagnosis not present

## 2017-03-27 DIAGNOSIS — S63276D Dislocation of unspecified interphalangeal joint of right little finger, subsequent encounter: Secondary | ICD-10-CM | POA: Diagnosis not present

## 2017-03-29 DIAGNOSIS — S63276D Dislocation of unspecified interphalangeal joint of right little finger, subsequent encounter: Secondary | ICD-10-CM | POA: Diagnosis not present

## 2017-03-30 ENCOUNTER — Encounter: Payer: Self-pay | Admitting: Internal Medicine

## 2017-03-30 ENCOUNTER — Telehealth: Payer: Self-pay

## 2017-03-30 ENCOUNTER — Ambulatory Visit (INDEPENDENT_AMBULATORY_CARE_PROVIDER_SITE_OTHER): Payer: Medicare Other | Admitting: Internal Medicine

## 2017-03-30 VITALS — BP 162/70 | HR 92 | Temp 98.2°F | Wt 223.0 lb

## 2017-03-30 DIAGNOSIS — E1165 Type 2 diabetes mellitus with hyperglycemia: Secondary | ICD-10-CM | POA: Diagnosis not present

## 2017-03-30 DIAGNOSIS — E785 Hyperlipidemia, unspecified: Secondary | ICD-10-CM

## 2017-03-30 DIAGNOSIS — I2510 Atherosclerotic heart disease of native coronary artery without angina pectoris: Secondary | ICD-10-CM | POA: Diagnosis not present

## 2017-03-30 DIAGNOSIS — I10 Essential (primary) hypertension: Secondary | ICD-10-CM

## 2017-03-30 DIAGNOSIS — E1169 Type 2 diabetes mellitus with other specified complication: Secondary | ICD-10-CM

## 2017-03-30 LAB — POCT GLYCOSYLATED HEMOGLOBIN (HGB A1C): Hemoglobin A1C: 7

## 2017-03-30 MED ORDER — METOPROLOL TARTRATE 50 MG PO TABS: 50.0000 mg | ORAL_TABLET | Freq: Two times a day (BID) | ORAL | 0 refills | Status: DC

## 2017-03-30 MED ORDER — SPIRONOLACTONE 25 MG PO TABS: 25.0000 mg | ORAL_TABLET | Freq: Every day | ORAL | 0 refills | Status: DC

## 2017-03-30 NOTE — Patient Instructions (Signed)
Follow up in 1 week for HTN.  We will get blood work

## 2017-03-30 NOTE — Assessment & Plan Note (Signed)
Continue Atorvastatin. CMP today to check LFTs.

## 2017-03-30 NOTE — Assessment & Plan Note (Signed)
A1c 7 today. Continue current dose of Metformin 1000mg  BID. Encouraged to still improve diet and increase physical exercise.

## 2017-03-30 NOTE — Telephone Encounter (Signed)
Received clearance from Dr.Mann's office.Dr.Jordan cleared patient for up coming colonoscopy.Form faxed back to fax # [phone].

## 2017-03-30 NOTE — Progress Notes (Signed)
   [facility] Phone: [phone]   Date of Visit: 03/30/2017   HPI:  DM2:  - reports checking cbgs once day but does miss some days - reports of cbgs 150-160s recently as he has not been adhering to a diabetic diet  - he also has cut back on physical activity due to a recent injury but is looking forward to restarting  - he is currently taking Metformin 1000mg  BID without any issues  - denies polyuria or polydipsia  - reports he was seen by the eye doctor in April 2018 and had a diabetic eye exam done. This was in [place], but he does not recall the name of the clinic.    HTN:  - current medication is Metoprolol 50mg  BID  - denies any chest pain, HA, blurred vision  - has multiple allergies to various medications for HTN  - his blood pressure has been elevated at prior visits recently but has been hesitant to restart another medication in the past. We discussed options and patient chose Spironolactone.   HLD:  - Takes Lipitor as prescribed - no RUQ pain or muscle aches - last lipid panel was on 07/2016  ROS: See HPI.  [place]:  PMH:  Obesity  HTN- difficult to treat due to multiple med allergies  DM2  HLD CAD s/p stenting proximal LAD in 2005; Mobitz type 1 second degree AV block BPH, ED  Hx of Colon Polyp  Hx of Skin Cancer  Hemorrhoids Osteoarthritis Right Foot Drop  PHYSICAL EXAM: BP (!) 162/70   Pulse 92   Temp 98.2 F (36.8 C) (Oral)   Wt 223 lb (101.2 kg)   SpO2 98%   BMI 31.10 kg/m  GEN: NAD CV: RRR, no murmurs, rubs, or gallops PULM: CTAB, normal effort SKIN: No rash or cyanosis; warm and well-perfused EXTR: No lower extremity edema or calf tenderness PSYCH: Mood and affect euthymic, normal rate and volume of speech NEURO: Awake, alert  Diabetic Foot Exam - Simple   Simple Foot Form Visual Inspection No deformities, no ulcerations, no other skin breakdown bilaterally:  Yes Sensation Testing See comments:  Yes Pulse  Check Posterior Tibialis and Dorsalis pulse intact bilaterally:  Yes Comments Decreased sensation with monofilament testing bilaterally throughout the dorsal and planter aspect of bilateral feet.      ASSESSMENT/PLAN:  No problem-specific Assessment & Plan notes found for this encounter.   [name], MD PGY [place]

## 2017-03-30 NOTE — Assessment & Plan Note (Signed)
Uncontrolled. his blood pressure has been elevated at prior visits recently but has been hesitant to restart another medication in the past. We discussed options and patient chose Spironolactone. Spironolactone 25mg  daily. CMP today. Follow up in 1 week

## 2017-03-31 LAB — COMPREHENSIVE METABOLIC PANEL
ALBUMIN: 4.1 g/dL (ref 3.5–4.7)
ALK PHOS: 96 IU/L (ref 39–117)
ALT: 15 IU/L (ref 0–44)
AST: 13 IU/L (ref 0–40)
Albumin/Globulin Ratio: 1.6 (ref 1.2–2.2)
BUN/Creatinine Ratio: 16 (ref 10–24)
BUN: 15 mg/dL (ref 8–27)
Bilirubin Total: 0.5 mg/dL (ref 0.0–1.2)
CHLORIDE: 100 mmol/L (ref 96–106)
CO2: 21 mmol/L (ref 20–29)
Calcium: 8.7 mg/dL (ref 8.6–10.2)
Creatinine, Ser: 0.92 mg/dL (ref 0.76–1.27)
GFR calc Af Amer: 90 mL/min/{1.73_m2} (ref >59)
GFR calc non Af Amer: 78 mL/min/{1.73_m2} (ref >59)
GLUCOSE: 340 mg/dL — AB (ref 65–99)
Globulin, Total: 2.6 g/dL (ref 1.5–4.5)
Potassium: 4.6 mmol/L (ref 3.5–5.2)
SODIUM: 139 mmol/L (ref 134–144)
TOTAL PROTEIN: 6.7 g/dL (ref 6.0–8.5)

## 2017-04-03 DIAGNOSIS — S63276D Dislocation of unspecified interphalangeal joint of right little finger, subsequent encounter: Secondary | ICD-10-CM | POA: Diagnosis not present

## 2017-04-06 ENCOUNTER — Ambulatory Visit (INDEPENDENT_AMBULATORY_CARE_PROVIDER_SITE_OTHER): Payer: Medicare Other | Admitting: Student

## 2017-04-06 ENCOUNTER — Encounter: Payer: Self-pay | Admitting: Student

## 2017-04-06 VITALS — BP 158/64 | HR 73 | Temp 98.9°F | Ht 71.0 in | Wt 219.4 lb

## 2017-04-06 DIAGNOSIS — E875 Hyperkalemia: Secondary | ICD-10-CM

## 2017-04-06 DIAGNOSIS — I10 Essential (primary) hypertension: Secondary | ICD-10-CM | POA: Diagnosis not present

## 2017-04-06 DIAGNOSIS — I2510 Atherosclerotic heart disease of native coronary artery without angina pectoris: Secondary | ICD-10-CM

## 2017-04-06 NOTE — Progress Notes (Signed)
   Subjective:    Patient ID: Colin Newman, male    DOB: [birth year], 83 y.o.   MRN: [id number]   CC: Follow up Hypertension  HPI: 83 y/o M presents for follow up hypertension    Hypertension - seen on 6/15 and was found to have persistently elevated HTN at which time he was started on spironolactone in addition to metoprolol - he reports starting spiro 5 days ago and has been compliant - he checks is BP and states it has usually been in the 695Q systolic with the highest after coming home from rehab for his finger. The highest he has had was 170/80 after rehab. At rehab he does exercises for his finger which are very painful for him. He is followed by ortho for this - he reports this is very stressful for him - no chest pain, SOB. Headache   Smoking status reviewed  Review of Systems  Per HPI, else denies recent illness, fever   Objective:  BP (!) 158/64   Pulse 73   Temp 98.9 F (37.2 C) (Oral)   Ht 5\' 11"  (1.803 m)   Wt 219 lb 6.4 oz (99.5 kg)   SpO2 94%   BMI 30.60 kg/m  Vitals and nursing note reviewed  General: NAD Cardiac: RRR,  Respiratory: CTAB, normal effort Extremities: Right fifth finger with mild swelling, else no edema or cyanosis. WWP. Skin: warm and dry, no rashes noted Neuro: alert and oriented, no focal deficits   Assessment & Plan:    Essential hypertension Blood pressure mildly elevated today in to 158/64 on repeat. Initially it was 160/64. Given his age and that he has no symptoms, I would not wish to drop him much further, especially given he continues have pain and stress due to recent finger injury reqeuiring surgery and physical therapy - will continue current regimen of spiro and metoprolol for now - BMP today to recheck K - follow in 1 month with PCP or earlier if BPs are consistently elevated at home    Alyssa A. Lincoln Brigham MD, [facility] Resident PGY-3 Pager [phone]

## 2017-04-06 NOTE — Patient Instructions (Signed)
Follow up in 1 month with Dr Dallas Schimke for HTN Call the office with any questions or concerns If your blood pressure begins to be elevated consistently in the 170s or above, call the office to be seen sooner You will have blood testing today and you will be called if it is abnorma;

## 2017-04-06 NOTE — Assessment & Plan Note (Signed)
Blood pressure mildly elevated today in to 158/64 on repeat. Initially it was 160/64. Given his age and that he has no symptoms, I would not wish to drop him much further, especially given he continues have pain and stress due to recent finger injury reqeuiring surgery and physical therapy - will continue current regimen of spiro and metoprolol for now - BMP today to recheck K - follow in 1 month with PCP or earlier if BPs are consistently elevated at home

## 2017-04-07 LAB — BASIC METABOLIC PANEL
BUN / CREAT RATIO: 19 (ref 10–24)
BUN: 20 mg/dL (ref 8–27)
CALCIUM: 9.2 mg/dL (ref 8.6–10.2)
CHLORIDE: 102 mmol/L (ref 96–106)
CO2: 21 mmol/L (ref 20–29)
Creatinine, Ser: 1.08 mg/dL (ref 0.76–1.27)
GFR calc Af Amer: 74 mL/min/{1.73_m2} (ref >59)
GFR calc non Af Amer: 64 mL/min/{1.73_m2} (ref >59)
Glucose: 165 mg/dL — ABNORMAL HIGH (ref 65–99)
POTASSIUM: 4.4 mmol/L (ref 3.5–5.2)
Sodium: 141 mmol/L (ref 134–144)

## 2017-04-09 ENCOUNTER — Encounter: Payer: Self-pay | Admitting: Student

## 2017-04-10 DIAGNOSIS — S63276D Dislocation of unspecified interphalangeal joint of right little finger, subsequent encounter: Secondary | ICD-10-CM | POA: Diagnosis not present

## 2017-04-11 DIAGNOSIS — D122 Benign neoplasm of ascending colon: Secondary | ICD-10-CM | POA: Diagnosis not present

## 2017-04-11 DIAGNOSIS — D125 Benign neoplasm of sigmoid colon: Secondary | ICD-10-CM | POA: Diagnosis not present

## 2017-04-11 DIAGNOSIS — Z1211 Encounter for screening for malignant neoplasm of colon: Secondary | ICD-10-CM | POA: Diagnosis not present

## 2017-04-11 DIAGNOSIS — K635 Polyp of colon: Secondary | ICD-10-CM | POA: Diagnosis not present

## 2017-04-11 DIAGNOSIS — D123 Benign neoplasm of transverse colon: Secondary | ICD-10-CM | POA: Diagnosis not present

## 2017-04-12 ENCOUNTER — Encounter: Payer: Self-pay | Admitting: Internal Medicine

## 2017-04-12 DIAGNOSIS — K635 Polyp of colon: Secondary | ICD-10-CM | POA: Insufficient documentation

## 2017-04-13 DIAGNOSIS — S63276D Dislocation of unspecified interphalangeal joint of right little finger, subsequent encounter: Secondary | ICD-10-CM | POA: Diagnosis not present

## 2017-04-17 DIAGNOSIS — S63276D Dislocation of unspecified interphalangeal joint of right little finger, subsequent encounter: Secondary | ICD-10-CM | POA: Diagnosis not present

## 2017-04-19 DIAGNOSIS — S63276D Dislocation of unspecified interphalangeal joint of right little finger, subsequent encounter: Secondary | ICD-10-CM | POA: Diagnosis not present

## 2017-04-20 ENCOUNTER — Encounter: Payer: Self-pay | Admitting: Internal Medicine

## 2017-04-26 DIAGNOSIS — S63276D Dislocation of unspecified interphalangeal joint of right little finger, subsequent encounter: Secondary | ICD-10-CM | POA: Diagnosis not present

## 2017-05-03 DIAGNOSIS — S63276D Dislocation of unspecified interphalangeal joint of right little finger, subsequent encounter: Secondary | ICD-10-CM | POA: Diagnosis not present

## 2017-05-09 DIAGNOSIS — Z86010 Personal history of colonic polyps: Secondary | ICD-10-CM | POA: Insufficient documentation

## 2017-05-09 NOTE — Progress Notes (Signed)
   [facility] Phone: [phone]   Date of Visit: 05/10/2017   HPI:  Hypertension:  -  He is not exercising and is adherent to low salt diet.   - Blood pressure is not well controlled at home. Reports it is in the 160s for SBP -  denies chest pain, palpitations, shortness of breath, orthopnea, HA, blurred vision.  - Cardiovascular risk factors: advanced age (older than 25 for men, 60 for women), diabetes mellitus, dyslipidemia, hypertension, male gender, obesity (BMI >= 30 kg/m2) and known CAD.  - Medications: Spironolactone 25mg  daily, metoprolol 50mg  BID  - is compliant with medications   Hyperlipidemia: - medication: Lipitor 40mg  daily - denies RUQ pain or myalgias - compliant with medication  - last lipid 07/2016 with LDL at goal   DM2:  Medications: Metformin 1000mg  BID  - compliant with medication  - has not checked his sugars in a while because he has been stressed out as he needs another surgery on his finger  - denies polyuria/polydipsia - A1c:  7.0 (03/30/17) < 6.8 (10/2016) < 7.1 (06/2016)   Eye exam current (within one year): yes; will see if we can get records  Weight trend: stable Current diet: in general, a "healthy" diet   Current exercise: none  Is He on ACE inhibitor or angiotensin II receptor blocker?  Does not tolearate    ROS: See HPI.  [place]:  PMH:  Obesity  HTN- difficult to treat due to multiple med allergies  DM2  HLD CAD s/p stenting proximal LAD in 2005; Mobitz type 1 second degree AV block BPH, ED  Hx of Colon Polyp  Hx of Skin Cancer  Hemorrhoids Osteoarthritis Right Foot Drop  Social History:  Tobacco Use: none  PHYSICAL EXAM: BP (!) 168/78   Pulse 79   Temp 98.3 F (36.8 C) (Oral)   Wt 223 lb (101.2 kg)   SpO2 96%   BMI 31.10 kg/m  GEN: NAD HEENT: EOMI, sclera clear  CV: RRR, no murmurs, rubs, or gallops PULM: CTAB, normal effort ABD: Soft, nontender, nondistended, NABS, no organomegaly SKIN:  No rash or cyanosis; warm and well-perfused EXTR: No lower extremity edema or calf tenderness PSYCH: Mood and affect euthymic, normal rate and volume of speech NEURO: Awake, alert, no focal deficits grossly, normal speech Diabetic Foot Exam - Simple   Simple Foot Form Diabetic Foot exam was performed with the following findings:  Yes 05/10/2017  5:52 PM  Visual Inspection No deformities, no ulcerations, no other skin breakdown bilaterally:  Yes Sensation Testing Pulse Check Posterior Tibialis and Dorsalis pulse intact bilaterally:  Yes Comments Decreased sensation to monofilament testing bilatearally       ASSESSMENT/PLAN:  Health maintenance:  - sent Rx for Tdap and Shingrix. Recommended getting vaccines at different times as we don't know the efficacy of Shingrix when administered with another vaccine  Essential hypertension Not at goal. Persistently elevated BP at home as well. Will add Norvasc 5mg  daily. Patient doing to surgeon in one week and will call me with his BP measurements from that visit. Encouraged to start regular physical activity and continue low salt diet.  Follow up in 2 weeks.   Diabetes mellitus type 2, uncontrolled Foot exam done today with decreased sensation. Encouraged patient to restart checking CBGs daily. Next A1c due in August.   Dyslipidemia Continue Simvastatin. Next lipid in 07/2017   [name], MD PGY [place]

## 2017-05-10 ENCOUNTER — Encounter: Payer: Self-pay | Admitting: Internal Medicine

## 2017-05-10 ENCOUNTER — Ambulatory Visit (INDEPENDENT_AMBULATORY_CARE_PROVIDER_SITE_OTHER): Payer: Medicare Other | Admitting: Internal Medicine

## 2017-05-10 VITALS — BP 168/78 | HR 79 | Temp 98.3°F | Wt 223.0 lb

## 2017-05-10 DIAGNOSIS — S63276D Dislocation of unspecified interphalangeal joint of right little finger, subsequent encounter: Secondary | ICD-10-CM | POA: Diagnosis not present

## 2017-05-10 DIAGNOSIS — E785 Hyperlipidemia, unspecified: Secondary | ICD-10-CM

## 2017-05-10 DIAGNOSIS — E119 Type 2 diabetes mellitus without complications: Secondary | ICD-10-CM

## 2017-05-10 DIAGNOSIS — I2510 Atherosclerotic heart disease of native coronary artery without angina pectoris: Secondary | ICD-10-CM

## 2017-05-10 DIAGNOSIS — I10 Essential (primary) hypertension: Secondary | ICD-10-CM

## 2017-05-10 DIAGNOSIS — E1149 Type 2 diabetes mellitus with other diabetic neurological complication: Secondary | ICD-10-CM | POA: Diagnosis not present

## 2017-05-10 MED ORDER — AMLODIPINE BESYLATE 5 MG PO TABS: 5.0000 mg | ORAL_TABLET | Freq: Every day | ORAL | 0 refills | Status: DC

## 2017-05-10 MED ORDER — METOPROLOL TARTRATE 50 MG PO TABS: 50.0000 mg | ORAL_TABLET | Freq: Two times a day (BID) | ORAL | 0 refills | Status: DC

## 2017-05-10 MED ORDER — TETANUS-DIPHTH-ACELL PERTUSSIS 5-2-15.5 LF-MCG/0.5 IM SUSP: 0.5000 mL | Freq: Once | INTRAMUSCULAR | 0 refills | Status: AC

## 2017-05-10 MED ORDER — ZOSTER VAC RECOMB ADJUVANTED 50 MCG/0.5ML IM SUSR: 0.5000 mL | Freq: Once | INTRAMUSCULAR | 1 refills | Status: AC

## 2017-05-10 NOTE — Assessment & Plan Note (Addendum)
Not at goal. Persistently elevated BP at home as well. Will add Norvasc 5mg  daily. Patient doing to surgeon in one week and will call me with his BP measurements from that visit. Encouraged to start regular physical activity and continue low salt diet.  Follow up in 2 weeks.

## 2017-05-10 NOTE — Assessment & Plan Note (Signed)
Foot exam done today with decreased sensation. Encouraged patient to restart checking CBGs daily. Next A1c due in August.

## 2017-05-10 NOTE — Assessment & Plan Note (Signed)
Continue Simvastatin. Next lipid in 07/2017

## 2017-05-10 NOTE — Patient Instructions (Addendum)
We started Norvasc (amlodipine) 5 mg daily to get your blood pressure under better control.  Please call me to tell me your BP after your visit with the surgeon.   Follow up in 2 weeks.   We gave you prescription for tetanus and shingles vaccine. Make sure you send a record to [place] when you get it.

## 2017-05-15 ENCOUNTER — Other Ambulatory Visit: Payer: Self-pay | Admitting: Internal Medicine

## 2017-05-17 DIAGNOSIS — S63276D Dislocation of unspecified interphalangeal joint of right little finger, subsequent encounter: Secondary | ICD-10-CM | POA: Diagnosis not present

## 2017-05-23 NOTE — Progress Notes (Deleted)
   [facility] Phone: [phone]   Date of Visit: 05/24/2017   HPI:  HTN: - at last visit Norvasc 5mg  added (04/2017)  ROS: See HPI.  [place]:  HTN DM2 CAD s/p PTCA Mobitz type 1 second degree AV Block Hx Colon Polyp Hx of skin cancer BPH/ED Obesity Right Foot drop  PHYSICAL EXAM: There were no vitals taken for this visit. Gen: *** HEENT: *** Heart: *** Lungs: *** Neuro: *** Ext: ***  ASSESSMENT/PLAN:  Health maintenance:  -***  No problem-specific Assessment & Plan notes found for this encounter.  FOLLOW UP: Follow up in *** for ***  [name], MD PGY [place]

## 2017-05-24 ENCOUNTER — Ambulatory Visit: Payer: Medicare Other | Admitting: Internal Medicine

## 2017-05-29 NOTE — Progress Notes (Signed)
Colin Newman Date of Birth: 1932-11-29   History of Present Illness: Colin Newman is seen for  followup of CAD and HTN. He has a history of coronary disease and had stenting of the proximal LAD in 2005 with a bare-metal stent.   His last Myoview study in October 2013 was normal. He also has a history of marked first degree AV block. He has a history of HTN, DM, HLD,  1st degree AVB on ECG (progressing from 264>>386 ms from 2014>>2017). He was seen in our hypertensive clinic. He has been tried on multiple antihypertensives with multiple side effects.  Amlodipine - dizziness/edema Losartan - rash/angioedemia Lisinopril - itching HCTZ - itching Chlorthalidone - cost-prohibitive Spironolactone - caused him to wake 3-4 times per night to go to the bathroom  Hydralazine - caused him to be swimmy headed  He reports he is feeling well. He did fall and broke his pinky finger.  No chest pain, SOB, palpitations, dizziness, fatigue, or edema. He does have a stationary bike at home but admits he doesn't use it as much as he should.  His BP at home has been 140-150s.   Current Outpatient Prescriptions on File Prior to Visit  Medication Sig Dispense Refill  . amLODipine (NORVASC) 5 MG tablet Take 1 tablet (5 mg total) by mouth daily. 30 tablet 0  . aspirin 81 MG tablet Take 81 mg by mouth daily.      [name] atorvastatin (LIPITOR) 40 MG tablet TAKE 1 TABLET BY MOUTH ONCE DAILY 90 tablet 0  . glucose blood (ONE TOUCH ULTRA TEST) test strip Use for blood sugar checks once daily. 100 each 11  . metFORMIN (GLUCOPHAGE) 1000 MG tablet Take 1 tablet (1,000 mg total) by mouth 2 (two) times daily. 180 tablet 0  . metoprolol tartrate (LOPRESSOR) 50 MG tablet Take 1 tablet (50 mg total) by mouth 2 (two) times daily. 225 tablet 0  . Multiple Vitamin (MULTI-VITAMIN DAILY PO) Take 1 tablet by mouth 1 day or 1 dose.    . nitroGLYCERIN (NITROSTAT) 0.4 MG SL tablet Place 1 tablet (0.4 mg total) under the tongue every 5  (five) minutes as needed for chest pain. 25 tablet 11  . Omega-3 Fatty Acids (FISH OIL) 1200 MG CAPS Take 1 capsule by mouth daily.      Glory Rosebush DELICA LANCETS 58I MISC Use to check blood sugar once daily. 100 each 12  . saw palmetto 500 MG capsule Take 500 mg by mouth daily.    [name] spironolactone (ALDACTONE) 25 MG tablet Take 1 tablet (25 mg total) by mouth daily. 30 tablet 0   No current facility-administered medications on file prior to visit.     Allergies  Allergen Reactions  . Lisinopril Hives, Itching and Cough  . Losartan Hives    Hives, itching, swelling of face and tongue associated w/ taking this medication.  [name] Hctz [Hydrochlorothiazide] Itching    Past Medical History:  Diagnosis Date  . Constipation   . Diabetes mellitus type 2, noninsulin dependent ([place])       . Dyslipidemia    Hx with intolerance to statin therapy  . Hypertension   . Obesity   . Single vessel coronary disease    s/p stent LAD 2005    Past Surgical History:  Procedure Laterality Date  . BACK SURGERY     2 previous  . BILATERAL KNEE ARTHROSCOPY    . CARDIOVASCULAR STRESS TEST  07-20-2009   EF 73%  . OTHER  SURGICAL HISTORY     Stent in the proximal left anterior descending in 2005    History  Smoking Status  . Former Smoker  Smokeless Tobacco  . Architectural technologist  . Types: Chew    History  Alcohol Use No    Comment: Rarely has a beer    Family History  Problem Relation Age of Onset  . Pulmonary embolism Mother   . Cancer Father     Review of Systems: As noted in history of present illness. All other systems were reviewed and are negative.  Physical Exam: BP (!) 158/76   Pulse 70   Ht 5\' 11"  (1.803 m)   Wt 220 lb (99.8 kg)   BMI 30.68 kg/m  GENERAL:  Well appearing WM in NAD HEENT:  PERRL, EOMI, sclera are clear. Oropharynx is clear. NECK:  No jugular venous distention, carotid upstroke brisk and symmetric, no bruits, no thyromegaly or adenopathy LUNGS:  Clear to  auscultation bilaterally CHEST:  Unremarkable HEART:  RRR,  PMI not displaced or sustained,S1 and S2 within normal limits, no S3, no S4: no clicks, no rubs, no murmurs ABD:  Soft, nontender. BS +, no masses or bruits. No hepatomegaly, no splenomegaly EXT:  2 + pulses throughout, no edema, no cyanosis no clubbing SKIN:  Warm and dry.  No rashes NEURO:  Alert and oriented x 3. Cranial nerves II through XII intact. PSYCH:  Cognitively intact    LABORATORY DATA:  Lab Results  Component Value Date   WBC 5.4 08/21/2012   HGB 15.1 08/21/2012   HCT 42.9 08/21/2012   PLT 175 08/21/2012   GLUCOSE 165 (H) 04/06/2017   CHOL 102 (L) 07/17/2016   TRIG 160 (H) 07/17/2016   HDL 34 (L) 07/17/2016   LDLDIRECT 66 11/04/2013   LDLCALC 36 07/17/2016   ALT 15 03/30/2017   AST 13 03/30/2017   NA 141 04/06/2017   K 4.4 04/06/2017   CL 102 04/06/2017   CREATININE 1.08 04/06/2017   BUN 20 04/06/2017   CO2 21 04/06/2017   HGBA1C 7.0 03/30/2017   MICROALBUR 39.8 11/06/2016    ECG is  done today- NSR rate 70. First degree AV block with AV interval 320 msec. Otherwise normal.    Assessment / Plan: 1. Coronary disease status post stenting of the proximal LAD in 2005 with a bare-metal stent. Normal Myoview study in October 2013. He is asymptomatic. We will continue with his medical management. I'll followup again in 6 months.  2. Hypertension.  Elevated but control is reasonable for his multiple intolerances and orthostatic symptoms. Lucrezia [place] to multiple medications as listed.. For now will continue current therapy. Focus  on lifestyle modification with aerobic exercise, weight loss, and DASH diet.   3. Diabetes mellitus type 2.  4. Hyperlipidemia. Continue statin therapy. Well controlled.   5. Progressive PR prolongation. Improved today. Asymptomatic. Will continue current metoprolol dose for now.

## 2017-05-31 ENCOUNTER — Encounter: Payer: Self-pay | Admitting: Cardiology

## 2017-05-31 ENCOUNTER — Ambulatory Visit (INDEPENDENT_AMBULATORY_CARE_PROVIDER_SITE_OTHER): Payer: Medicare Other | Admitting: Cardiology

## 2017-05-31 VITALS — BP 158/76 | HR 70 | Ht 71.0 in | Wt 220.0 lb

## 2017-05-31 DIAGNOSIS — E785 Hyperlipidemia, unspecified: Secondary | ICD-10-CM | POA: Diagnosis not present

## 2017-05-31 DIAGNOSIS — I10 Essential (primary) hypertension: Secondary | ICD-10-CM | POA: Diagnosis not present

## 2017-05-31 DIAGNOSIS — I951 Orthostatic hypotension: Secondary | ICD-10-CM | POA: Diagnosis not present

## 2017-05-31 DIAGNOSIS — I2510 Atherosclerotic heart disease of native coronary artery without angina pectoris: Secondary | ICD-10-CM

## 2017-05-31 NOTE — Patient Instructions (Signed)
Continue your current therapy  I will see you in 6 months.   

## 2017-05-31 NOTE — Addendum Note (Signed)
Addended by: Kathyrn Lass on: 05/31/2017 10:01 AM   Modules accepted: Orders

## 2017-06-08 ENCOUNTER — Other Ambulatory Visit: Payer: Self-pay | Admitting: Internal Medicine

## 2017-06-08 DIAGNOSIS — S63276D Dislocation of unspecified interphalangeal joint of right little finger, subsequent encounter: Secondary | ICD-10-CM | POA: Diagnosis not present

## 2017-06-11 NOTE — Telephone Encounter (Signed)
Pt contacted and scheduled for Friday with pcp for BP follow up.

## 2017-06-11 NOTE — Telephone Encounter (Signed)
Patient was to follow up for BP in 2 weeks but never made an appointment. Please inform patient.

## 2017-06-13 NOTE — Progress Notes (Signed)
   [facility] Phone: [phone]   Date of Visit: 06/15/2017   HPI:  HTN: - Medications: Norvasc 5mg , Spironolactone 25 mg daily, Metoprolol 50 BID.  - compliant with medications - denies chest pain, shortness of breath, HA, blurred vision - reports he is watching his salt intake, but still does eat canned foods at times. Discussed avoiding canned and going with frozen  - rides stationary bike for 25-30 mins a day - reports he has been on saw palmetto for over 20 years. Denies any symptoms of BPH such as urinary frequency, decreased emptying of bladder, weak stream.  - reports his BP values are usually 150s/80 at home.   Questionable History of Skin Cancer: - patient does not recall diagnosis of skin cancer or actinic keratosis. This is on his problem list  - has never been to dermatologist  - does report of significant sun exposure as a teenager - per chart review, had shave biopsy in 12/2012 but benign (seborheic keratosis)   ROS: See HPI.  [place]:  PMH:  Obesity  HTN- difficult to treat due to multiple med allergies  DM2  HLD CAD s/p stenting proximal LAD in 2005; [place]itz type 1 second degree AV block BPH, ED  Hx of Colon Polyp  Hx of Skin Cancer  Hemorrhoids Osteoarthritis Right Foot Drop  Social History:  Tobacco Use: none  PHYSICAL EXAM: BP 138/78   Pulse 74   Temp 98.6 F (37 C) (Oral)   Ht 5\' 11"  (1.803 m)   Wt 217 lb (98.4 kg)   SpO2 97%   BMI 30.27 kg/m  GEN: NAD  CV: RRR, no murmurs, rubs, or gallops PULM: CTAB, normal effort ABD: Soft, nontender, nondistended, NABS, no organomegaly SKIN: No rash or cyanosis; warm and well-perfused, significant sun damage over time, there are areas that are rough and scaly mainly on the left dorsal forearm and bilateral lower extremities ?actinic keratosis?  EXTR: No lower extremity edema or calf tenderness PSYCH: Mood and affect euthymic, normal rate and volume of speech NEURO: Awake,  alert, no focal deficits grossly, normal speech   ASSESSMENT/PLAN:  Essential hypertension Well controlled BP today in clinic. I asked that he brings his BP monitor from home for next visit to check accuracy since he is getting higher values at home. Reiterated importance of avoiding canned foods. Continue daily exercise. Continue current medications: Norvasc 5mg , Spironolactone 25 mg daily, Metoprolol 50 BID. Nitroglycerin refilled per patient request (he has not been having symptoms but wants to have at hand in case). Follow up in December.   ACTINIC KERATOSIS He has dry scaly regions on forearms and bilateral legs. He also has other signs of sun damage. Discussed referral to dermatology for detailed skin check and possibly topical treatment for possibly actinic keratosis. He would like to think about this first.    [name], MD PGY [place]

## 2017-06-15 ENCOUNTER — Encounter: Payer: Self-pay | Admitting: Internal Medicine

## 2017-06-15 ENCOUNTER — Ambulatory Visit (INDEPENDENT_AMBULATORY_CARE_PROVIDER_SITE_OTHER): Payer: Medicare Other | Admitting: Internal Medicine

## 2017-06-15 DIAGNOSIS — I10 Essential (primary) hypertension: Secondary | ICD-10-CM

## 2017-06-15 DIAGNOSIS — L570 Actinic keratosis: Secondary | ICD-10-CM

## 2017-06-15 DIAGNOSIS — I2510 Atherosclerotic heart disease of native coronary artery without angina pectoris: Secondary | ICD-10-CM

## 2017-06-15 MED ORDER — NITROGLYCERIN 0.4 MG SL SUBL: 0.4000 mg | SUBLINGUAL_TABLET | SUBLINGUAL | 0 refills | Status: DC | PRN

## 2017-06-15 NOTE — Assessment & Plan Note (Signed)
He has dry scaly regions on forearms and bilateral legs. He also has other signs of sun damage. Discussed referral to dermatology for detailed skin check and possibly topical treatment for possibly actinic keratosis. He would like to think about this first.

## 2017-06-15 NOTE — Patient Instructions (Addendum)
Thank you for coming in!  You blood pressure looks great! Continue your current medications.  Please stop using saw palmetto, and watch for BPH symptoms  Bring your blood pressure monitor to our next visit in December.  Please think about referral to dermatology

## 2017-06-15 NOTE — Assessment & Plan Note (Signed)
Patient is currently asymptomatic. Discussed the possible side effects of saw palmetto (elevate BP and increased risk of bleeding). He would like to discontinue. He does not want to replace with another medication but would like to monitor for symptoms.

## 2017-06-15 NOTE — Assessment & Plan Note (Signed)
Well controlled BP today in clinic. I asked that he brings his BP monitor from home for next visit to check accuracy since he is getting higher values at home. Reiterated importance of avoiding canned foods. Continue daily exercise. Continue current medications: Norvasc 5mg , Spironolactone 25 mg daily, Metoprolol 50 BID. Nitroglycerin refilled per patient request (he has not been having symptoms but wants to have at hand in case). Follow up in December.

## 2017-06-19 ENCOUNTER — Telehealth: Payer: Self-pay

## 2017-06-19 ENCOUNTER — Other Ambulatory Visit: Payer: Self-pay | Admitting: Pharmacist Clinician (PhC)/ Clinical Pharmacy Specialist

## 2017-06-19 MED ORDER — SPIRONOLACTONE 25 MG PO TABS: 25.0000 mg | ORAL_TABLET | Freq: Every day | ORAL | 1 refills | Status: DC

## 2017-06-19 NOTE — Telephone Encounter (Signed)
Received surgical clearance from [facility].Dr.Jordan cleared patient for upcoming surgery.Advised patient should continue aspirin.Clearance faxed back to fax # [phone].

## 2017-07-04 DIAGNOSIS — Z472 Encounter for removal of internal fixation device: Secondary | ICD-10-CM | POA: Diagnosis not present

## 2017-07-04 DIAGNOSIS — M24541 Contracture, right hand: Secondary | ICD-10-CM | POA: Diagnosis not present

## 2017-07-04 DIAGNOSIS — T84498A Other mechanical complication of other internal orthopedic devices, implants and grafts, initial encounter: Secondary | ICD-10-CM | POA: Diagnosis not present

## 2017-07-04 DIAGNOSIS — M65841 Other synovitis and tenosynovitis, right hand: Secondary | ICD-10-CM | POA: Diagnosis not present

## 2017-07-09 ENCOUNTER — Other Ambulatory Visit: Payer: Self-pay | Admitting: Internal Medicine

## 2017-07-10 DIAGNOSIS — S63276D Dislocation of unspecified interphalangeal joint of right little finger, subsequent encounter: Secondary | ICD-10-CM | POA: Diagnosis not present

## 2017-07-12 DIAGNOSIS — S63276D Dislocation of unspecified interphalangeal joint of right little finger, subsequent encounter: Secondary | ICD-10-CM | POA: Diagnosis not present

## 2017-07-16 DIAGNOSIS — S63276D Dislocation of unspecified interphalangeal joint of right little finger, subsequent encounter: Secondary | ICD-10-CM | POA: Diagnosis not present

## 2017-07-17 DIAGNOSIS — S63276D Dislocation of unspecified interphalangeal joint of right little finger, subsequent encounter: Secondary | ICD-10-CM | POA: Diagnosis not present

## 2017-07-19 DIAGNOSIS — S63276D Dislocation of unspecified interphalangeal joint of right little finger, subsequent encounter: Secondary | ICD-10-CM | POA: Diagnosis not present

## 2017-07-23 DIAGNOSIS — S63276D Dislocation of unspecified interphalangeal joint of right little finger, subsequent encounter: Secondary | ICD-10-CM | POA: Diagnosis not present

## 2017-07-25 DIAGNOSIS — S63276D Dislocation of unspecified interphalangeal joint of right little finger, subsequent encounter: Secondary | ICD-10-CM | POA: Diagnosis not present

## 2017-07-30 DIAGNOSIS — S63276D Dislocation of unspecified interphalangeal joint of right little finger, subsequent encounter: Secondary | ICD-10-CM | POA: Diagnosis not present

## 2017-08-01 DIAGNOSIS — S63276D Dislocation of unspecified interphalangeal joint of right little finger, subsequent encounter: Secondary | ICD-10-CM | POA: Diagnosis not present

## 2017-08-06 ENCOUNTER — Other Ambulatory Visit: Payer: Self-pay | Admitting: Internal Medicine

## 2017-08-07 DIAGNOSIS — S63276D Dislocation of unspecified interphalangeal joint of right little finger, subsequent encounter: Secondary | ICD-10-CM | POA: Diagnosis not present

## 2017-08-09 DIAGNOSIS — S63276D Dislocation of unspecified interphalangeal joint of right little finger, subsequent encounter: Secondary | ICD-10-CM | POA: Diagnosis not present

## 2017-08-13 ENCOUNTER — Other Ambulatory Visit: Payer: Self-pay | Admitting: Internal Medicine

## 2017-08-14 DIAGNOSIS — S63276D Dislocation of unspecified interphalangeal joint of right little finger, subsequent encounter: Secondary | ICD-10-CM | POA: Diagnosis not present

## 2017-09-10 ENCOUNTER — Other Ambulatory Visit: Payer: Self-pay | Admitting: Internal Medicine

## 2017-09-10 DIAGNOSIS — I10 Essential (primary) hypertension: Secondary | ICD-10-CM

## 2017-09-10 NOTE — Telephone Encounter (Signed)
Please inform patient that he should be seen in clinic if he has been needing this medication.

## 2017-09-14 NOTE — Telephone Encounter (Signed)
Contacted pt and he informed me he has not been needing this medication. Pt stated he lost his other little bottle and it makes him feel better to "have some in his pocket." Pt has not had to take this medication in over 4 years, per pt.

## 2017-09-15 DIAGNOSIS — J181 Lobar pneumonia, unspecified organism: Secondary | ICD-10-CM | POA: Diagnosis not present

## 2017-09-15 DIAGNOSIS — R05 Cough: Secondary | ICD-10-CM | POA: Diagnosis not present

## 2017-10-11 ENCOUNTER — Other Ambulatory Visit: Payer: Self-pay | Admitting: *Deleted

## 2017-10-12 ENCOUNTER — Other Ambulatory Visit: Payer: Self-pay | Admitting: Internal Medicine

## 2017-10-12 MED ORDER — AMLODIPINE BESYLATE 5 MG PO TABS: 5.0000 mg | ORAL_TABLET | Freq: Every day | ORAL | 3 refills | Status: DC

## 2017-10-12 MED ORDER — METOPROLOL TARTRATE 50 MG PO TABS: 50.0000 mg | ORAL_TABLET | Freq: Two times a day (BID) | ORAL | 0 refills | Status: DC

## 2017-10-12 MED ORDER — METFORMIN HCL 1000 MG PO TABS: 1000.0000 mg | ORAL_TABLET | Freq: Two times a day (BID) | ORAL | 0 refills | Status: DC

## 2017-11-07 ENCOUNTER — Other Ambulatory Visit: Payer: Self-pay | Admitting: *Deleted

## 2017-11-07 MED ORDER — ATORVASTATIN CALCIUM 40 MG PO TABS: 40.0000 mg | ORAL_TABLET | Freq: Every day | ORAL | 2 refills | Status: DC

## 2017-11-20 NOTE — Progress Notes (Deleted)
[name] Date of Birth: 10-18-1932   History of Present Illness: Mr. Mclester is seen for  followup of CAD and HTN. He has a history of coronary disease and had stenting of the proximal LAD in 2005 with a bare-metal stent.   His last Myoview study in October 2013 was normal. He also has a history of marked first degree AV block. He has a history of HTN, DM, HLD,  1st degree AVB on ECG (progressing from 264>>386 ms from 2014>>2017). He was seen in our hypertensive clinic. He has been tried on multiple antihypertensives with multiple side effects.  Amlodipine - dizziness/edema Losartan - rash/angioedemia Lisinopril - itching HCTZ - itching Chlorthalidone - cost-prohibitive Spironolactone - caused him to wake 3-4 times per night to go to the bathroom  Hydralazine - caused him to be swimmy headed  He reports he is feeling well. He did fall and broke his pinky finger.  No chest pain, SOB, palpitations, dizziness, fatigue, or edema. He does have a stationary bike at home but admits he doesn't use it as much as he should.  His BP at home has been 140-150s.   Current Outpatient Medications on File Prior to Visit  Medication Sig Dispense Refill  . amLODipine (NORVASC) 5 MG tablet Take 1 tablet (5 mg total) by mouth daily. 90 tablet 3  . aspirin 81 MG tablet Take 81 mg by mouth daily.      [name] atorvastatin (LIPITOR) 40 MG tablet Take 1 tablet (40 mg total) by mouth daily. 90 tablet 2  . glucose blood (ONE TOUCH ULTRA TEST) test strip Use for blood sugar checks once daily. 100 each 11  . metFORMIN (GLUCOPHAGE) 1000 MG tablet Take 1 tablet (1,000 mg total) by mouth 2 (two) times daily. 180 tablet 0  . metoprolol tartrate (LOPRESSOR) 50 MG tablet Take 1 tablet (50 mg total) by mouth 2 (two) times daily. 225 tablet 0  . Multiple Vitamin (MULTI-VITAMIN DAILY PO) Take 1 tablet by mouth 1 day or 1 dose.    . nitroGLYCERIN (NITROSTAT) 0.4 MG SL tablet DISSOLVE ONE TABLET UNDER THE TONGUE EVERY 5  MINUTES AS NEEDED FOR CHEST PAIN.  DO NOT EXCEED A TOTAL OF 3 DOSES IN 15 MINUTES 10 tablet 0  . Omega-3 Fatty Acids (FISH OIL) 1200 MG CAPS Take 1 capsule by mouth daily.      Glory Rosebush DELICA LANCETS 24M MISC Use to check blood sugar once daily. 100 each 12  . spironolactone (ALDACTONE) 25 MG tablet Take 1 tablet (25 mg total) by mouth daily. 90 tablet 1   No current facility-administered medications on file prior to visit.     Allergies  Allergen Reactions  . Lisinopril Hives, Itching and Cough  . Losartan Hives    Hives, itching, swelling of face and tongue associated w/ taking this medication.  [name] Hctz [Hydrochlorothiazide] Itching    Past Medical History:  Diagnosis Date  . Constipation   . Diabetes mellitus type 2, noninsulin dependent ([place])       . Dyslipidemia    Hx with intolerance to statin therapy  . Hypertension   . Obesity   . Single vessel coronary disease    s/p stent LAD 2005    Past Surgical History:  Procedure Laterality Date  . BACK SURGERY     2 previous  . BILATERAL KNEE ARTHROSCOPY    . CARDIOVASCULAR STRESS TEST  07-20-2009   EF 73%  . OTHER SURGICAL HISTORY  Stent in the proximal left anterior descending in 2005    Social History   Tobacco Use  Smoking Status Former Smoker  Smokeless Tobacco Current User  . Types: Chew    Social History   Substance and Sexual Activity  Alcohol Use No  . Alcohol/week: 0.0 oz   Comment: Rarely has a beer    Family History  Problem Relation Age of Onset  . Pulmonary embolism Mother   . Cancer Father     Review of Systems: As noted in history of present illness. All other systems were reviewed and are negative.  Physical Exam: There were no vitals taken for this visit. GENERAL:  Well appearing WM in NAD HEENT:  PERRL, EOMI, sclera are clear. Oropharynx is clear. NECK:  No jugular venous distention, carotid upstroke brisk and symmetric, no bruits, no thyromegaly or adenopathy LUNGS:  Clear to  auscultation bilaterally CHEST:  Unremarkable HEART:  RRR,  PMI not displaced or sustained,S1 and S2 within normal limits, no S3, no S4: no clicks, no rubs, no murmurs ABD:  Soft, nontender. BS +, no masses or bruits. No hepatomegaly, no splenomegaly EXT:  2 + pulses throughout, no edema, no cyanosis no clubbing SKIN:  Warm and dry.  No rashes NEURO:  Alert and oriented x 3. Cranial nerves II through XII intact. PSYCH:  Cognitively intact    LABORATORY DATA:  Lab Results  Component Value Date   WBC 5.4 08/21/2012   HGB 15.1 08/21/2012   HCT 42.9 08/21/2012   PLT 175 08/21/2012   GLUCOSE 165 (H) 04/06/2017   CHOL 102 (L) 07/17/2016   TRIG 160 (H) 07/17/2016   HDL 34 (L) 07/17/2016   LDLDIRECT 66 11/04/2013   LDLCALC 36 07/17/2016   ALT 15 03/30/2017   AST 13 03/30/2017   NA 141 04/06/2017   K 4.4 04/06/2017   CL 102 04/06/2017   CREATININE 1.08 04/06/2017   BUN 20 04/06/2017   CO2 21 04/06/2017   HGBA1C 7.0 03/30/2017   MICROALBUR 39.8 11/06/2016    ECG is  done today- NSR rate 70. First degree AV block with AV interval 320 msec. Otherwise normal.    Assessment / Plan: 1. Coronary disease status post stenting of the proximal LAD in 2005 with a bare-metal stent. Normal Myoview study in October 2013. He is asymptomatic. We will continue with his medical management. I'll followup again in 6 months.  2. Hypertension.  Elevated but control is reasonable for his multiple intolerances and orthostatic symptoms. Lucrezia [place] to multiple medications as listed.. For now will continue current therapy. Focus  on lifestyle modification with aerobic exercise, weight loss, and DASH diet.   3. Diabetes mellitus type 2.  4. Hyperlipidemia. Continue statin therapy. Well controlled.   5. Progressive PR prolongation. Improved today. Asymptomatic. Will continue current metoprolol dose for now.

## 2017-11-21 ENCOUNTER — Ambulatory Visit: Payer: Medicare Other | Admitting: Cardiology

## 2017-11-25 NOTE — Progress Notes (Signed)
Colin Newman Date of Birth: 1933-02-04   History of Present Illness: [name] is seen for  followup of CAD and HTN. He has a history of coronary disease and had stenting of the proximal LAD in 2005 with a bare-metal stent.   His last Myoview study in October 2013 was normal. He also has a history of marked first degree AV block. He has a history of HTN, DM, HLD,  1st degree AVB on ECG (progressing from 264>>386 ms from 2014>>2017). He was seen in our hypertensive clinic. He has been tried on multiple antihypertensives with multiple side effects.  Amlodipine - dizziness/edema Losartan - rash/angioedemia Lisinopril - itching HCTZ - itching Chlorthalidone - cost-prohibitive Spironolactone - caused him to wake 3-4 times per night to go to the bathroom  Hydralazine - caused him to be swimmy headed  On follow up today ehreports he is feeling well. He denies any  chest pain, SOB, palpitations, dizziness, fatigue, or edema.  His BP at home has been 150s.   Current Outpatient Medications on File Prior to Visit  Medication Sig Dispense Refill  . amLODipine (NORVASC) 5 MG tablet Take 1 tablet (5 mg total) by mouth daily. 90 tablet 3  . aspirin 81 MG tablet Take 81 mg by mouth daily.      [name] atorvastatin (LIPITOR) 40 MG tablet Take 1 tablet (40 mg total) by mouth daily. 90 tablet 2  . glucose blood (ONE TOUCH ULTRA TEST) test strip Use for blood sugar checks once daily. 100 each 11  . metFORMIN (GLUCOPHAGE) 1000 MG tablet Take 1 tablet (1,000 mg total) by mouth 2 (two) times daily. 180 tablet 0  . metoprolol tartrate (LOPRESSOR) 50 MG tablet Take 1 tablet (50 mg total) by mouth 2 (two) times daily. 225 tablet 0  . Multiple Vitamin (MULTI-VITAMIN DAILY PO) Take 1 tablet by mouth 1 day or 1 dose.    . nitroGLYCERIN (NITROSTAT) 0.4 MG SL tablet DISSOLVE ONE TABLET UNDER THE TONGUE EVERY 5 MINUTES AS NEEDED FOR CHEST PAIN.  DO NOT EXCEED A TOTAL OF 3 DOSES IN 15 MINUTES 10 tablet 0  . Omega-3  Fatty Acids (FISH OIL) 1200 MG CAPS Take 1 capsule by mouth daily.      Glory Rosebush DELICA LANCETS 06C MISC Use to check blood sugar once daily. 100 each 12  . spironolactone (ALDACTONE) 25 MG tablet Take 1 tablet (25 mg total) by mouth daily. 90 tablet 1   No current facility-administered medications on file prior to visit.     Allergies  Allergen Reactions  . Lisinopril Hives, Itching and Cough  . Losartan Hives    Hives, itching, swelling of face and tongue associated w/ taking this medication.  [name] Hctz [Hydrochlorothiazide] Itching    Past Medical History:  Diagnosis Date  . Constipation   . Diabetes mellitus type 2, noninsulin dependent ([place])       . Dyslipidemia    Hx with intolerance to statin therapy  . Hypertension   . Obesity   . Single vessel coronary disease    s/p stent LAD 2005    Past Surgical History:  Procedure Laterality Date  . BACK SURGERY     2 previous  . BILATERAL KNEE ARTHROSCOPY    . CARDIOVASCULAR STRESS TEST  07-20-2009   EF 73%  . 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 Alcohol/week: 0.0 oz   Comment: Rarely has a beer    Family History  Problem Relation Age of Onset  . Pulmonary embolism Mother   . Cancer Father     Review of Systems: As noted in history of present illness. All other systems were reviewed and are negative.  Physical Exam: BP (!) 150/70 (BP Location: Right Arm, Cuff Size: Normal)   Pulse 73   Ht 5\' 11"  (1.803 m)   Wt 224 lb 9.6 oz (101.9 kg)   SpO2 96%   BMI 31.33 kg/m  GENERAL:  Well appearing, WM in NAD HEENT:  PERRL, EOMI, sclera are clear. Oropharynx is clear. NECK:  No jugular venous distention, carotid upstroke brisk and symmetric, no bruits, no thyromegaly or adenopathy LUNGS:   Clear to auscultation bilaterally CHEST:  Unremarkable HEART:  RRR,  PMI not displaced or sustained,S1 and S2 within normal limits, no S3, no S4: no clicks, no rubs, no murmurs ABD:  Soft, nontender. BS +, no masses or bruits. No hepatomegaly, no splenomegaly EXT:  2 + pulses throughout, no edema, no cyanosis no clubbing SKIN:  Warm and dry.  No rashes NEURO:  Alert and oriented x 3. Cranial nerves II through XII intact. Balance is poor.  PSYCH:  Cognitively intact      LABORATORY DATA:  Lab Results  Component Value Date   WBC 5.4 08/21/2012   HGB 15.1 08/21/2012   HCT 42.9 08/21/2012   PLT 175 08/21/2012   GLUCOSE 165 (H) 04/06/2017   CHOL 102 (L) 07/17/2016   TRIG 160 (H) 07/17/2016   HDL 34 (L) 07/17/2016   LDLDIRECT 66 11/04/2013   LDLCALC 36 07/17/2016   ALT 15 03/30/2017   AST 13 03/30/2017   NA 141 04/06/2017   K 4.4 04/06/2017   CL 102 04/06/2017   CREATININE 1.08 04/06/2017   BUN 20 04/06/2017   CO2 21 04/06/2017   HGBA1C 7.0 03/30/2017   MICROALBUR 39.8 11/06/2016     Assessment / Plan: 1. Coronary disease status post stenting of the proximal LAD in 2005 with a bare-metal stent. Normal Myoview study in October 2013. He is asymptomatic. We will continue with his medical management. I'll followup again in 6 months.  2. Hypertension.  BP is pretty good today. He has multiple drug intolerances and orthostatic symptoms.Lucrezia [place] to multiple medications as listed.. For now will continue current therapy. Focus  on lifestyle modification with aerobic exercise, weight loss, and DASH diet.   3. Diabetes mellitus type 2.  4. Hyperlipidemia. Continue statin therapy. Recommend he have a lipid panel done with next lab draw.  5. Progressive PR prolongation.  Asymptomatic. Will continue current metoprolol dose for now.

## 2017-11-28 ENCOUNTER — Encounter: Payer: Self-pay | Admitting: Cardiology

## 2017-11-28 ENCOUNTER — Ambulatory Visit (INDEPENDENT_AMBULATORY_CARE_PROVIDER_SITE_OTHER): Payer: Medicare Other | Admitting: Cardiology

## 2017-11-28 VITALS — BP 150/70 | HR 73 | Ht 71.0 in | Wt 224.6 lb

## 2017-11-28 DIAGNOSIS — I2510 Atherosclerotic heart disease of native coronary artery without angina pectoris: Secondary | ICD-10-CM | POA: Diagnosis not present

## 2017-11-28 DIAGNOSIS — E785 Hyperlipidemia, unspecified: Secondary | ICD-10-CM | POA: Diagnosis not present

## 2017-11-28 DIAGNOSIS — I10 Essential (primary) hypertension: Secondary | ICD-10-CM

## 2017-11-28 NOTE — Patient Instructions (Signed)
Continue your current therapy  I will see you in 6 months.   

## 2017-12-10 DIAGNOSIS — I10 Essential (primary) hypertension: Secondary | ICD-10-CM | POA: Diagnosis not present

## 2017-12-10 DIAGNOSIS — J189 Pneumonia, unspecified organism: Secondary | ICD-10-CM | POA: Diagnosis not present

## 2017-12-10 DIAGNOSIS — E119 Type 2 diabetes mellitus without complications: Secondary | ICD-10-CM | POA: Diagnosis not present

## 2017-12-10 DIAGNOSIS — R05 Cough: Secondary | ICD-10-CM | POA: Diagnosis not present

## 2017-12-10 DIAGNOSIS — R062 Wheezing: Secondary | ICD-10-CM | POA: Diagnosis not present

## 2017-12-17 DIAGNOSIS — E113393 Type 2 diabetes mellitus with moderate nonproliferative diabetic retinopathy without macular edema, bilateral: Secondary | ICD-10-CM | POA: Diagnosis not present

## 2017-12-18 ENCOUNTER — Other Ambulatory Visit: Payer: Self-pay

## 2017-12-19 MED ORDER — METOPROLOL TARTRATE 50 MG PO TABS: 50.0000 mg | ORAL_TABLET | Freq: Two times a day (BID) | ORAL | 2 refills | Status: DC

## 2017-12-25 ENCOUNTER — Other Ambulatory Visit: Payer: Self-pay | Admitting: Cardiology

## 2018-01-21 DIAGNOSIS — R0609 Other forms of dyspnea: Secondary | ICD-10-CM | POA: Diagnosis not present

## 2018-01-21 DIAGNOSIS — E119 Type 2 diabetes mellitus without complications: Secondary | ICD-10-CM | POA: Diagnosis not present

## 2018-01-21 DIAGNOSIS — I10 Essential (primary) hypertension: Secondary | ICD-10-CM | POA: Diagnosis not present

## 2018-01-21 DIAGNOSIS — E7800 Pure hypercholesterolemia, unspecified: Secondary | ICD-10-CM | POA: Diagnosis not present

## 2018-01-22 ENCOUNTER — Other Ambulatory Visit: Payer: Self-pay | Admitting: *Deleted

## 2018-01-23 MED ORDER — METFORMIN HCL 1000 MG PO TABS: 1000.0000 mg | ORAL_TABLET | Freq: Two times a day (BID) | ORAL | 0 refills | Status: DC

## 2018-03-06 ENCOUNTER — Telehealth: Payer: Self-pay | Admitting: Cardiology

## 2018-03-06 NOTE — Telephone Encounter (Signed)
Most likely this is related to PNA in April but he is at higher risk for heart block. Should at least get an Ecg. Agree with follow up with PCP but if Ecg is abnormal or symptoms persist he will need to be seen.  Peter [name] MD, [facility] [place]

## 2018-03-06 NOTE — Telephone Encounter (Signed)
Spoke to patient Dr.Jordan's advice given.Appointment scheduled with Jory Sims DNP 03/14/18 at 11:00 am.

## 2018-03-06 NOTE — Telephone Encounter (Signed)
Return call to pt who states he has been experiencing SOB both at rest and with exertion. He denies any CP both states he has no energy. Pt states he weighs himself weekly and hasn't seen an increase in weight. He reports swelling in knees but states he has had bilateral knee replacement. Pt states he had Pneumonia in the beginning of April and has been feeling this way since then. Pt advised to contact PCP and inform him of his symptoms but will also route to Dr. [name] for further recommendation.

## 2018-03-06 NOTE — Telephone Encounter (Signed)
Pt c/o Shortness Of Breath: STAT if SOB developed within the last 24 hours or pt is noticeably SOB on the phone  1. Are you currently SOB (can you hear that pt is SOB on the phone)?yes  2. How long have you been experiencing SOB? Every since he had pneumonia in February  3. Are you SOB when sitting or when up moving around? Both  4. Are you currently experiencing any other symptoms? No

## 2018-03-14 ENCOUNTER — Encounter: Payer: Self-pay | Admitting: Adult Health

## 2018-03-14 ENCOUNTER — Ambulatory Visit (INDEPENDENT_AMBULATORY_CARE_PROVIDER_SITE_OTHER): Payer: Medicare Other | Admitting: Adult Health

## 2018-03-14 VITALS — BP 148/82 | HR 62 | Ht 71.0 in | Wt 232.6 lb

## 2018-03-14 DIAGNOSIS — Z79899 Other long term (current) drug therapy: Secondary | ICD-10-CM | POA: Diagnosis not present

## 2018-03-14 DIAGNOSIS — R079 Chest pain, unspecified: Secondary | ICD-10-CM

## 2018-03-14 DIAGNOSIS — I519 Heart disease, unspecified: Secondary | ICD-10-CM | POA: Diagnosis not present

## 2018-03-14 DIAGNOSIS — I10 Essential (primary) hypertension: Secondary | ICD-10-CM | POA: Diagnosis not present

## 2018-03-14 DIAGNOSIS — I2510 Atherosclerotic heart disease of native coronary artery without angina pectoris: Secondary | ICD-10-CM

## 2018-03-14 DIAGNOSIS — I4891 Unspecified atrial fibrillation: Secondary | ICD-10-CM

## 2018-03-14 DIAGNOSIS — I5021 Acute systolic (congestive) heart failure: Secondary | ICD-10-CM | POA: Diagnosis not present

## 2018-03-14 MED ORDER — APIXABAN 5 MG PO TABS: 5.0000 mg | ORAL_TABLET | Freq: Two times a day (BID) | ORAL | 6 refills | Status: DC

## 2018-03-14 MED ORDER — FUROSEMIDE 20 MG PO TABS: 20.0000 mg | ORAL_TABLET | Freq: Every day | ORAL | 3 refills | Status: DC

## 2018-03-14 NOTE — Progress Notes (Signed)
Cardiology Office Note   Date:  03/14/2018   ID:  Colin Newman, DOB [birth year], MRN [id number]  PCP:  [name], MD  Cardiologist: [name]  Chief Complaint  Patient presents with  . Follow-up    EKG, SOB noted     History of Present Illness: Colin Newman is a 84 y.o. male who presents for ongoing assessment and management of CAD with hx of stent to the proximal LAD in 2005, marked 1st degree AV block, hypertension, diabetes, and hyperlipidemia. He is intolerant to multiple antihypertensives (copied for accuracy):  Amlodipine - dizziness/edema Losartan - rash/angioedemia Lisinopril - itching HCTZ - itching Chlorthalidone - cost-prohibitive Spironolactone - caused him to wake 3-4 times per night to go to the bathroom  Hydralazine - caused him to be swimmy headed  He is currently on amlodipine and spironolactone. He was last seen by Dr. [name] on 11/28/2017 and had his BP controlled. He will need labs drawn today.    The patient has been experiencing worsening dyspnea on exertion.  He states just walking to the mailbox his breathing status causes him to have to stop and rest.  His family members have also noticed that his energy level has decreased and his breathing status has worsened.  He has put on approximately 8 to 10 pounds since being seen last.  He admits to not adhering to a diabetic diet and has been eating a lot of potato chips and salted foods.  Past Medical History:  Diagnosis Date  . Constipation   . Diabetes mellitus type 2, noninsulin dependent ([place])       . Dyslipidemia    Hx with intolerance to statin therapy  . Hypertension   . Obesity   . Single vessel coronary disease    s/p stent LAD 2005    Past Surgical History:  Procedure Laterality Date  . BACK SURGERY     2 previous  . BILATERAL KNEE ARTHROSCOPY    . CARDIOVASCULAR STRESS TEST  07-20-2009   EF 73%  . OTHER SURGICAL HISTORY     Stent in the proximal left anterior descending in 2005       Current Outpatient Medications  Medication Sig Dispense Refill  . amLODipine (NORVASC) 5 MG tablet Take 1 tablet (5 mg total) by mouth daily. 90 tablet 3  . atorvastatin (LIPITOR) 40 MG tablet Take 1 tablet (40 mg total) by mouth daily. 90 tablet 2  . glucose blood (ONE TOUCH ULTRA TEST) test strip Use for blood sugar checks once daily. 100 each 11  . metFORMIN (GLUCOPHAGE) 1000 MG tablet Take 1 tablet (1,000 mg total) by mouth 2 (two) times daily. 180 tablet 0  . metoprolol tartrate (LOPRESSOR) 50 MG tablet Take 1 tablet (50 mg total) by mouth 2 (two) times daily. 180 tablet 2  . Multiple Vitamin (MULTI-VITAMIN DAILY PO) Take 1 tablet by mouth 1 day or 1 dose.    . nitroGLYCERIN (NITROSTAT) 0.4 MG SL tablet DISSOLVE ONE TABLET UNDER THE TONGUE EVERY 5 MINUTES AS NEEDED FOR CHEST PAIN.  DO NOT EXCEED A TOTAL OF 3 DOSES IN 15 MINUTES 10 tablet 0  . Omega-3 Fatty Acids (FISH OIL) 1200 MG CAPS Take 1 capsule by mouth daily.      Colin Newman Use to check blood sugar once daily. 100 each 12  . spironolactone (ALDACTONE) 25 MG tablet TAKE 1 TABLET BY MOUTH ONCE DAILY 90 tablet 1  . apixaban (ELIQUIS) 5 MG TABS  tablet Take 1 tablet (5 mg total) by mouth 2 (two) times daily. 60 tablet 6  . furosemide (LASIX) 20 MG tablet Take 1 tablet (20 mg total) by mouth daily. 30 tablet 3   No current facility-administered medications for this visit.     Allergies:   Lisinopril; Losartan; and Hctz [hydrochlorothiazide]    Social History:  The patient  reports that he has quit smoking. His smokeless tobacco use includes chew. He reports that he does not drink alcohol.   Family History:  The patient's family history includes Cancer in his father; Pulmonary embolism in his mother.    ROS: All other systems are reviewed and negative. Unless otherwise mentioned in H&P    PHYSICAL EXAM: VS:  BP (!) 148/82 (BP Location: Left Arm)   Pulse 62   Ht 5\' 11"  (1.803 m)   Wt 232 lb 9.6 oz  (105.5 kg)   BMI 32.44 kg/m  , BMI Body mass index is 32.44 kg/m. GEN: Well nourished, well developed, in no acute distress  HEENT: normal  Neck: no JVD, carotid bruits, or masses Cardiac: IRRR; bradycardic, no murmurs, rubs, or gallops, 1+ dependent pretibial edema  Respiratory:  clear to auscultation bilaterally, normal work of breathing GI: soft, nontender, mild distention,, + BS MS: no deformity or atrophy  Skin: warm and dry, no rash Neuro:  Strength and sensation are intact Psych: euthymic mood, full affect   EKG: Atrial fibrillation with slow ventricular response, heart rate 62 bpm.  (New A. fib)  Recent Labs: 03/30/2017: ALT 15 04/06/2017: BUN 20; Creatinine, Ser 1.08; Potassium 4.4; Sodium 141    Lipid Panel    Component Value Date/Time   CHOL 102 (L) 07/17/2016 1026   TRIG 160 (H) 07/17/2016 1026   HDL 34 (L) 07/17/2016 1026   CHOLHDL 3.0 07/17/2016 1026   VLDL 32 (H) 07/17/2016 1026   LDLCALC 36 07/17/2016 1026   LDLDIRECT 66 11/04/2013 1032      Wt Readings from Last 3 Encounters:  03/14/18 232 lb 9.6 oz (105.5 kg)  11/28/17 224 lb 9.6 oz (101.9 kg)  06/15/17 217 lb (98.4 kg)      Other studies Reviewed: Prior EKG revealing normal sinus rhythm with first-degree AV block.  ASSESSMENT AND PLAN:  1.  New onset atrial fibrillation: The patient has no prior history of atrial fibrillation.  The patient has a slow ventricular response with a rate of 60 bpm.  I suspect that atrial fibrillation is contributing to his overall energy level and dyspnea.  He also appears to have mild fluid overload.  I have explained atrial fibrillation to the patient and family members.  The patient will be placed on Eliquis 5 mg twice daily for prevention of CVA with CHADS VASc Score of 4. I have explained the risks and benefits of anticoagulation therapy.   I will have echocardiogram, TSH, BMET and CBC completed with close follow up with Dr. [name]. Will need to consider DCCV on  next visit if he continue symptomatic.   2. Hypertension: Multiple medication intolerances No changes in antihypertensive therapy.   3. CAD: Hx of stent to the LAD in 2005. If echo is abnormal, consider further ischemic testing.   4. Diabetes: Patient states that he is not adhering to diabetic diet. Will check Hgb A1c with labs.    Current medicines are reviewed at length with the patient today.    Labs/ tests ordered today include: Echo, BMET, CBC, TSH. Hgb A1c Colin Newman. Colin Newman,  Colin Newman, Colin Newman   03/14/2018 12:41 PM    [facility] [address] [place], [place] [address] Phone: [phone]; Fax: [phone]

## 2018-03-14 NOTE — Patient Instructions (Signed)
Medication Instructions:  START ENTRESTO 5MG  TWICE DAILY  START LASIX 20MG  DAILY  STOP ASPIRIN 81MG    If you need a refill on your cardiac medications before your next appointment, please call your pharmacy.  Labwork: BMET,TSH,CBC AND A1C TODAY HERE IN OUR OFFICE AT [facility]  Take the provided lab slips with you to the lab for your blood draw.   Testing/Procedures: Echocardiogram - Your physician has requested that you have an echocardiogram ASAP. Echocardiography is a painless test that uses sound waves to create images of your heart. It provides your doctor with information about the size and shape of your heart and how well your heart's chambers and valves are working. This procedure takes approximately one hour. There are no restrictions for this procedure. This will be performed at our [facility] location - [address].  Please call [facility] network for Physician referral at: [phone] -or- [email or web address]  Follow-Up: Your physician wants you to follow-up in: AFTER ECHO WITH DR [name] (1ST AVAILABLE)   Thank you for choosing [facility] at [facility]!!

## 2018-03-15 ENCOUNTER — Other Ambulatory Visit: Payer: Self-pay

## 2018-03-15 ENCOUNTER — Ambulatory Visit (HOSPITAL_COMMUNITY): Payer: Medicare Other | Attending: Cardiology

## 2018-03-15 DIAGNOSIS — R0600 Dyspnea, unspecified: Secondary | ICD-10-CM | POA: Diagnosis not present

## 2018-03-15 DIAGNOSIS — I440 Atrioventricular block, first degree: Secondary | ICD-10-CM | POA: Insufficient documentation

## 2018-03-15 DIAGNOSIS — R079 Chest pain, unspecified: Secondary | ICD-10-CM | POA: Diagnosis not present

## 2018-03-15 DIAGNOSIS — I2510 Atherosclerotic heart disease of native coronary artery without angina pectoris: Secondary | ICD-10-CM | POA: Diagnosis not present

## 2018-03-15 DIAGNOSIS — I519 Heart disease, unspecified: Secondary | ICD-10-CM | POA: Diagnosis not present

## 2018-03-15 DIAGNOSIS — I4891 Unspecified atrial fibrillation: Secondary | ICD-10-CM | POA: Diagnosis not present

## 2018-03-15 LAB — HEMOGLOBIN A1C
Est. average glucose Bld gHb Est-mCnc: 166 mg/dL
Hgb A1c MFr Bld: 7.4 % — ABNORMAL HIGH (ref 4.8–5.6)

## 2018-03-15 LAB — BASIC METABOLIC PANEL
BUN/Creatinine Ratio: 16 (ref 10–24)
BUN: 19 mg/dL (ref 8–27)
CHLORIDE: 103 mmol/L (ref 96–106)
CO2: 22 mmol/L (ref 20–29)
Calcium: 8.4 mg/dL — ABNORMAL LOW (ref 8.6–10.2)
Creatinine, Ser: 1.19 mg/dL (ref 0.76–1.27)
GFR, EST AFRICAN AMERICAN: 65 mL/min/{1.73_m2} (ref >59)
GFR, EST NON AFRICAN AMERICAN: 57 mL/min/{1.73_m2} — AB (ref >59)
Glucose: 148 mg/dL — ABNORMAL HIGH (ref 65–99)
POTASSIUM: 4.9 mmol/L (ref 3.5–5.2)
SODIUM: 142 mmol/L (ref 134–144)

## 2018-03-15 LAB — TSH: TSH: 3.79 u[IU]/mL (ref 0.450–4.500)

## 2018-03-15 LAB — CBC
Hematocrit: 32.5 % — ABNORMAL LOW (ref 37.5–51.0)
Hemoglobin: 10.8 g/dL — ABNORMAL LOW (ref 13.0–17.7)
MCH: 34.4 pg — AB (ref 26.6–33.0)
MCHC: 33.2 g/dL (ref 31.5–35.7)
MCV: 104 fL — ABNORMAL HIGH (ref 79–97)
PLATELETS: 193 10*3/uL (ref 150–450)
RBC: 3.14 x10E6/uL — ABNORMAL LOW (ref 4.14–5.80)
RDW: 15.5 % — ABNORMAL HIGH (ref 12.3–15.4)
WBC: 6.3 10*3/uL (ref 3.4–10.8)

## 2018-03-15 MED ORDER — PERFLUTREN LIPID MICROSPHERE
1.0000 mL | INTRAVENOUS | Status: AC | PRN
  Administered 2018-03-15: 2 mL via INTRAVENOUS

## 2018-03-18 ENCOUNTER — Telehealth: Payer: Self-pay

## 2018-03-18 DIAGNOSIS — D649 Anemia, unspecified: Secondary | ICD-10-CM

## 2018-03-18 NOTE — Telephone Encounter (Signed)
Spoke to patient I received a message to schedule you a follow up appointment with Dr.Jordan to review echo and lab.Advised Dr.Jordan's schedule is full.He advised ok to see Jory Sims DNP.He reviewed lab which revealed low hgb 10.8 hct 32.5.He advised to have stool checked for occult blood x 3.He will pick up stool containers this afternoon.Follow up appointment scheduled with Jory Sims DNP 03/27/18 at 11:00 am Follow up appointment scheduled with Dr.Jordan 07/04/18 at 10:00 am.

## 2018-03-22 ENCOUNTER — Other Ambulatory Visit: Payer: Self-pay

## 2018-03-22 DIAGNOSIS — I2510 Atherosclerotic heart disease of native coronary artery without angina pectoris: Secondary | ICD-10-CM

## 2018-03-26 DIAGNOSIS — R710 Precipitous drop in hematocrit: Secondary | ICD-10-CM | POA: Diagnosis not present

## 2018-03-26 DIAGNOSIS — D649 Anemia, unspecified: Secondary | ICD-10-CM | POA: Diagnosis not present

## 2018-03-26 NOTE — Progress Notes (Signed)
Cardiology Office Note   Date:  03/27/2018   ID:  Colin Newman, DOB 05/24/1933, MRN [id number]  PCP:  [name], MD  Cardiologist: Dr. [name]  Chief Complaint  Patient presents with  . Follow-up    discuss taking advil  . Atrial Fibrillation     History of Present Illness: Colin Newman is a 84 y.o. male who presents for ongoing assessment and management of coronary artery disease, history of a stent to the proximal LAD in 2005, marked first-degree AV block, hypertension, hyperlipidemia, and diabetes.  The patient is intolerant to multiple antihypertensives, to include (copy for accuracy):  Losartan - rash/angioedemia Lisinopril - itching HCTZ - itching Chlorthalidone - cost-prohibitive Hydralazine - caused him to be swimmy headed  He is currently on amlodipine and spironolactone.  When last seen in the office on 03/14/2018 the patient complained of worsening dyspnea on exertion, having to stop and rest frequently.  The patient had gained approximately 8 to 10 pounds, and had not been adhering to diabetic low-sodium diet.  On that office visit the patient was found to have new onset atrial fibrillation for EKG, and it was suspected that the atrial fibrillation was contributing to his overall symptoms and fluid retention.  The patient was placed on Eliquis 5 mg twice daily for prevention of CVA with a CHADS VASC Score of 4.  An echocardiogram was ordered along with a TSH, BMET, hemoglobin A1c and CBC.  Consideration for cardioversion on next visit will be discussed if he remained in atrial fibrillation after being on anticoagulation therapy for 3 weeks.  He was noted to have anemia with a hemoglobin of 10.8 and hematocrit of 32.5.  Hemoccult stool was ordered.  Echo 03/15/2018  revealed:  Normal LV systolic function with an EF of 60% to 65%, no evidence of thrombus, aortic valve noncoronary cusp mobility was mildly restricted, the left atrium was only mildly dilated, the  right atrium was only mildly dilated, and he was found to have moderately increased PA pressure of 51 mmHg.  Review of other labs were within normal limits with exception of mildly elevated creatinine of 1.19.  Hemoglobin A1c 7.4.  Past Medical History:  Diagnosis Date  . Constipation   . Diabetes mellitus type 2, noninsulin dependent ([place])       . Dyslipidemia    Hx with intolerance to statin therapy  . Hypertension   . Obesity   . Single vessel coronary disease    s/p stent LAD 2005    Past Surgical History:  Procedure Laterality Date  . BACK SURGERY     2 previous  . BILATERAL KNEE ARTHROSCOPY    . CARDIOVASCULAR STRESS TEST  07-20-2009   EF 73%  . OTHER SURGICAL HISTORY     Stent in the proximal left anterior descending in 2005     Current Outpatient Medications  Medication Sig Dispense Refill  . amLODipine (NORVASC) 5 MG tablet Take 1 tablet (5 mg total) by mouth daily. 90 tablet 3  . apixaban (ELIQUIS) 5 MG TABS tablet Take 1 tablet (5 mg total) by mouth 2 (two) times daily. 60 tablet 6  . atorvastatin (LIPITOR) 40 MG tablet Take 1 tablet (40 mg total) by mouth daily. 90 tablet 2  . furosemide (LASIX) 20 MG tablet Take 1 tablet (20 mg total) by mouth daily. 30 tablet 3  . glucose blood (ONE TOUCH ULTRA TEST) test strip Use for blood sugar checks once daily. 100 each 11  .  metFORMIN (GLUCOPHAGE) 1000 MG tablet Take 1 tablet (1,000 mg total) by mouth 2 (two) times daily. 180 tablet 0  . metoprolol tartrate (LOPRESSOR) 50 MG tablet Take 1 tablet (50 mg total) by mouth 2 (two) times daily. 180 tablet 2  . Multiple Vitamin (MULTI-VITAMIN DAILY PO) Take 1 tablet by mouth 1 day or 1 dose.    . nitroGLYCERIN (NITROSTAT) 0.4 MG SL tablet DISSOLVE ONE TABLET UNDER THE TONGUE EVERY 5 MINUTES AS NEEDED FOR CHEST PAIN.  DO NOT EXCEED A TOTAL OF 3 DOSES IN 15 MINUTES 10 tablet 0  . Omega-3 Fatty Acids (FISH OIL) 1200 MG CAPS Take 1 capsule by mouth daily.      Glory Rosebush DELICA  LANCETS 14E MISC Use to check blood sugar once daily. 100 each 12  . spironolactone (ALDACTONE) 25 MG tablet TAKE 1 TABLET BY MOUTH ONCE DAILY 90 tablet 1   No current facility-administered medications for this visit.     Allergies:   Lisinopril; Losartan; and Hctz [hydrochlorothiazide]    Social History:  The patient  reports that he has quit smoking. His smokeless tobacco use includes chew. He reports that he does not drink alcohol.   Family History:  The patient's family history includes Cancer in his father; Pulmonary embolism in his mother.    ROS: All other systems are reviewed and negative. Unless otherwise mentioned in H&P    PHYSICAL EXAM: VS:  BP 138/62   Pulse 61   Ht 5\' 11"  (1.803 m)   Wt 228 lb (103.4 kg)   BMI 31.80 kg/m  , BMI Body mass index is 31.8 kg/m. GEN: Well nourished, well developed, in no acute distress  HEENT: normal  Neck: no JVD, carotid bruits, or masses Cardiac: IRRR; no murmurs, rubs, or gallops,no edema  Respiratory:  clear to auscultation bilaterally, normal work of breathing GI: soft, nontender, nondistended, + BS MS: no deformity or atrophy  Skin: warm and dry, no rash Neuro:  Strength and sensation are intact Psych: euthymic mood, full affect   EKG:  Atrial fibrillation with slow ventricular rate 53 bpm. Recent Labs: 03/30/2017: ALT 15 03/14/2018: BUN 19; Creatinine, Ser 1.19; Hemoglobin 10.8; Platelets 193; Potassium 4.9; Sodium 142; TSH 3.790    Lipid Panel    Component Value Date/Time   CHOL 102 (L) 07/17/2016 1026   TRIG 160 (H) 07/17/2016 1026   HDL 34 (L) 07/17/2016 1026   CHOLHDL 3.0 07/17/2016 1026   VLDL 32 (H) 07/17/2016 1026   LDLCALC 36 07/17/2016 1026   LDLDIRECT 66 11/04/2013 1032      Wt Readings from Last 3 Encounters:  03/27/18 228 lb (103.4 kg)  03/14/18 232 lb 9.6 oz (105.5 kg)  11/28/17 224 lb 9.6 oz (101.9 kg)      Other studies Reviewed: NM Stress Test August 12, 2012  Normal per Dr [name]  note  ASSESSMENT AND PLAN:  1.  Atrial fib: Rate is controlled currently with metoprolol 50 mg twice daily, he is continued on apixaban 5 mg twice daily.  I am awaiting stool Hemoccult to evaluate need and safety of continuing as he was also found to be anemic on recent blood work.  For now we will continue him on anticoagulation therapy until we have reasons to stop it.  He denies any melena, hemoptysis, or hematuria. CHADS Vasc Score of 4.  Can continue to consider cardioversion however will need to have underlying evaluation of anemia first order to help maintain normal sinus rhythm.  Echo revealed  mildly dilated left atrium.  2.  Anemia: Anemia profile will be drawn today in anticipation of need for referral to hematology versus GI work-up if his Hemoccult is positive.  I have explained all this to the patient and his family who verbalized understanding.  Stool Hemoccult will assist in further management and referrals if necessary.  If we are able to increase his hemoglobin he may convert to normal sinus rhythm on his own or maintain normal sinus rhythm after cardioversion if this becomes necessary.  3.  Hypertension: Blood pressure is better controlled.  Addition of Lasix has helped with lower extremity edema.  Echocardiogram has been reviewed as above.  No changes in regimen at this time.  Anemia   Current medicines are reviewed at length with the patient today.    Labs/ tests ordered today include: Anemia profile   Phill Myron. West Pugh, ANP, AACC   03/27/2018 12:55 PM    [facility] [address], [place], [place] [address] Phone: [phone]; Fax: [phone]

## 2018-03-27 ENCOUNTER — Ambulatory Visit (INDEPENDENT_AMBULATORY_CARE_PROVIDER_SITE_OTHER): Payer: Medicare Other | Admitting: Adult Health

## 2018-03-27 ENCOUNTER — Encounter: Payer: Self-pay | Admitting: Adult Health

## 2018-03-27 VITALS — BP 138/62 | HR 61 | Ht 71.0 in | Wt 228.0 lb

## 2018-03-27 DIAGNOSIS — I10 Essential (primary) hypertension: Secondary | ICD-10-CM

## 2018-03-27 DIAGNOSIS — D649 Anemia, unspecified: Secondary | ICD-10-CM

## 2018-03-27 DIAGNOSIS — I4891 Unspecified atrial fibrillation: Secondary | ICD-10-CM | POA: Diagnosis not present

## 2018-03-27 DIAGNOSIS — I2510 Atherosclerotic heart disease of native coronary artery without angina pectoris: Secondary | ICD-10-CM

## 2018-03-27 DIAGNOSIS — R195 Other fecal abnormalities: Secondary | ICD-10-CM | POA: Diagnosis not present

## 2018-03-27 DIAGNOSIS — Z79899 Other long term (current) drug therapy: Secondary | ICD-10-CM | POA: Diagnosis not present

## 2018-03-27 NOTE — Patient Instructions (Signed)
Medication Instructions:  NO CHANGES- Your physician recommends that you continue on your current medications as directed. Please refer to the Current Medication list given to you today.  If you need a refill on your cardiac medications before your next appointment, please call your pharmacy.  Please call [facility] network for Physician referral at: [phone] -or- [email or web address] [facility]    Thank you for choosing [facility] at [facility]!!

## 2018-03-28 ENCOUNTER — Other Ambulatory Visit: Payer: Self-pay

## 2018-03-28 DIAGNOSIS — R195 Other fecal abnormalities: Secondary | ICD-10-CM

## 2018-03-28 DIAGNOSIS — D649 Anemia, unspecified: Secondary | ICD-10-CM

## 2018-03-28 LAB — ANEMIA PROFILE B
BASOS ABS: 0 10*3/uL (ref 0.0–0.2)
Basos: 0 %
EOS (ABSOLUTE): 0.2 10*3/uL (ref 0.0–0.4)
Eos: 3 %
Ferritin: 106 ng/mL (ref 30–400)
Folate: 12.5 ng/mL (ref >3.0)
Hematocrit: 31.8 % — ABNORMAL LOW (ref 37.5–51.0)
Hemoglobin: 10.6 g/dL — ABNORMAL LOW (ref 13.0–17.7)
IMMATURE GRANS (ABS): 0 10*3/uL (ref 0.0–0.1)
Immature Granulocytes: 0 %
Iron Saturation: 17 % (ref 15–55)
Iron: 44 ug/dL (ref 38–169)
LYMPHS: 15 %
Lymphocytes Absolute: 1 10*3/uL (ref 0.7–3.1)
MCH: 34.1 pg — ABNORMAL HIGH (ref 26.6–33.0)
MCHC: 33.3 g/dL (ref 31.5–35.7)
MCV: 102 fL — ABNORMAL HIGH (ref 79–97)
MONOCYTES: 12 %
MONOS ABS: 0.8 10*3/uL (ref 0.1–0.9)
Neutrophils Absolute: 4.8 10*3/uL (ref 1.4–7.0)
Neutrophils: 70 %
PLATELETS: 215 10*3/uL (ref 150–450)
RBC: 3.11 x10E6/uL — AB (ref 4.14–5.80)
RDW: 15.6 % — AB (ref 12.3–15.4)
RETIC CT PCT: 2.3 % (ref 0.6–2.6)
Total Iron Binding Capacity: 257 ug/dL (ref 250–450)
UIBC: 213 ug/dL (ref 111–343)
VITAMIN B 12: 239 pg/mL (ref 232–1245)
WBC: 6.9 10*3/uL (ref 3.4–10.8)

## 2018-03-28 LAB — FECAL OCCULT BLOOD, IMMUNOCHEMICAL
FECAL OCCULT BLD: POSITIVE — AB
Fecal Occult Bld: POSITIVE — AB

## 2018-03-28 NOTE — Progress Notes (Signed)
amb  

## 2018-03-29 LAB — FECAL OCCULT BLOOD, IMMUNOCHEMICAL: Fecal Occult Bld: POSITIVE — AB

## 2018-04-02 ENCOUNTER — Telehealth: Payer: Self-pay | Admitting: *Deleted

## 2018-04-02 DIAGNOSIS — K5900 Constipation, unspecified: Secondary | ICD-10-CM | POA: Diagnosis not present

## 2018-04-02 DIAGNOSIS — R195 Other fecal abnormalities: Secondary | ICD-10-CM | POA: Diagnosis not present

## 2018-04-02 DIAGNOSIS — D509 Iron deficiency anemia, unspecified: Secondary | ICD-10-CM | POA: Diagnosis not present

## 2018-04-02 DIAGNOSIS — Z86010 Personal history of colonic polyps: Secondary | ICD-10-CM | POA: Diagnosis not present

## 2018-04-02 NOTE — Telephone Encounter (Signed)
   [facility] Pre-operative Risk Assessment    Request for surgical clearance:  1. What type of surgery is being performed? EGD  2. When is this surgery scheduled? 04/08/18  3. What type of clearance is required (medical clearance vs. Pharmacy clearance to hold med vs. Both)?MEDICAL  4. Are there any medications that need to be held prior to surgery and how long?  5. Practice name and name of physician performing surgery? [facility]  6. What is your office phone number [phone]   7.   What is your office fax number [phone]   8.   Anesthesia type (None, local, MAC, general) ? PROPOFOL   Colin Newman 04/02/2018, 4:36 PM  _________________________________________________________________   (provider comments below)

## 2018-04-03 NOTE — Telephone Encounter (Signed)
Spoke with requesting provider and they stated that pt needs to hold Eliquis 5 days prior to procedure

## 2018-04-03 NOTE — Telephone Encounter (Signed)
   Primary Cardiologist: Peter [name], MD  Chart reviewed as part of pre-operative protocol coverage. Given past medical history and time since last visit, based on ACC/AHA guidelines, AZLAAN ISIDORE would be at acceptable risk for the planned procedure without further cardiovascular testing.   According to our pharmacy protocol:  Eliquis will clear from the body within 1-2 days, it should not need to be held for 5 days prior to any procedure like warfarin does. Pt takes Eliquis for afib with CHADS2VASc score of 5 (age x2, HTN, DM, CAD). CrCl is 74mL/min. Recommend holding Eliquis for 1-2 days prior to EGD per protocol.         I will route this recommendation to the requesting party via Epic fax function and remove from pre-op pool.  Please call with questions.  Daune Perch, NP 04/03/2018, 4:06 PM

## 2018-04-03 NOTE — Telephone Encounter (Signed)
Eliquis will clear from the body within 1-2 days, it should not need to be held for 5 days prior to any procedure like warfarin does. Pt takes Eliquis for afib with CHADS2VASc score of 5 (age x2, HTN, DM, CAD). CrCl is 71mL/min. Recommend holding Eliquis for 1-2 days prior to EGD per protocol.

## 2018-04-03 NOTE — Telephone Encounter (Signed)
Pre-op callback, please find out if Eliquis needs to be held for this procedure.

## 2018-04-08 ENCOUNTER — Other Ambulatory Visit: Payer: Self-pay | Admitting: Gastroenterology

## 2018-04-08 ENCOUNTER — Ambulatory Visit (INDEPENDENT_AMBULATORY_CARE_PROVIDER_SITE_OTHER): Payer: Medicare Other | Admitting: Family

## 2018-04-08 ENCOUNTER — Encounter: Payer: Self-pay | Admitting: Family

## 2018-04-08 VITALS — BP 148/72 | HR 66 | Ht 71.0 in | Wt 227.0 lb

## 2018-04-08 DIAGNOSIS — E1169 Type 2 diabetes mellitus with other specified complication: Secondary | ICD-10-CM

## 2018-04-08 DIAGNOSIS — I4891 Unspecified atrial fibrillation: Secondary | ICD-10-CM | POA: Diagnosis not present

## 2018-04-08 MED ORDER — METFORMIN HCL 1000 MG PO TABS: 1000.0000 mg | ORAL_TABLET | Freq: Two times a day (BID) | ORAL | 1 refills | Status: DC

## 2018-04-08 NOTE — Progress Notes (Signed)
Colin Newman is a 84 y.o. male with the following history as recorded in EpicCare:  Patient Active Problem List   Diagnosis Date Noted  . Colon polyps 04/12/2017  . Nephropathy 11/10/2016  . Mobitz type 1 second degree atrioventricular block 02/07/2016  . Pre-ulcerative calluses 03/16/2015  . Right foot drop 03/16/2015  . CAD (coronary artery disease), native coronary artery 09/17/2014  . Eustachian tube dysfunction 10/28/2013  . Hearing loss 10/28/2013  . Obesity (BMI 30.0-34.9) 08/30/2012  . Chest wall pain 08/21/2012  . Drug rash 11/21/2011  . Dyslipidemia 07/26/2011  . Type 2 diabetes mellitus ([place]) 06/23/2011  . ACTINIC KERATOSIS 06/11/2008  . ERECTILE DYSFUNCTION 05/21/2008  . CORONARY ARTERY DISEASE, S/P PTCA 02/06/2008  . Essential hypertension 12/13/2006  . HEMORRHOIDS, NOS 12/13/2006  . BPH 12/13/2006  . OSTEOARTHRITIS, LOWER LEG 12/13/2006  . BACK PAIN, LOW 12/13/2006    Current Outpatient Medications  Medication Sig Dispense Refill  . amLODipine (NORVASC) 5 MG tablet Take 1 tablet (5 mg total) by mouth daily. 90 tablet 3  . atorvastatin (LIPITOR) 40 MG tablet Take 1 tablet (40 mg total) by mouth daily. 90 tablet 2  . furosemide (LASIX) 20 MG tablet Take 1 tablet (20 mg total) by mouth daily. 30 tablet 3  . glucose blood (ONE TOUCH ULTRA TEST) test strip Use for blood sugar checks once daily. 100 each 11  . metFORMIN (GLUCOPHAGE) 1000 MG tablet Take 1 tablet (1,000 mg total) by mouth 2 (two) times daily. 180 tablet 1  . metoprolol tartrate (LOPRESSOR) 50 MG tablet Take 1 tablet (50 mg total) by mouth 2 (two) times daily. 180 tablet 2  . Multiple Vitamin (MULTI-VITAMIN DAILY PO) Take 1 tablet by mouth 1 day or 1 dose.    . nitroGLYCERIN (NITROSTAT) 0.4 MG SL tablet DISSOLVE ONE TABLET UNDER THE TONGUE EVERY 5 MINUTES AS NEEDED FOR CHEST PAIN.  DO NOT EXCEED A TOTAL OF 3 DOSES IN 15 MINUTES 10 tablet 0  . Omega-3 Fatty Acids (FISH OIL) 1200 MG CAPS Take 1 capsule by  mouth daily.      Glory Rosebush DELICA LANCETS 40H MISC Use to check blood sugar once daily. 100 each 12  . spironolactone (ALDACTONE) 25 MG tablet TAKE 1 TABLET BY MOUTH ONCE DAILY 90 tablet 1  . apixaban (ELIQUIS) 5 MG TABS tablet Take 1 tablet (5 mg total) by mouth 2 (two) times daily. (Patient not taking: Reported on 04/08/2018) 60 tablet 6   No current facility-administered medications for this visit.     Allergies: Lisinopril; Losartan; and Hctz [hydrochlorothiazide]  Past Medical History:  Diagnosis Date  . Constipation   . Diabetes mellitus type 2, noninsulin dependent ([place])       . Dyslipidemia    Hx with intolerance to statin therapy  . Hypertension   . Obesity   . Single vessel coronary disease    s/p stent LAD 2005    Past Surgical History:  Procedure Laterality Date  . BACK SURGERY     2 previous  . BILATERAL KNEE ARTHROSCOPY    . CARDIOVASCULAR STRESS TEST  07-20-2009   EF 73%  . CORONARY ANGIOPLASTY WITH STENT PLACEMENT  2005  . FINGER SURGERY Right 2018   right 5th finger  . OTHER SURGICAL HISTORY     Stent in the proximal left anterior descending in 2005    Family History  Problem Relation Age of Onset  . Pulmonary embolism Mother   . Cancer Father  Social History   Tobacco Use  . Smoking status: Former Research scientist (life sciences)  . Smokeless tobacco: Current User    Types: Chew  Substance Use Topics  . Alcohol use: No    Alcohol/week: 0.0 oz    Comment: Rarely has a beer    Subjective:  Patient presents today as a new patient/ accompanied by son and daughter-in-law; has not been under care of primary care provider for many years- has been using urgent care for majority of his needs; was recently found to be in A. Fib and now under care of [facility]; referred here to follow-up on Type 2 Diabetes- last Hgba1c at end of May was 7.4- currently taking Metformin bid; Scheduled for endoscopy today due to worsening hemoglobin readings noted after being on Eliquis for a  few weeks; need to determine if source of blood loss;  Admits that has been feeling dizzy/ tired/ difficulty breathing for a number of months- better with addition of Lasix; wants to just "get my heart shocked back into rhythm."     Objective:  Vitals:   04/08/18 1044  BP: (!) 148/72  Pulse: 66  SpO2: 98%  Weight: 227 lb (103 kg)  Height: 5\' 11"  (1.803 m)    General: Well developed, well nourished, in no acute distress  Skin : Warm and dry.  Head: Normocephalic and atraumatic  Lungs: Respirations unlabored; clear to auscultation bilaterally without wheeze, rales, rhonchi  CVS exam: normal rate, regular rhythm, normal S1, S2, no murmurs, rubs, clicks or gallops, irregularly irregular rhythm with rate 66.  Neurologic: Alert and oriented; speech intact; face symmetrical; moves all extremities well; CNII-XII intact without focal deficit   Assessment:  1. Type 2 diabetes mellitus with other specified complication, unspecified whether long term insulin use ([place])   2. Atrial fibrillation, unspecified type ([place])     Plan:  1. Reviewed recent Hgba1c- feel stable at 7.4; will not make any medication changes at this time; refill updated on Metformin; 2. Keep planned follow-up with cardiology once endoscopy completed today; reassurance that they will be better able to manage the condition for him once source of blood loss determined.    Return in about 5 months (around 09/08/2018) for AWV with Sharee Pimple and follow-up me right after.  No orders of the defined types were placed in this encounter.   Requested Prescriptions   Signed Prescriptions Disp Refills  . metFORMIN (GLUCOPHAGE) 1000 MG tablet 180 tablet 1    Sig: Take 1 tablet (1,000 mg total) by mouth 2 (two) times daily.

## 2018-04-09 ENCOUNTER — Encounter: Payer: Self-pay | Admitting: Adult Health

## 2018-04-09 ENCOUNTER — Other Ambulatory Visit: Payer: Self-pay

## 2018-04-09 ENCOUNTER — Ambulatory Visit (HOSPITAL_COMMUNITY)
Admission: RE | Admit: 2018-04-09 | Discharge: 2018-04-09 | Disposition: A | Discharge status: Home / Self Care | Payer: Medicare Other | Source: Ambulatory Visit | Attending: Gastroenterology | Admitting: Gastroenterology

## 2018-04-09 ENCOUNTER — Encounter (HOSPITAL_COMMUNITY)
Admission: RE | Disposition: A | Discharge status: Home / Self Care | Payer: Self-pay | Source: Ambulatory Visit | Attending: Gastroenterology

## 2018-04-09 ENCOUNTER — Encounter (HOSPITAL_COMMUNITY): Payer: Self-pay

## 2018-04-09 DIAGNOSIS — I4891 Unspecified atrial fibrillation: Secondary | ICD-10-CM | POA: Diagnosis not present

## 2018-04-09 DIAGNOSIS — F17220 Nicotine dependence, chewing tobacco, uncomplicated: Secondary | ICD-10-CM | POA: Insufficient documentation

## 2018-04-09 DIAGNOSIS — E785 Hyperlipidemia, unspecified: Secondary | ICD-10-CM | POA: Insufficient documentation

## 2018-04-09 DIAGNOSIS — E119 Type 2 diabetes mellitus without complications: Secondary | ICD-10-CM | POA: Insufficient documentation

## 2018-04-09 DIAGNOSIS — K31811 Angiodysplasia of stomach and duodenum with bleeding: Secondary | ICD-10-CM | POA: Diagnosis not present

## 2018-04-09 DIAGNOSIS — Z888 Allergy status to other drugs, medicaments and biological substances status: Secondary | ICD-10-CM | POA: Diagnosis not present

## 2018-04-09 DIAGNOSIS — K210 Gastro-esophageal reflux disease with esophagitis: Secondary | ICD-10-CM | POA: Insufficient documentation

## 2018-04-09 DIAGNOSIS — Z955 Presence of coronary angioplasty implant and graft: Secondary | ICD-10-CM | POA: Insufficient documentation

## 2018-04-09 DIAGNOSIS — Z7902 Long term (current) use of antithrombotics/antiplatelets: Secondary | ICD-10-CM | POA: Diagnosis not present

## 2018-04-09 DIAGNOSIS — Z7984 Long term (current) use of oral hypoglycemic drugs: Secondary | ICD-10-CM | POA: Diagnosis not present

## 2018-04-09 DIAGNOSIS — D509 Iron deficiency anemia, unspecified: Secondary | ICD-10-CM | POA: Insufficient documentation

## 2018-04-09 DIAGNOSIS — I10 Essential (primary) hypertension: Secondary | ICD-10-CM | POA: Insufficient documentation

## 2018-04-09 DIAGNOSIS — I2510 Atherosclerotic heart disease of native coronary artery without angina pectoris: Secondary | ICD-10-CM | POA: Insufficient documentation

## 2018-04-09 DIAGNOSIS — K209 Esophagitis, unspecified: Secondary | ICD-10-CM | POA: Diagnosis not present

## 2018-04-09 DIAGNOSIS — Z79899 Other long term (current) drug therapy: Secondary | ICD-10-CM | POA: Insufficient documentation

## 2018-04-09 HISTORY — PX: HOT HEMOSTASIS: SHX5433

## 2018-04-09 HISTORY — PX: ESOPHAGOGASTRODUODENOSCOPY: SHX5428

## 2018-04-09 LAB — GLUCOSE, CAPILLARY: Glucose-Capillary: 154 mg/dL — ABNORMAL HIGH (ref 70–99)

## 2018-04-09 SURGERY — EGD (ESOPHAGOGASTRODUODENOSCOPY)
Anesthesia: Moderate Sedation

## 2018-04-09 MED ORDER — FENTANYL CITRATE (PF) 100 MCG/2ML IJ SOLN
INTRAMUSCULAR | Status: DC | PRN
  Administered 2018-04-09 (×2): 12.5 ug via INTRAVENOUS
  Administered 2018-04-09: 25 ug via INTRAVENOUS

## 2018-04-09 MED ORDER — FENTANYL CITRATE (PF) 100 MCG/2ML IJ SOLN
INTRAMUSCULAR | Status: AC
Start: 2018-04-09 — End: ?
  Filled 2018-04-09: qty 2

## 2018-04-09 MED ORDER — LACTATED RINGERS IV SOLN
INTRAVENOUS | Status: DC
  Administered 2018-04-09: 14:00:00 via INTRAVENOUS

## 2018-04-09 MED ORDER — MIDAZOLAM HCL 5 MG/ML IJ SOLN
INTRAMUSCULAR | Status: AC
  Filled 2018-04-09: qty 2

## 2018-04-09 MED ORDER — SODIUM CHLORIDE 0.9 % IV SOLN: INTRAVENOUS | Status: DC

## 2018-04-09 MED ORDER — MIDAZOLAM HCL 10 MG/2ML IJ SOLN
INTRAMUSCULAR | Status: DC | PRN
  Administered 2018-04-09 (×5): 1 mg via INTRAVENOUS

## 2018-04-09 NOTE — Discharge Instructions (Signed)

## 2018-04-09 NOTE — Op Note (Signed)
[facility] Patient Name: Colin Newman Procedure Date: 04/09/2018 MRN: [id number] Attending MD: [name] , MD Date of Birth: 1933/05/07 CSN: [id number] Age: 84 Admit Type: Outpatient Procedure:                Upper GI endoscopy Indications:              Iron deficiency anemia Providers:                [name], MD, Cleda Daub, RN, Elspeth Cho                            Tech., Technician Referring MD:              Medicines:                Midazolam 5 mg IV, Fentanyl 50 micrograms IV Complications:            No immediate complications. Estimated Blood Loss:     Estimated blood loss: none. Procedure:                Pre-Anesthesia Assessment:                           - Prior to the procedure, a History and Physical                            was performed, and patient medications and                            allergies were reviewed. The patient's tolerance of                            previous anesthesia was also reviewed. The risks                            and benefits of the procedure and the sedation                            options and risks were discussed with the patient.                            All questions were answered, and informed consent                            was obtained. Prior Anticoagulants: The patient has                            taken no previous anticoagulant or antiplatelet                            agents. ASA Grade Assessment: III - A patient with                            severe systemic disease. After reviewing the risks  and benefits, the patient was deemed in                            satisfactory condition to undergo the procedure.                           - Sedation was administered by an endoscopy nurse.                            The sedation level attained was moderate.                           After obtaining informed consent, the endoscope was   passed under direct vision. Throughout the                            procedure, the patient's blood pressure, pulse, and                            oxygen saturations were monitored continuously. The                            EC-[id number] ([id number]) scope was introduced through                            the mouth, and advanced to the proximal jejunum.                            The upper GI endoscopy was accomplished without                            difficulty. The patient tolerated the procedure                            well. Scope In: Scope Out: Findings:      LA Grade A (one or more mucosal breaks less than 5 mm, not extending       between tops of 2 mucosal folds) esophagitis with bleeding was found.      Multiple 1 mm stigmata of recent bleeding angioectasias were found in       the gastric antrum. Coagulation for hemostasis using monopolar probe was       successful. Estimated blood loss: none.      A few 1 mm angiodysplastic lesions without bleeding were found in the       second portion of the duodenum. Coagulation for bleeding prevention       using monopolar probe was successful. Estimated blood loss: none.      Multiple 1 mm angiodysplastic lesions without bleeding were found in the       jejunum. Coagulation for bleeding prevention using monopolar probe was       successful.      There was a medium-sized lipoma in the jejunum.      Initial inspection of the Z-line was sharp and normal, but at the end of       the procedure there was evidence of friability in that area. In the  gastric antrum there was evidence of hematin, but no overt bleeding.       Suspicious punctate atypical appearing AVMs were ablated with APC. In       the duodenum and the jejunum there was evidence of a few typical AVMs,       but the majority were atypical AVMs. All lesions and suspicious lesions       were ablated with APC. Impression:               - LA Grade A esophagitis.                            - Multiple recently bleeding angioectasias in the                            stomach. Treated with a monopolar probe.                           - A few non-bleeding angiodysplastic lesions in the                            duodenum. Treated with a monopolar probe.                           - Multiple non-bleeding angiodysplastic lesions in                            the jejunum. Treated with a monopolar probe.                           - No specimens collected. Moderate Sedation:      Moderate (conscious) sedation was administered by the endoscopy nurse       and supervised by the endoscopist. The following parameters were       monitored: oxygen saturation, heart rate, blood pressure, and response       to care. Recommendation:           - Patient has a contact number available for                            emergencies. The signs and symptoms of potential                            delayed complications were discussed with the                            patient. Return to normal activities tomorrow.                            Written discharge instructions were provided to the                            patient.                           - Resume previous diet.                           -  Continue present medications.                           - Return to GI clinic in 4 weeks.                           - Hold any anticoagulation for 3 more days and then                            resume anticoagulation per cardiology. Procedure Code(s):        --- Professional ---                           [phone], Esophagogastroduodenoscopy, flexible,                            transoral; with control of bleeding, any method Diagnosis Code(s):        --- Professional ---                           K20.9, Esophagitis, unspecified                           K31.811, Angiodysplasia of stomach and duodenum                            with bleeding                           K55.20, Angiodysplasia of  colon without hemorrhage                           D50.9, Iron deficiency anemia, unspecified CPT copyright 2017 [employer]. All rights reserved. The codes documented in this report are preliminary and upon coder review may  be revised to meet current compliance requirements. [name], MD [name], MD 04/09/2018 3:44:39 PM This report has been signed electronically. Number of Addenda: 0

## 2018-04-09 NOTE — H&P (Signed)
  Colin Newman HPI: This is an 84 year old male with findings of heme positive stool and anemia.  HGB on 03/27/2018 was at 10.6 g/dL.  The patient stopped taking Eliquis for his afib on 03/29/2018.  His most recent colonoscopy was on 04/11/2017 with findings of a tubular adenoma and a sessile serrated adenoma in the ascending colon.  Past Medical History:  Diagnosis Date  . Constipation   . Diabetes mellitus type 2, noninsulin dependent ([place])       . Dyslipidemia    Hx with intolerance to statin therapy  . Hypertension   . Obesity   . Single vessel coronary disease    s/p stent LAD 2005    Past Surgical History:  Procedure Laterality Date  . BACK SURGERY     2 previous  . BILATERAL KNEE ARTHROSCOPY    . CARDIOVASCULAR STRESS TEST  07-20-2009   EF 73%  . CORONARY ANGIOPLASTY WITH STENT PLACEMENT  2005  . FINGER SURGERY Right 2018   right 5th finger  . OTHER SURGICAL HISTORY     Stent in the proximal left anterior descending in 2005    Family History  Problem Relation Age of Onset  . Pulmonary embolism Mother   . Cancer Father     Social History:  reports that he has quit smoking. His smokeless tobacco use includes chew. He reports that he does not drink alcohol or use drugs.  Allergies:  Allergies  Allergen Reactions  . Lisinopril Hives, Itching and Cough  . Losartan Hives and Other (See Comments)    Hives, itching, swelling of face and tongue associated w/ taking this medication.  [name] Hctz [Hydrochlorothiazide] Itching    Medications:  Scheduled:  Continuous: . sodium chloride    . lactated ringers 10 mL/hr at 04/09/18 1340    Results for orders placed or performed during the hospital encounter of 04/09/18 (from the past 24 hour(s))  Glucose, capillary     Status: Abnormal   Collection Time: 04/09/18  1:43 PM  Result Value Ref Range   Glucose-Capillary 154 (H) 70 - 99 mg/dL     No results found.  ROS:  As stated above in the HPI otherwise  negative.  Blood pressure (!) 145/77, pulse (!) 50, temperature 97.8 F (36.6 C), temperature source Oral, resp. rate 18, SpO2 100 %.    PE: Gen: NAD, Alert and Oriented HEENT:  [place]/AT, EOMI Neck: Supple, no LAD Lungs: CTA Bilaterally CV: Irreg, Irreg ABM: Soft, NTND, +BS Ext: No C/C/E  Assessment/Plan: 1) Anemia. 2) Heme positive stool.  Plan: 1) Enteroscopy.  Erian Lariviere D 04/09/2018, 2:57 PM

## 2018-04-10 ENCOUNTER — Encounter (HOSPITAL_COMMUNITY): Payer: Self-pay | Admitting: Gastroenterology

## 2018-04-10 ENCOUNTER — Encounter: Payer: Self-pay | Admitting: Adult Health

## 2018-04-10 NOTE — Telephone Encounter (Signed)
MD responded to patient:   Dr. [name] felt you could resume Eliquis 3 days after procedure if no complications. I would not take ASA.   Peter [name] MD, [facility]

## 2018-04-11 ENCOUNTER — Encounter: Payer: Self-pay | Admitting: Adult Health

## 2018-04-12 ENCOUNTER — Encounter: Payer: Self-pay | Admitting: Adult Health

## 2018-04-12 ENCOUNTER — Encounter: Payer: Self-pay | Admitting: Cardiology

## 2018-04-15 ENCOUNTER — Ambulatory Visit (INDEPENDENT_AMBULATORY_CARE_PROVIDER_SITE_OTHER): Payer: Medicare Other | Admitting: Pharmacist Clinician (PhC)/ Clinical Pharmacy Specialist

## 2018-04-15 DIAGNOSIS — Z7901 Long term (current) use of anticoagulants: Secondary | ICD-10-CM

## 2018-04-15 DIAGNOSIS — I4891 Unspecified atrial fibrillation: Secondary | ICD-10-CM

## 2018-04-15 MED ORDER — WARFARIN SODIUM 5 MG PO TABS: ORAL_TABLET | ORAL | 3 refills | Status: DC

## 2018-04-15 NOTE — Patient Instructions (Signed)
Description   Continue your Eliquis twice daily with the last dose on Wednesday evening.  Start warfarin 5 mg once daily in the evenings, starting today.

## 2018-04-19 ENCOUNTER — Ambulatory Visit (INDEPENDENT_AMBULATORY_CARE_PROVIDER_SITE_OTHER): Payer: Medicare Other | Admitting: Pharmacist Clinician (PhC)/ Clinical Pharmacy Specialist

## 2018-04-19 DIAGNOSIS — Z7901 Long term (current) use of anticoagulants: Secondary | ICD-10-CM | POA: Diagnosis not present

## 2018-04-19 DIAGNOSIS — I4891 Unspecified atrial fibrillation: Secondary | ICD-10-CM

## 2018-04-19 LAB — POCT INR: INR: 1.7 — AB (ref 2.0–3.0)

## 2018-04-25 ENCOUNTER — Ambulatory Visit (INDEPENDENT_AMBULATORY_CARE_PROVIDER_SITE_OTHER): Payer: Medicare Other | Admitting: *Deleted

## 2018-04-25 DIAGNOSIS — Z7901 Long term (current) use of anticoagulants: Secondary | ICD-10-CM | POA: Diagnosis not present

## 2018-04-25 DIAGNOSIS — I4891 Unspecified atrial fibrillation: Secondary | ICD-10-CM

## 2018-04-25 LAB — POCT INR: INR: 1.9 — AB (ref 2.0–3.0)

## 2018-04-25 NOTE — Patient Instructions (Signed)
Description   Change your dose to 1 tablet daily except 1/2 tablet on Wednesdays.  Repeat INR in 1 week

## 2018-05-02 ENCOUNTER — Other Ambulatory Visit: Payer: Self-pay

## 2018-05-02 DIAGNOSIS — Z79899 Other long term (current) drug therapy: Secondary | ICD-10-CM

## 2018-05-03 ENCOUNTER — Ambulatory Visit (INDEPENDENT_AMBULATORY_CARE_PROVIDER_SITE_OTHER): Payer: Medicare Other | Admitting: Pharmacist

## 2018-05-03 DIAGNOSIS — Z7901 Long term (current) use of anticoagulants: Secondary | ICD-10-CM

## 2018-05-03 DIAGNOSIS — I4891 Unspecified atrial fibrillation: Secondary | ICD-10-CM | POA: Diagnosis not present

## 2018-05-03 LAB — POCT INR: INR: 2.8 (ref 2.0–3.0)

## 2018-05-15 ENCOUNTER — Ambulatory Visit (INDEPENDENT_AMBULATORY_CARE_PROVIDER_SITE_OTHER): Payer: Medicare Other | Admitting: Pharmacist

## 2018-05-15 DIAGNOSIS — I4891 Unspecified atrial fibrillation: Secondary | ICD-10-CM | POA: Diagnosis not present

## 2018-05-15 DIAGNOSIS — Z7901 Long term (current) use of anticoagulants: Secondary | ICD-10-CM | POA: Diagnosis not present

## 2018-05-15 LAB — POCT INR: INR: 2.4 (ref 2.0–3.0)

## 2018-05-29 ENCOUNTER — Ambulatory Visit (INDEPENDENT_AMBULATORY_CARE_PROVIDER_SITE_OTHER): Payer: Medicare Other | Admitting: Pharmacist

## 2018-05-29 DIAGNOSIS — Z7901 Long term (current) use of anticoagulants: Secondary | ICD-10-CM | POA: Diagnosis not present

## 2018-05-29 DIAGNOSIS — I4891 Unspecified atrial fibrillation: Secondary | ICD-10-CM | POA: Diagnosis not present

## 2018-05-29 LAB — POCT INR: INR: 2.9 (ref 2.0–3.0)

## 2018-06-12 ENCOUNTER — Ambulatory Visit (INDEPENDENT_AMBULATORY_CARE_PROVIDER_SITE_OTHER): Payer: Medicare Other | Admitting: Pharmacist Clinician (PhC)/ Clinical Pharmacy Specialist

## 2018-06-12 DIAGNOSIS — Z7901 Long term (current) use of anticoagulants: Secondary | ICD-10-CM | POA: Diagnosis not present

## 2018-06-12 DIAGNOSIS — I4891 Unspecified atrial fibrillation: Secondary | ICD-10-CM

## 2018-06-12 LAB — POCT INR: INR: 3.1 — AB (ref 2.0–3.0)

## 2018-06-12 NOTE — Patient Instructions (Signed)
Description   No warfarin today, Wednesday Aug 28, then continue 1 tablet daily except 1/2 tablet on Wednesdays.  Repeat INR in 3 weeks

## 2018-06-29 NOTE — H&P (View-Only) (Signed)
Cardiology Office Note   Date:  07/04/2018   ID:  Colin Newman, DOB Jan 05, 1933, MRN [id number]  PCP:  [name], [name]  Cardiologist: Dr. [name]  Chief Complaint  Patient presents with  . Atrial Fibrillation  . Shortness of Breath  . Hypertension     History of Present Illness: Colin Newman is a 84 y.o. male who presents for follow up  of coronary artery disease,  history of a stent to the proximal LAD in 2005, Afib, marked first-degree AV block, hypertension, hyperlipidemia, and diabetes.  The patient is intolerant to multiple antihypertensives, to include (copy for accuracy):  Losartan - rash/angioedemia Lisinopril - itching HCTZ - itching Chlorthalidone - cost-prohibitive Hydralazine - caused him to be swimmy headed  When last seen in the office on 03/14/2018 the patient complained of worsening dyspnea on exertion, having to stop and rest frequently.  The patient had gained approximately 8 to 10 pounds, and had not been adhering to diabetic low-sodium diet. On that office visit the patient was found to have new onset atrial fibrillation for EKG, and it was suspected that the atrial fibrillation was contributing to his overall symptoms and fluid retention.  The patient was placed on Eliquis 5 mg twice daily for prevention of CVA with a CHADS VASC Score of 4.  An echocardiogram was ordered along with a TSH, BMET, hemoglobin A1c and CBC.  Consideration for cardioversion on next visit will be discussed if he remained in atrial fibrillation.   He was noted to have anemia with a hemoglobin of 10.8 and hematocrit of 32.5.  Hemoccult was positive. He was referred to GI and underwent EGD by Dr. [name] on 04/09/18. This showed Grade LA esophagitis and multiple angioectasias in the stomach, duodenum, and jejunum that were cauterized. Anticoagulation later resumed with Coumadin. INRs have been therapeutic since 05/15/18.   On follow up today he is still SOB with exertion. Has LE edema.  No known bleeding. No chest pain. Anxious to get back in NSR. Notes bruising on coumadin.   Past Medical History:  Diagnosis Date  . Constipation   . Diabetes mellitus type 2, noninsulin dependent ([place])       . Dyslipidemia    Hx with intolerance to statin therapy  . Hypertension   . Obesity   . Single vessel coronary disease    s/p stent LAD 2005    Past Surgical History:  Procedure Laterality Date  . BACK SURGERY     2 previous  . BILATERAL KNEE ARTHROSCOPY    . CARDIOVASCULAR STRESS TEST  07-20-2009   EF 73%  . CORONARY ANGIOPLASTY WITH STENT PLACEMENT  2005  . ESOPHAGOGASTRODUODENOSCOPY N/A 04/09/2018   Procedure: ESOPHAGOGASTRODUODENOSCOPY (EGD);  Surgeon: [name], MD;  Location: [name];  Service: Endoscopy;  Laterality: N/A;  . FINGER SURGERY Right 2018   right 5th finger  . HOT HEMOSTASIS N/A 04/09/2018   Procedure: HOT HEMOSTASIS (ARGON PLASMA COAGULATION/BICAP);  Surgeon: [name], MD;  Location: [name];  Service: Endoscopy;  Laterality: N/A;  . OTHER SURGICAL HISTORY     Stent in the proximal left anterior descending in 2005     Current Outpatient Medications  Medication Sig Dispense Refill  . amLODipine (NORVASC) 5 MG tablet Take 1 tablet (5 mg total) by mouth daily. 90 tablet 3  . atorvastatin (LIPITOR) 40 MG tablet Take 1 tablet (40 mg total) by mouth daily. 90 tablet 2  . furosemide (LASIX) 20 MG tablet Take 1 tablet (20  mg total) by mouth daily. 30 tablet 3  . metFORMIN (GLUCOPHAGE) 1000 MG tablet Take 1 tablet (1,000 mg total) by mouth 2 (two) times daily. 180 tablet 1  . metoprolol tartrate (LOPRESSOR) 50 MG tablet Take 1 tablet (50 mg total) by mouth 2 (two) times daily. 180 tablet 2  . Multiple Vitamin (MULTI-VITAMIN DAILY PO) Take 1 tablet by mouth daily.     . nitroGLYCERIN (NITROSTAT) 0.4 MG SL tablet DISSOLVE ONE TABLET UNDER THE TONGUE EVERY 5 MINUTES AS NEEDED FOR CHEST PAIN.  DO NOT EXCEED A TOTAL OF 3 DOSES IN 15 MINUTES 10  tablet 0  . Omega-3 Fatty Acids (FISH OIL) 1200 MG CAPS Take 1,200 mg by mouth daily.     [name] warfarin (COUMADIN) 5 MG tablet Take 1 tablet by mouth daily or as directed by coumadin clinic 30 tablet 3   No current facility-administered medications for this visit.     Allergies:   Lisinopril; Losartan; and Hctz [hydrochlorothiazide]    Social History:  The patient  reports that he has quit smoking. His smokeless tobacco use includes chew. He reports that he does not drink alcohol or use drugs.   Family History:  The patient's family history includes Cancer in his father; Pulmonary embolism in his mother.    ROS: All other systems are reviewed and negative. Unless otherwise mentioned in H&P    PHYSICAL EXAM: VS:  BP (!) 160/70   Pulse 72   Ht 5\' 11"  (1.803 m)   Wt 229 lb 12.8 oz (104.2 kg)   BMI 32.05 kg/m  , BMI Body mass index is 32.05 kg/m. GENERAL:  Well appearing overweight WM in NAD HEENT:  PERRL, EOMI, sclera are clear. Oropharynx is clear. NECK:  No jugular venous distention, carotid upstroke brisk and symmetric, no bruits, no thyromegaly or adenopathy LUNGS:  Clear to auscultation bilaterally CHEST:  Unremarkable HEART:  IRRR,  PMI not displaced or sustained,S1 and S2 within normal limits, no S3, no S4: no clicks, no rubs, no murmurs ABD:  Soft, nontender. BS +, no masses or bruits. No hepatomegaly, no splenomegaly EXT:  2 + pulses throughout, no edema, no cyanosis no clubbing SKIN:  Warm and dry.  No rashes NEURO:  Alert and oriented x 3. Cranial nerves II through XII intact. PSYCH:  Cognitively intact     Recent Labs: 03/14/2018: BUN 19; Creatinine, Ser 1.19; Potassium 4.9; Sodium 142; TSH 3.790 03/27/2018: Hemoglobin 10.6; Platelets 215  03/14/18 A1c 7.4%.    Lipid Panel    Component Value Date/Time   CHOL 102 (L) 07/17/2016 1026   TRIG 160 (H) 07/17/2016 1026   HDL 34 (L) 07/17/2016 1026   CHOLHDL 3.0 07/17/2016 1026   VLDL 32 (H) 07/17/2016 1026   LDLCALC  36 07/17/2016 1026   LDLDIRECT 66 11/04/2013 1032      Wt Readings from Last 3 Encounters:  07/04/18 229 lb 12.8 oz (104.2 kg)  04/08/18 227 lb (103 kg)  03/27/18 228 lb (103.4 kg)      Other studies Reviewed: NM Stress Test 08-06-2012  Echo 03/15/2018 :  Normal LV systolic function with an EF of 60% to 65%, no evidence of thrombus, aortic valve noncoronary cusp mobility was mildly restricted, the left atrium was only mildly dilated, the right atrium was only mildly dilated, and he was found to have moderately increased PA pressure of 51 mmHg.  ASSESSMENT AND PLAN:  1.  Atrial fib: Persistent. Rate is controlled currently with metoprolol 50 mg  twice daily, he is on Coumadin for anticoagulation and INRs have been therapeutic.   CHADS Vasc Score of 4.  We will check lab work today and schedule for DCCV next week. The procedure and risks were reviewed including but not limited to death, stroke, arrythmias, bleeding.The patient voices understanding and is agreeable to proceed.  If he does not hold we discussed the possibility of AAD therapy.  2.  Anemia with heme positive stool:  Due to esophagitis and multiple upper tract angioectasias. S/p cautery in June. Will check Hgb today. If he has ongoing bleeding this may limit long term anticoagulation  3.  Hypertension: Blood pressure is elevated today. Will monitor for now until DCCV.   4. CAD s/p remote stent of LAD in 2005. No ASA since on coumadin and is at higher bleeding risk.

## 2018-06-29 NOTE — Progress Notes (Signed)
Cardiology Office Note   Date:  07/04/2018   ID:  Colin Newman, DOB [birth year], MRN [id number]  PCP:  [name], [place]  Cardiologist: Dr. [name]  Chief Complaint  Patient presents with  . Atrial Fibrillation  . Shortness of Breath  . Hypertension     History of Present Illness: Colin Newman is a 84 y.o. male who presents for follow up  of coronary artery disease,  history of a stent to the proximal LAD in 2005, Afib, marked first-degree AV block, hypertension, hyperlipidemia, and diabetes.  The patient is intolerant to multiple antihypertensives, to include (copy for accuracy):  Losartan - rash/angioedemia Lisinopril - itching HCTZ - itching Chlorthalidone - cost-prohibitive Hydralazine - caused him to be swimmy headed  When last seen in the office on 03/14/2018 the patient complained of worsening dyspnea on exertion, having to stop and rest frequently.  The patient had gained approximately 8 to 10 pounds, and had not been adhering to diabetic low-sodium diet. On that office visit the patient was found to have new onset atrial fibrillation for EKG, and it was suspected that the atrial fibrillation was contributing to his overall symptoms and fluid retention.  The patient was placed on Eliquis 5 mg twice daily for prevention of CVA with a CHADS VASC Score of 4.  An echocardiogram was ordered along with a TSH, BMET, hemoglobin A1c and CBC.  Consideration for cardioversion on next visit will be discussed if he remained in atrial fibrillation.   He was noted to have anemia with a hemoglobin of 10.8 and hematocrit of 32.5.  Hemoccult was positive. He was referred to GI and underwent EGD by Dr. [name] on 04/09/18. This showed Grade LA esophagitis and multiple angioectasias in the stomach, duodenum, and jejunum that were cauterized. Anticoagulation later resumed with Coumadin. INRs have been therapeutic since 05/15/18.   On follow up today he is still SOB with exertion. Has LE edema.  No known bleeding. No chest pain. Anxious to get back in NSR. Notes bruising on coumadin.   Past Medical History:  Diagnosis Date  . Constipation   . Diabetes mellitus type 2, noninsulin dependent ([place])       . Dyslipidemia    Hx with intolerance to statin therapy  . Hypertension   . Obesity   . Single vessel coronary disease    s/p stent LAD 2005    Past Surgical History:  Procedure Laterality Date  . BACK SURGERY     2 previous  . BILATERAL KNEE ARTHROSCOPY    . CARDIOVASCULAR STRESS TEST  07-20-2009   EF 73%  . CORONARY ANGIOPLASTY WITH STENT PLACEMENT  2005  . ESOPHAGOGASTRODUODENOSCOPY N/A 04/09/2018   Procedure: ESOPHAGOGASTRODUODENOSCOPY (EGD);  Surgeon: [name], MD;  Location: [name];  Service: Endoscopy;  Laterality: N/A;  . FINGER SURGERY Right 2018   right 5th finger  . HOT HEMOSTASIS N/A 04/09/2018   Procedure: HOT HEMOSTASIS (ARGON PLASMA COAGULATION/BICAP);  Surgeon: [name], MD;  Location: [name];  Service: Endoscopy;  Laterality: N/A;  . OTHER SURGICAL HISTORY     Stent in the proximal left anterior descending in 2005     Current Outpatient Medications  Medication Sig Dispense Refill  . amLODipine (NORVASC) 5 MG tablet Take 1 tablet (5 mg total) by mouth daily. 90 tablet 3  . atorvastatin (LIPITOR) 40 MG tablet Take 1 tablet (40 mg total) by mouth daily. 90 tablet 2  . furosemide (LASIX) 20 MG tablet Take 1 tablet (20  mg total) by mouth daily. 30 tablet 3  . metFORMIN (GLUCOPHAGE) 1000 MG tablet Take 1 tablet (1,000 mg total) by mouth 2 (two) times daily. 180 tablet 1  . metoprolol tartrate (LOPRESSOR) 50 MG tablet Take 1 tablet (50 mg total) by mouth 2 (two) times daily. 180 tablet 2  . Multiple Vitamin (MULTI-VITAMIN DAILY PO) Take 1 tablet by mouth daily.     . nitroGLYCERIN (NITROSTAT) 0.4 MG SL tablet DISSOLVE ONE TABLET UNDER THE TONGUE EVERY 5 MINUTES AS NEEDED FOR CHEST PAIN.  DO NOT EXCEED A TOTAL OF 3 DOSES IN 15 MINUTES 10  tablet 0  . Omega-3 Fatty Acids (FISH OIL) 1200 MG CAPS Take 1,200 mg by mouth daily.     [name] warfarin (COUMADIN) 5 MG tablet Take 1 tablet by mouth daily or as directed by coumadin clinic 30 tablet 3   No current facility-administered medications for this visit.     Allergies:   Lisinopril; Losartan; and Hctz [hydrochlorothiazide]    Social History:  The patient  reports that he has quit smoking. His smokeless tobacco use includes chew. He reports that he does not drink alcohol or use drugs.   Family History:  The patient's family history includes Cancer in his father; Pulmonary embolism in his mother.    ROS: All other systems are reviewed and negative. Unless otherwise mentioned in H&P    PHYSICAL EXAM: VS:  BP (!) 160/70   Pulse 72   Ht 5\' 11"  (1.803 m)   Wt 229 lb 12.8 oz (104.2 kg)   BMI 32.05 kg/m  , BMI Body mass index is 32.05 kg/m. GENERAL:  Well appearing overweight WM in NAD HEENT:  PERRL, EOMI, sclera are clear. Oropharynx is clear. NECK:  No jugular venous distention, carotid upstroke brisk and symmetric, no bruits, no thyromegaly or adenopathy LUNGS:  Clear to auscultation bilaterally CHEST:  Unremarkable HEART:  IRRR,  PMI not displaced or sustained,S1 and S2 within normal limits, no S3, no S4: no clicks, no rubs, no murmurs ABD:  Soft, nontender. BS +, no masses or bruits. No hepatomegaly, no splenomegaly EXT:  2 + pulses throughout, no edema, no cyanosis no clubbing SKIN:  Warm and dry.  No rashes NEURO:  Alert and oriented x 3. Cranial nerves II through XII intact. PSYCH:  Cognitively intact     Recent Labs: 03/14/2018: BUN 19; Creatinine, Ser 1.19; Potassium 4.9; Sodium 142; TSH 3.790 03/27/2018: Hemoglobin 10.6; Platelets 215  03/14/18 A1c 7.4%.    Lipid Panel    Component Value Date/Time   CHOL 102 (L) 07/17/2016 1026   TRIG 160 (H) 07/17/2016 1026   HDL 34 (L) 07/17/2016 1026   CHOLHDL 3.0 07/17/2016 1026   VLDL 32 (H) 07/17/2016 1026   LDLCALC  36 07/17/2016 1026   LDLDIRECT 66 11/04/2013 1032      Wt Readings from Last 3 Encounters:  07/04/18 229 lb 12.8 oz (104.2 kg)  04/08/18 227 lb (103 kg)  03/27/18 228 lb (103.4 kg)      Other studies Reviewed: NM Stress Test Aug 03, 2012  Echo 03/15/2018 :  Normal LV systolic function with an EF of 60% to 65%, no evidence of thrombus, aortic valve noncoronary cusp mobility was mildly restricted, the left atrium was only mildly dilated, the right atrium was only mildly dilated, and he was found to have moderately increased PA pressure of 51 mmHg.  ASSESSMENT AND PLAN:  1.  Atrial fib: Persistent. Rate is controlled currently with metoprolol 50 mg  twice daily, he is on Coumadin for anticoagulation and INRs have been therapeutic.   CHADS Vasc Score of 4.  We will check lab work today and schedule for DCCV next week. The procedure and risks were reviewed including but not limited to death, stroke, arrythmias, bleeding.The patient voices understanding and is agreeable to proceed.  If he does not hold we discussed the possibility of AAD therapy.  2.  Anemia with heme positive stool:  Due to esophagitis and multiple upper tract angioectasias. S/p cautery in June. Will check Hgb today. If he has ongoing bleeding this may limit long term anticoagulation  3.  Hypertension: Blood pressure is elevated today. Will monitor for now until DCCV.   4. CAD s/p remote stent of LAD in 2005. No ASA since on coumadin and is at higher bleeding risk.

## 2018-07-04 ENCOUNTER — Ambulatory Visit (INDEPENDENT_AMBULATORY_CARE_PROVIDER_SITE_OTHER): Payer: Medicare Other | Admitting: Pharmacist Clinician (PhC)/ Clinical Pharmacy Specialist

## 2018-07-04 ENCOUNTER — Ambulatory Visit (INDEPENDENT_AMBULATORY_CARE_PROVIDER_SITE_OTHER): Payer: Medicare Other | Admitting: Cardiology

## 2018-07-04 ENCOUNTER — Encounter: Payer: Self-pay | Admitting: Cardiology

## 2018-07-04 ENCOUNTER — Other Ambulatory Visit: Payer: Self-pay | Admitting: Cardiology

## 2018-07-04 VITALS — BP 160/70 | HR 72 | Ht 71.0 in | Wt 229.8 lb

## 2018-07-04 DIAGNOSIS — Z7901 Long term (current) use of anticoagulants: Secondary | ICD-10-CM | POA: Diagnosis not present

## 2018-07-04 DIAGNOSIS — I2510 Atherosclerotic heart disease of native coronary artery without angina pectoris: Secondary | ICD-10-CM | POA: Diagnosis not present

## 2018-07-04 DIAGNOSIS — I4819 Other persistent atrial fibrillation: Secondary | ICD-10-CM

## 2018-07-04 DIAGNOSIS — I10 Essential (primary) hypertension: Secondary | ICD-10-CM | POA: Diagnosis not present

## 2018-07-04 DIAGNOSIS — I4891 Unspecified atrial fibrillation: Secondary | ICD-10-CM

## 2018-07-04 LAB — IRON: Iron: 61 ug/dL (ref 38–169)

## 2018-07-04 NOTE — Patient Instructions (Addendum)
We will check blood work today   We will arrange a DC cardioversion next week.     You are scheduled for a Cardioversion on Tuesday 07/09/18 with Dr.Croitoru.  Please arrive at the [facility] (Main Entrance A) at [facility]: [address] [place], [place] [address] at 12:00 noon. (1 hour prior to procedure unless lab work is needed; if lab work is needed arrive 1.5 hours ahead)  DIET: Nothing to eat or drink after midnight except a sip of water with medications (see medication instructions below)  Medication Instructions:  Continue your anticoagulant: Coumadin You will need to continue your anticoagulant after your procedure until you  are told by your  Provider that it is safe to stop   Labs: INR,CBC,BMET,IRON  Today 07/04/18  You must have a responsible person to drive you home and stay in the waiting area during your procedure. Failure to do so could result in cancellation.  Bring your insurance cards.  *Special Note: Every effort is made to have your procedure done on time. Occasionally there are emergencies that occur at the hospital that may cause delays. Please be patient if a delay does occur.

## 2018-07-05 LAB — BASIC METABOLIC PANEL
BUN/Creatinine Ratio: 13 (ref 10–24)
BUN: 13 mg/dL (ref 8–27)
CHLORIDE: 102 mmol/L (ref 96–106)
CO2: 22 mmol/L (ref 20–29)
Calcium: 8.7 mg/dL (ref 8.6–10.2)
Creatinine, Ser: 1.03 mg/dL (ref 0.76–1.27)
GFR calc Af Amer: 77 mL/min/{1.73_m2} (ref >59)
GFR calc non Af Amer: 67 mL/min/{1.73_m2} (ref >59)
GLUCOSE: 136 mg/dL — AB (ref 65–99)
POTASSIUM: 4.7 mmol/L (ref 3.5–5.2)
Sodium: 142 mmol/L (ref 134–144)

## 2018-07-05 LAB — CBC WITH DIFFERENTIAL/PLATELET
Basophils Absolute: 0 10*3/uL (ref 0.0–0.2)
Basos: 0 %
EOS (ABSOLUTE): 0.2 10*3/uL (ref 0.0–0.4)
EOS: 4 %
Hematocrit: 31.4 % — ABNORMAL LOW (ref 37.5–51.0)
Hemoglobin: 10.5 g/dL — ABNORMAL LOW (ref 13.0–17.7)
IMMATURE GRANULOCYTES: 0 %
Immature Grans (Abs): 0 10*3/uL (ref 0.0–0.1)
Lymphocytes Absolute: 1.1 10*3/uL (ref 0.7–3.1)
Lymphs: 19 %
MCH: 33.1 pg — ABNORMAL HIGH (ref 26.6–33.0)
MCHC: 33.4 g/dL (ref 31.5–35.7)
MCV: 99 fL — ABNORMAL HIGH (ref 79–97)
MONOCYTES: 12 %
Monocytes Absolute: 0.7 10*3/uL (ref 0.1–0.9)
NEUTROS PCT: 65 %
Neutrophils Absolute: 3.7 10*3/uL (ref 1.4–7.0)
PLATELETS: 254 10*3/uL (ref 150–450)
RBC: 3.17 x10E6/uL — AB (ref 4.14–5.80)
RDW: 16.4 % — AB (ref 12.3–15.4)
WBC: 5.7 10*3/uL (ref 3.4–10.8)

## 2018-07-05 LAB — PT AND PTT
INR: 3.3 — ABNORMAL HIGH (ref 0.8–1.2)
Prothrombin Time: 31 s — ABNORMAL HIGH (ref 9.1–12.0)
aPTT: 35 s — ABNORMAL HIGH (ref 24–33)

## 2018-07-09 ENCOUNTER — Encounter (HOSPITAL_COMMUNITY)
Admission: RE | Disposition: A | Discharge status: Home / Self Care | Payer: Self-pay | Source: Ambulatory Visit | Attending: Cardiovascular Disease

## 2018-07-09 ENCOUNTER — Ambulatory Visit (HOSPITAL_COMMUNITY): Payer: Medicare Other | Admitting: Certified Registered Nurse Anesthetist

## 2018-07-09 ENCOUNTER — Ambulatory Visit (HOSPITAL_COMMUNITY)
Admission: RE | Admit: 2018-07-09 | Discharge: 2018-07-09 | Disposition: A | Discharge status: Home / Self Care | Payer: Medicare Other | Source: Ambulatory Visit | Attending: Cardiovascular Disease | Admitting: Cardiovascular Disease

## 2018-07-09 ENCOUNTER — Encounter (HOSPITAL_COMMUNITY): Payer: Self-pay | Admitting: *Deleted

## 2018-07-09 DIAGNOSIS — Z6832 Body mass index (BMI) 32.0-32.9, adult: Secondary | ICD-10-CM | POA: Diagnosis not present

## 2018-07-09 DIAGNOSIS — D649 Anemia, unspecified: Secondary | ICD-10-CM | POA: Diagnosis not present

## 2018-07-09 DIAGNOSIS — Z7984 Long term (current) use of oral hypoglycemic drugs: Secondary | ICD-10-CM | POA: Diagnosis not present

## 2018-07-09 DIAGNOSIS — I4819 Other persistent atrial fibrillation: Secondary | ICD-10-CM

## 2018-07-09 DIAGNOSIS — E119 Type 2 diabetes mellitus without complications: Secondary | ICD-10-CM | POA: Diagnosis not present

## 2018-07-09 DIAGNOSIS — Z87891 Personal history of nicotine dependence: Secondary | ICD-10-CM | POA: Diagnosis not present

## 2018-07-09 DIAGNOSIS — Z9889 Other specified postprocedural states: Secondary | ICD-10-CM | POA: Diagnosis not present

## 2018-07-09 DIAGNOSIS — I481 Persistent atrial fibrillation: Secondary | ICD-10-CM

## 2018-07-09 DIAGNOSIS — I440 Atrioventricular block, first degree: Secondary | ICD-10-CM | POA: Insufficient documentation

## 2018-07-09 DIAGNOSIS — I4891 Unspecified atrial fibrillation: Secondary | ICD-10-CM | POA: Diagnosis not present

## 2018-07-09 DIAGNOSIS — Z888 Allergy status to other drugs, medicaments and biological substances status: Secondary | ICD-10-CM | POA: Insufficient documentation

## 2018-07-09 DIAGNOSIS — K209 Esophagitis, unspecified: Secondary | ICD-10-CM | POA: Diagnosis not present

## 2018-07-09 DIAGNOSIS — Z7901 Long term (current) use of anticoagulants: Secondary | ICD-10-CM | POA: Diagnosis not present

## 2018-07-09 DIAGNOSIS — E785 Hyperlipidemia, unspecified: Secondary | ICD-10-CM | POA: Insufficient documentation

## 2018-07-09 DIAGNOSIS — I2510 Atherosclerotic heart disease of native coronary artery without angina pectoris: Secondary | ICD-10-CM | POA: Insufficient documentation

## 2018-07-09 DIAGNOSIS — Z79899 Other long term (current) drug therapy: Secondary | ICD-10-CM | POA: Diagnosis not present

## 2018-07-09 DIAGNOSIS — E669 Obesity, unspecified: Secondary | ICD-10-CM | POA: Diagnosis not present

## 2018-07-09 DIAGNOSIS — I10 Essential (primary) hypertension: Secondary | ICD-10-CM | POA: Diagnosis not present

## 2018-07-09 DIAGNOSIS — Z955 Presence of coronary angioplasty implant and graft: Secondary | ICD-10-CM | POA: Diagnosis not present

## 2018-07-09 HISTORY — PX: CARDIOVERSION: SHX1299

## 2018-07-09 LAB — GLUCOSE, CAPILLARY: Glucose-Capillary: 136 mg/dL — ABNORMAL HIGH (ref 70–99)

## 2018-07-09 SURGERY — CARDIOVERSION
Anesthesia: General

## 2018-07-09 MED ORDER — LIDOCAINE 2% (20 MG/ML) 5 ML SYRINGE
INTRAMUSCULAR | Status: DC | PRN
  Administered 2018-07-09: 40 mg via INTRAVENOUS

## 2018-07-09 MED ORDER — PROPOFOL 10 MG/ML IV BOLUS
INTRAVENOUS | Status: DC | PRN
  Administered 2018-07-09: 80 mg via INTRAVENOUS

## 2018-07-09 MED ORDER — SODIUM CHLORIDE 0.9 % IV SOLN
INTRAVENOUS | Status: DC
  Administered 2018-07-09: 12:00:00 via INTRAVENOUS

## 2018-07-09 NOTE — Anesthesia Postprocedure Evaluation (Signed)
Anesthesia Post Note  Patient: Colin Newman  Procedure(s) Performed: CARDIOVERSION (N/A )     Patient location during evaluation: Endoscopy Anesthesia Type: General Level of consciousness: awake and alert Pain management: pain level controlled Vital Signs Assessment: post-procedure vital signs reviewed and stable Respiratory status: spontaneous breathing, nonlabored ventilation, respiratory function stable and patient connected to nasal cannula oxygen Cardiovascular status: blood pressure returned to baseline and stable Postop Assessment: no apparent nausea or vomiting Anesthetic complications: no    Last Vitals:  Vitals:   07/09/18 1153 07/09/18 1231  BP: (!) 170/61 (!) 138/47  Pulse: 68   Resp: 17 19  Temp: 37.1 C 36.9 C  SpO2: 97% 99%    Last Pain:  Vitals:   07/09/18 1240  TempSrc:   PainSc: 0-No pain                 Marshella Tello COKER

## 2018-07-09 NOTE — Op Note (Signed)
Procedure: Electrical Cardioversion Indications:  Atrial Fibrillation  Procedure Details:  Consent: Risks of procedure as well as the alternatives and risks of each were explained to the (patient/caregiver).  Consent for procedure obtained.  Time Out: Verified patient identification, verified procedure, site/side was marked, verified correct patient position, special equipment/implants available, medications/allergies/relevent history reviewed, required imaging and test results available.  Performed  Patient placed on cardiac monitor, pulse oximetry, supplemental oxygen as necessary.  Sedation given: Propofol 80 mg IV, Dr. Linna Caprice Pacer pads placed anterior and posterior chest.  Cardioverted 2 time(s).  Cardioversion with synchronized biphasic 120J shock (unsuccessful), then 200J (successful).  Evaluation: Findings: Post procedure EKG shows: NSR Complications: None Patient did tolerate procedure well.  Time Spent Directly with the Patient:  30 minutes   Ayoub Arey 07/09/2018, 12:48 PM

## 2018-07-09 NOTE — Discharge Instructions (Signed)
Electrical Cardioversion, Care After °This sheet gives you information about how to care for yourself after your procedure. Your health care provider may also give you more specific instructions. If you have problems or questions, contact your health care provider. °What can I expect after the procedure? °After the procedure, it is common to have: °· Some redness on the skin where the shocks were given. ° °Follow these instructions at home: °· Do not drive for 24 hours if you were given a medicine to help you relax (sedative). °· Take over-the-counter and prescription medicines only as told by your health care provider. °· Ask your health care provider how to check your pulse. Check it often. °· Rest for 48 hours after the procedure or as told by your health care provider. °· Avoid or limit your caffeine use as told by your health care provider. °Contact a health care provider if: °· You feel like your heart is beating too quickly or your pulse is not regular. °· You have a serious muscle cramp that does not go away. °Get help right away if: °· You have discomfort in your chest. °· You are dizzy or you feel faint. °· You have trouble breathing or you are short of breath. °· Your speech is slurred. °· You have trouble moving an arm or leg on one side of your body. °· Your fingers or toes turn cold or blue. °This information is not intended to replace advice given to you by your health care provider. Make sure you discuss any questions you have with your health care provider. °Document Released: 07/23/2013 Document Revised: 05/05/2016 Document Reviewed: 04/07/2016 °Elsevier Interactive Patient Education © 2018 Elsevier Inc. ° °

## 2018-07-09 NOTE — Anesthesia Preprocedure Evaluation (Addendum)
Anesthesia Evaluation  Patient identified by MRN, date of birth, ID band Patient awake    Reviewed: Allergy & Precautions, NPO status , Patient's Chart, lab work & pertinent test results  Airway Mallampati: II  TM Distance: >3 FB Neck ROM: Full    Dental  (+) Upper Dentures, Lower Dentures   Pulmonary former smoker,    Pulmonary exam normal        Cardiovascular hypertension, + CAD  + dysrhythmias Atrial Fibrillation  Rhythm:Irregular Rate:Abnormal     Neuro/Psych    GI/Hepatic   Endo/Other  diabetes, Type 2  Renal/GU      Musculoskeletal  (+) Arthritis ,   Abdominal Normal abdominal exam  (+)   Peds  Hematology   Anesthesia Other Findings   Reproductive/Obstetrics                             Anesthesia Physical Anesthesia Plan  ASA: III  Anesthesia Plan: General   Post-op Pain Management:    Induction: Intravenous  PONV Risk Score and Plan:   Airway Management Planned: Mask  Additional Equipment:   Intra-op Plan:   Post-operative Plan:   Informed Consent: I have reviewed the patients History and Physical, chart, labs and discussed the procedure including the risks, benefits and alternatives for the proposed anesthesia with the patient or authorized representative who has indicated his/her understanding and acceptance.   Dental advisory given  Plan Discussed with:   Anesthesia Plan Comments:         Anesthesia Quick Evaluation

## 2018-07-09 NOTE — Transfer of Care (Addendum)
Immediate Anesthesia Transfer of Care Note  Patient: Colin Newman  Procedure(s) Performed: CARDIOVERSION (N/A )  Patient Location: Endoscopy Unit  Anesthesia Type:General  Level of Consciousness: sedated  Airway & Oxygen Therapy: Patient Spontanous Breathing and Patient connected to nasal cannula oxygen  Post-op Assessment: Report given to RN and Post -op Vital signs reviewed and stable  Post vital signs: Reviewed and stable  Last Vitals:  Vitals Value Taken Time  BP    Temp    Pulse    Resp    SpO2      Last Pain:  Vitals:   07/09/18 1153  TempSrc: Oral  PainSc: 0-No pain         Complications: No apparent anesthesia complications

## 2018-07-09 NOTE — Interval H&P Note (Signed)
History and Physical Interval Note:  07/09/2018 12:14 PM  Colin Newman  has presented today for surgery, with the diagnosis of AFIB  The various methods of treatment have been discussed with the patient and family. After consideration of risks, benefits and other options for treatment, the patient has consented to  Procedure(s): CARDIOVERSION (N/A) as a surgical intervention .  The patient's history has been reviewed, patient examined, no change in status, stable for surgery.  I have reviewed the patient's chart and labs.  Questions were answered to the patient's satisfaction.     [name]

## 2018-07-09 NOTE — Anesthesia Procedure Notes (Signed)
Procedure Name: General with mask airway Date/Time: 07/09/2018 12:21 PM Performed by: Leonor Liv, CRNA Oxygen Delivery Method: Ambu bag Induction Type: IV induction

## 2018-07-10 ENCOUNTER — Encounter (HOSPITAL_COMMUNITY): Payer: Self-pay | Admitting: Cardiovascular Disease

## 2018-07-22 ENCOUNTER — Ambulatory Visit (INDEPENDENT_AMBULATORY_CARE_PROVIDER_SITE_OTHER): Payer: Medicare Other | Admitting: Pharmacist

## 2018-07-22 DIAGNOSIS — I4891 Unspecified atrial fibrillation: Secondary | ICD-10-CM | POA: Diagnosis not present

## 2018-07-22 DIAGNOSIS — Z7901 Long term (current) use of anticoagulants: Secondary | ICD-10-CM | POA: Diagnosis not present

## 2018-07-22 LAB — POCT INR: INR: 2.7 (ref 2.0–3.0)

## 2018-07-29 NOTE — Progress Notes (Signed)
Cardiology Office Note   Date:  08/01/2018   ID:  BLONG BUSK, DOB February 05, 1933, MRN [id number]  PCP:  [name], [place]  Cardiologist: Dr. [name]  Chief Complaint  Patient presents with  . Atrial Fibrillation  . Atrial Flutter     History of Present Illness: Colin Newman is a 84 y.o. male who presents for follow up  of coronary artery disease and Afib,  history of a stent to the proximal LAD in 2005, Afib, marked first-degree AV block, hypertension, hyperlipidemia, and diabetes.  The patient is intolerant to multiple antihypertensives, to include (copy for accuracy):  Losartan - rash/angioedemia Lisinopril - itching HCTZ - itching Chlorthalidone - cost-prohibitive Hydralazine - caused him to be swimmy headed  When last seen in the office on 03/14/2018 the patient complained of worsening dyspnea on exertion, having to stop and rest frequently.  The patient had gained approximately 8 to 10 pounds, and had not been adhering to diabetic low-sodium diet. On that office visit the patient was found to have new onset atrial fibrillation for EKG, and it was suspected that the atrial fibrillation was contributing to his overall symptoms and fluid retention.  The patient was placed on Eliquis 5 mg twice daily for prevention of CVA with a CHADS VASC Score of 4.  An echocardiogram was ordered along with a TSH, BMET, hemoglobin A1c and CBC.     He was noted to have anemia with a hemoglobin of 10.8 and hematocrit of 32.5.  Hemoccult was positive. He was referred to GI and underwent EGD by Dr. [name] on 04/09/18. This showed Grade LA esophagitis and multiple angioectasias in the stomach, duodenum, and jejunum that were cauterized. Anticoagulation later resumed with Coumadin. INRs were therapeutic and he underwent successful DCCV on 07/09/18.   On follow up today he feels he is doing much better. Still has DOE walking class 2 but thinks it is much better. Mild LE edema. No dizziness, syncope,  chest pain or palpitations. No bleeding noted on coumadin. INR 2.7  10 days ago.    Past Medical History:  Diagnosis Date  . Constipation   . Diabetes mellitus type 2, noninsulin dependent ([place])       . Dyslipidemia    Hx with intolerance to statin therapy  . Hypertension   . Obesity   . Single vessel coronary disease    s/p stent LAD 2005    Past Surgical History:  Procedure Laterality Date  . BACK SURGERY     2 previous  . BILATERAL KNEE ARTHROSCOPY    . CARDIOVASCULAR STRESS TEST  07-20-2009   EF 73%  . CARDIOVERSION N/A 07/09/2018   Procedure: CARDIOVERSION;  Surgeon: Sanda Klein, MD;  Location: [facility];  Service: Cardiovascular;  Laterality: N/A;  . CORONARY ANGIOPLASTY WITH STENT PLACEMENT  2005  . ESOPHAGOGASTRODUODENOSCOPY N/A 04/09/2018   Procedure: ESOPHAGOGASTRODUODENOSCOPY (EGD);  Surgeon: [name], MD;  Location: [name];  Service: Endoscopy;  Laterality: N/A;  . FINGER SURGERY Right 2018   right 5th finger  . HOT HEMOSTASIS N/A 04/09/2018   Procedure: HOT HEMOSTASIS (ARGON PLASMA COAGULATION/BICAP);  Surgeon: [name], MD;  Location: [name];  Service: Endoscopy;  Laterality: N/A;  . OTHER SURGICAL HISTORY     Stent in the proximal left anterior descending in 2005     Current Outpatient Medications  Medication Sig Dispense Refill  . amLODipine (NORVASC) 5 MG tablet Take 1 tablet (5 mg total) by mouth daily. 90 tablet 3  .  atorvastatin (LIPITOR) 40 MG tablet Take 1 tablet (40 mg total) by mouth daily. 90 tablet 2  . furosemide (LASIX) 20 MG tablet Take 1 tablet (20 mg total) by mouth daily. 30 tablet 3  . metFORMIN (GLUCOPHAGE) 1000 MG tablet Take 1 tablet (1,000 mg total) by mouth 2 (two) times daily. 180 tablet 1  . metoprolol tartrate (LOPRESSOR) 50 MG tablet Take 1 tablet (50 mg total) by mouth 2 (two) times daily. 180 tablet 2  . Multiple Vitamin (MULTI-VITAMIN DAILY PO) Take 1 tablet by mouth daily.     . nitroGLYCERIN  (NITROSTAT) 0.4 MG SL tablet DISSOLVE ONE TABLET UNDER THE TONGUE EVERY 5 MINUTES AS NEEDED FOR CHEST PAIN.  DO NOT EXCEED A TOTAL OF 3 DOSES IN 15 MINUTES (Patient taking differently: Place 0.4 mg under the tongue every 5 (five) minutes as needed for chest pain. ) 10 tablet 0  . Omega-3 Fatty Acids (FISH OIL) 1200 MG CAPS Take 1,200 mg by mouth daily.     [name] warfarin (COUMADIN) 5 MG tablet Take 1 tablet by mouth daily or as directed by coumadin clinic (Patient taking differently: Take 2.5-5 mg by mouth See admin instructions. Take 5mg  by mouth daily for 6 days and 2.5mg  for 1 day) 30 tablet 3   No current facility-administered medications for this visit.     Allergies:   Lisinopril; Losartan; and Hctz [hydrochlorothiazide]    Social History:  The patient  reports that he has quit smoking. His smokeless tobacco use includes chew. He reports that he does not drink alcohol or use drugs.   Family History:  The patient's family history includes Cancer in his father; Pulmonary embolism in his mother.    ROS: All other systems are reviewed and negative. Unless otherwise mentioned in H&P    PHYSICAL EXAM: VS:  BP (!) 130/58   Pulse 67   Ht 5\' 11"  (1.803 m)   Wt 227 lb (103 kg)   BMI 31.66 kg/m  , BMI Body mass index is 31.66 kg/m. GENERAL:  Well appearing, obese WM in NAD HEENT:  PERRL, EOMI, sclera are clear. Oropharynx is clear. NECK:  No jugular venous distention, carotid upstroke brisk and symmetric, no bruits, no thyromegaly or adenopathy LUNGS:  Clear to auscultation bilaterally CHEST:  Unremarkable HEART:  RRR,  PMI not displaced or sustained,S1 and S2 within normal limits, no S3, no S4: no clicks, no rubs, no murmurs ABD:  Soft, nontender. BS +, no masses or bruits. No hepatomegaly, no splenomegaly EXT:  2 + pulses throughout, tr edema, s/p bilateral TKR. no cyanosis no clubbing SKIN:  Warm and dry.  No rashes NEURO:  Alert and oriented x 3. Cranial nerves II through XII  intact. PSYCH:  Cognitively intact       Recent Labs: 03/14/2018: TSH 3.790 07/04/2018: BUN 13; Creatinine, Ser 1.03; Hemoglobin 10.5; Platelets 254; Potassium 4.7; Sodium 142  03/14/18 A1c 7.4%.    Lipid Panel    Component Value Date/Time   CHOL 102 (L) 07/17/2016 1026   TRIG 160 (H) 07/17/2016 1026   HDL 34 (L) 07/17/2016 1026   CHOLHDL 3.0 07/17/2016 1026   VLDL 32 (H) 07/17/2016 1026   LDLCALC 36 07/17/2016 1026   LDLDIRECT 66 11/04/2013 1032      Wt Readings from Last 3 Encounters:  08/01/18 227 lb (103 kg)  07/04/18 229 lb 12.8 oz (104.2 kg)  04/08/18 227 lb (103 kg)    Ecg today shows Atrial flutter with rate 67.  Nonspecific ST-T changes. I have personally reviewed and interpreted this study.   Other studies Reviewed: NM Stress Test 2012-08-02  Echo 03/15/2018 :  Normal LV systolic function with an EF of 60% to 65%, no evidence of thrombus, aortic valve noncoronary cusp mobility was mildly restricted, the left atrium was only mildly dilated, the right atrium was only mildly dilated, and he was found to have moderately increased PA pressure of 51 mmHg.  ASSESSMENT AND PLAN:  1.  Atrial fib:  S/p successful DCCV on 07/09/18. Now with early recurrence of Atrial flutter.  Rate is controlled currently with metoprolol 50 mg twice daily, he is on Coumadin for anticoagulation and INRs have been therapeutic.   CHADS Vasc Score of 4.  Despite return now of atrial flutter he is clinically improved. At this point I would recommend continued management with rate control and anticoagulation. We did discuss AAD therapy or ablation. I think AAD therapy will likely result in increased risk of side effects and cost. He is not a good ablation candidate due to age. My main concern is whether he will be able to tolerate long term anticoagulation with history of GI bleed but so far he is doing well on coumadin and the need for anticoagulation is there whether or not he is able to return to NSR.    2.  Anemia with heme positive stool:  Due to esophagitis and multiple upper tract angioectasias. S/p cautery in June. Need to watch INR closely to avoid bleeding.   3.  Hypertension: Blood pressure is well controlled today.   4. CAD s/p remote stent of LAD in 2005. No ASA since on coumadin and is at higher bleeding risk.

## 2018-08-01 ENCOUNTER — Encounter: Payer: Self-pay | Admitting: Cardiology

## 2018-08-01 ENCOUNTER — Ambulatory Visit (INDEPENDENT_AMBULATORY_CARE_PROVIDER_SITE_OTHER): Payer: Medicare Other | Admitting: Cardiology

## 2018-08-01 VITALS — BP 130/58 | HR 67 | Ht 71.0 in | Wt 227.0 lb

## 2018-08-01 DIAGNOSIS — Z7901 Long term (current) use of anticoagulants: Secondary | ICD-10-CM

## 2018-08-01 DIAGNOSIS — I483 Typical atrial flutter: Secondary | ICD-10-CM | POA: Diagnosis not present

## 2018-08-01 DIAGNOSIS — I2510 Atherosclerotic heart disease of native coronary artery without angina pectoris: Secondary | ICD-10-CM

## 2018-08-01 DIAGNOSIS — I4891 Unspecified atrial fibrillation: Secondary | ICD-10-CM

## 2018-08-01 NOTE — Patient Instructions (Signed)
Continue your current therapy  I will see you in 4 months  

## 2018-08-13 ENCOUNTER — Other Ambulatory Visit: Payer: Self-pay | Admitting: Cardiology

## 2018-08-13 ENCOUNTER — Ambulatory Visit (INDEPENDENT_AMBULATORY_CARE_PROVIDER_SITE_OTHER): Payer: Medicare Other | Admitting: Pharmacist Clinician (PhC)/ Clinical Pharmacy Specialist

## 2018-08-13 DIAGNOSIS — I4891 Unspecified atrial fibrillation: Secondary | ICD-10-CM | POA: Diagnosis not present

## 2018-08-13 DIAGNOSIS — Z7901 Long term (current) use of anticoagulants: Secondary | ICD-10-CM | POA: Diagnosis not present

## 2018-08-13 LAB — POCT INR: INR: 3.5 — AB (ref 2.0–3.0)

## 2018-08-13 NOTE — Patient Instructions (Signed)
Description   No warfarin today Tuesday Oct 29, then take 1 tablet daily except 1/2 tablet on Wednesdays and Sundays. Repeat INR in 2 weeks

## 2018-08-28 ENCOUNTER — Ambulatory Visit (INDEPENDENT_AMBULATORY_CARE_PROVIDER_SITE_OTHER): Payer: Medicare Other | Admitting: Pharmacist Clinician (PhC)/ Clinical Pharmacy Specialist

## 2018-08-28 DIAGNOSIS — Z7901 Long term (current) use of anticoagulants: Secondary | ICD-10-CM | POA: Diagnosis not present

## 2018-08-28 DIAGNOSIS — I4891 Unspecified atrial fibrillation: Secondary | ICD-10-CM

## 2018-08-28 LAB — POCT INR: INR: 1.6 — AB (ref 2.0–3.0)

## 2018-08-28 NOTE — Patient Instructions (Signed)
Description   Increase dose to 1 tablet daily except 1/2 tablet on Sundays. Repeat INR in 2 weeks

## 2018-09-09 ENCOUNTER — Ambulatory Visit (INDEPENDENT_AMBULATORY_CARE_PROVIDER_SITE_OTHER): Payer: Medicare Other | Admitting: *Deleted

## 2018-09-09 ENCOUNTER — Encounter: Payer: Self-pay | Admitting: Family

## 2018-09-09 ENCOUNTER — Other Ambulatory Visit (INDEPENDENT_AMBULATORY_CARE_PROVIDER_SITE_OTHER): Payer: Medicare Other

## 2018-09-09 ENCOUNTER — Ambulatory Visit (INDEPENDENT_AMBULATORY_CARE_PROVIDER_SITE_OTHER): Payer: Medicare Other | Admitting: Family

## 2018-09-09 VITALS — BP 142/61 | HR 61 | Temp 98.1°F | Resp 17 | Ht 71.0 in | Wt 223.0 lb

## 2018-09-09 VITALS — BP 142/61 | HR 61 | Temp 98.1°F | Resp 17

## 2018-09-09 DIAGNOSIS — Z0000 Encounter for general adult medical examination without abnormal findings: Secondary | ICD-10-CM | POA: Diagnosis not present

## 2018-09-09 DIAGNOSIS — G629 Polyneuropathy, unspecified: Secondary | ICD-10-CM

## 2018-09-09 DIAGNOSIS — Z23 Encounter for immunization: Secondary | ICD-10-CM

## 2018-09-09 DIAGNOSIS — R202 Paresthesia of skin: Secondary | ICD-10-CM

## 2018-09-09 DIAGNOSIS — E1169 Type 2 diabetes mellitus with other specified complication: Secondary | ICD-10-CM

## 2018-09-09 DIAGNOSIS — I10 Essential (primary) hypertension: Secondary | ICD-10-CM

## 2018-09-09 DIAGNOSIS — R200 Anesthesia of skin: Secondary | ICD-10-CM

## 2018-09-09 DIAGNOSIS — M21379 Foot drop, unspecified foot: Secondary | ICD-10-CM

## 2018-09-09 DIAGNOSIS — E785 Hyperlipidemia, unspecified: Secondary | ICD-10-CM

## 2018-09-09 DIAGNOSIS — I4891 Unspecified atrial fibrillation: Secondary | ICD-10-CM

## 2018-09-09 LAB — LIPID PANEL
CHOLESTEROL: 166 mg/dL (ref 0–200)
HDL: 33.5 mg/dL — ABNORMAL LOW (ref >39.00)
LDL CALC: 108 mg/dL — AB (ref 0–99)
NonHDL: 132.41
Total CHOL/HDL Ratio: 5
Triglycerides: 120 mg/dL (ref 0.0–149.0)
VLDL: 24 mg/dL (ref 0.0–40.0)

## 2018-09-09 LAB — CBC WITH DIFFERENTIAL/PLATELET
BASOS PCT: 0.7 % (ref 0.0–3.0)
Basophils Absolute: 0 10*3/uL (ref 0.0–0.1)
EOS PCT: 2.4 % (ref 0.0–5.0)
Eosinophils Absolute: 0.1 10*3/uL (ref 0.0–0.7)
HCT: 32.6 % — ABNORMAL LOW (ref 39.0–52.0)
HEMOGLOBIN: 10.7 g/dL — AB (ref 13.0–17.0)
LYMPHS ABS: 1.1 10*3/uL (ref 0.7–4.0)
Lymphocytes Relative: 19.7 % (ref 12.0–46.0)
MCHC: 33 g/dL (ref 30.0–36.0)
MCV: 100.5 fl — ABNORMAL HIGH (ref 78.0–100.0)
MONO ABS: 0.6 10*3/uL (ref 0.1–1.0)
Monocytes Relative: 10.9 % (ref 3.0–12.0)
NEUTROS ABS: 3.7 10*3/uL (ref 1.4–7.7)
NEUTROS PCT: 66.3 % (ref 43.0–77.0)
PLATELETS: 216 10*3/uL (ref 150.0–400.0)
RBC: 3.24 Mil/uL — ABNORMAL LOW (ref 4.22–5.81)
RDW: 17 % — AB (ref 11.5–15.5)
WBC: 5.6 10*3/uL (ref 4.0–10.5)

## 2018-09-09 LAB — COMPREHENSIVE METABOLIC PANEL
ALT: 12 U/L (ref 0–53)
AST: 12 U/L (ref 0–37)
Albumin: 3.8 g/dL (ref 3.5–5.2)
Alkaline Phosphatase: 88 U/L (ref 39–117)
BILIRUBIN TOTAL: 0.7 mg/dL (ref 0.2–1.2)
BUN: 19 mg/dL (ref 6–23)
CO2: 25 meq/L (ref 19–32)
Calcium: 8.5 mg/dL (ref 8.4–10.5)
Chloride: 105 mEq/L (ref 96–112)
Creatinine, Ser: 1.11 mg/dL (ref 0.40–1.50)
GFR: 67.19 mL/min (ref >60.00)
GLUCOSE: 144 mg/dL — AB (ref 70–99)
Potassium: 4.5 mEq/L (ref 3.5–5.1)
Sodium: 139 mEq/L (ref 135–145)
Total Protein: 6.9 g/dL (ref 6.0–8.3)

## 2018-09-09 LAB — HEMOGLOBIN A1C: HEMOGLOBIN A1C: 7 % — AB (ref 4.6–6.5)

## 2018-09-09 LAB — VITAMIN B12: VITAMIN B 12: 156 pg/mL — AB (ref 211–911)

## 2018-09-09 MED ORDER — AMLODIPINE BESYLATE 5 MG PO TABS: 5.0000 mg | ORAL_TABLET | Freq: Every day | ORAL | 3 refills | Status: AC

## 2018-09-09 MED ORDER — METOPROLOL TARTRATE 50 MG PO TABS: 50.0000 mg | ORAL_TABLET | Freq: Two times a day (BID) | ORAL | 3 refills | Status: DC

## 2018-09-09 MED ORDER — FUROSEMIDE 20 MG PO TABS: 20.0000 mg | ORAL_TABLET | Freq: Every day | ORAL | 3 refills | Status: AC

## 2018-09-09 MED ORDER — METFORMIN HCL 1000 MG PO TABS: 1000.0000 mg | ORAL_TABLET | Freq: Two times a day (BID) | ORAL | 3 refills | Status: AC

## 2018-09-09 MED ORDER — GABAPENTIN 100 MG PO CAPS: 200.0000 mg | ORAL_CAPSULE | Freq: Every day | ORAL | 0 refills | Status: DC

## 2018-09-09 MED ORDER — ATORVASTATIN CALCIUM 40 MG PO TABS: 40.0000 mg | ORAL_TABLET | Freq: Every day | ORAL | 3 refills | Status: AC

## 2018-09-09 NOTE — Patient Instructions (Addendum)
Continue doing brain stimulating activities (puzzles, reading, adult coloring books, staying active) to keep memory sharp.   Continue to eat heart healthy diet (full of fruits, vegetables, whole grains, lean protein, water--limit salt, fat, and sugar intake) and increase physical activity as tolerated.   Health Maintenance, Male A healthy lifestyle and preventive care is important for your health and wellness. Ask your health care provider about what schedule of regular examinations is right for you. What should I know about weight and diet? Eat a Healthy Diet  Eat plenty of vegetables, fruits, whole grains, low-fat dairy products, and lean protein.  Do not eat a lot of foods high in solid fats, added sugars, or salt.  Maintain a Healthy Weight Regular exercise can help you achieve or maintain a healthy weight. You should:  Do at least 150 minutes of exercise each week. The exercise should increase your heart rate and make you sweat (moderate-intensity exercise).  Do strength-training exercises at least twice a week.  Watch Your Levels of Cholesterol and Blood Lipids  Have your blood tested for lipids and cholesterol every 5 years starting at 82 years of age. If you are at high risk for heart disease, you should start having your blood tested when you are 82 years old. You may need to have your cholesterol levels checked more often if: ? Your lipid or cholesterol levels are high. ? You are older than 82 years of age. ? You are at high risk for heart disease.  What should I know about cancer screening? Many types of cancers can be detected early and may often be prevented. Lung Cancer  You should be screened every year for lung cancer if: ? You are a current smoker who has smoked for at least 30 years. ? You are a former smoker who has quit within the past 15 years.  Talk to your health care provider about your screening options, when you should start screening, and how often you  should be screened.  Colorectal Cancer  Routine colorectal cancer screening usually begins at 82 years of age and should be repeated every 5-10 years until you are 82 years old. You may need to be screened more often if early forms of precancerous polyps or small growths are found. Your health care provider may recommend screening at an earlier age if you have risk factors for colon cancer.  Your health care provider may recommend using home test kits to check for hidden blood in the stool.  A small camera at the end of a tube can be used to examine your colon (sigmoidoscopy or colonoscopy). This checks for the earliest forms of colorectal cancer.  Prostate and Testicular Cancer  Depending on your age and overall health, your health care provider may do certain tests to screen for prostate and testicular cancer.  Talk to your health care provider about any symptoms or concerns you have about testicular or prostate cancer.  Skin Cancer  Check your skin from head to toe regularly.  Tell your health care provider about any new moles or changes in moles, especially if: ? There is a change in a mole's size, shape, or color. ? You have a mole that is larger than a pencil eraser.  Always use sunscreen. Apply sunscreen liberally and repeat throughout the day.  Protect yourself by wearing long sleeves, pants, a wide-brimmed hat, and sunglasses when outside.  What should I know about heart disease, diabetes, and high blood pressure?  If you are 18-39   years of age, have your blood pressure checked every 3-5 years. If you are 40 years of age or older, have your blood pressure checked every year. You should have your blood pressure measured twice-once when you are at a hospital or clinic, and once when you are not at a hospital or clinic. Record the average of the two measurements. To check your blood pressure when you are not at a hospital or clinic, you can use: ? An automated blood pressure  machine at a pharmacy. ? A home blood pressure monitor.  Talk to your health care provider about your target blood pressure.  If you are between 45-79 years old, ask your health care provider if you should take aspirin to prevent heart disease.  Have regular diabetes screenings by checking your fasting blood sugar level. ? If you are at a normal weight and have a low risk for diabetes, have this test once every three years after the age of 45. ? If you are overweight and have a high risk for diabetes, consider being tested at a younger age or more often.  A one-time screening for abdominal aortic aneurysm (AAA) by ultrasound is recommended for men aged 65-75 years who are current or former smokers. What should I know about preventing infection? Hepatitis B If you have a higher risk for hepatitis B, you should be screened for this virus. Talk with your health care provider to find out if you are at risk for hepatitis B infection. Hepatitis C Blood testing is recommended for:  Everyone born from 1945 through 1965.  Anyone with known risk factors for hepatitis C.  Sexually Transmitted Diseases (STDs)  You should be screened each year for STDs including gonorrhea and chlamydia if: ? You are sexually active and are younger than 82 years of age. ? You are older than 82 years of age and your health care provider tells you that you are at risk for this type of infection. ? Your sexual activity has changed since you were last screened and you are at an increased risk for chlamydia or gonorrhea. Ask your health care provider if you are at risk.  Talk with your health care provider about whether you are at high risk of being infected with HIV. Your health care provider may recommend a prescription medicine to help prevent HIV infection.  What else can I do?  Schedule regular health, dental, and eye exams.  Stay current with your vaccines (immunizations).  Do not use any tobacco products,  such as cigarettes, chewing tobacco, and e-cigarettes. If you need help quitting, ask your health care provider.  Limit alcohol intake to no more than 2 drinks per day. One drink equals 12 ounces of beer, 5 ounces of Laticha Ferrucci, or 1 ounces of hard liquor.  Do not use street drugs.  Do not share needles.  Ask your health care provider for help if you need support or information about quitting drugs.  Tell your health care provider if you often feel depressed.  Tell your health care provider if you have ever been abused or do not feel safe at home. This information is not intended to replace advice given to you by your health care provider. Make sure you discuss any questions you have with your health care provider. Document Released: 03/30/2008 Document Revised: 05/31/2016 Document Reviewed: 07/06/2015 Elsevier Interactive Patient Education  2018 [employer] Inc.  

## 2018-09-09 NOTE — Progress Notes (Addendum)
Subjective:   Colin Newman is a 84 y.o. male who presents for an Initial Medicare Annual Wellness Visit.  Review of Systems  No ROS.  Medicare Wellness Visit. Additional risk factors are reflected in the social history.  Cardiac Risk Factors include: diabetes mellitus;dyslipidemia;hypertension;male gender Sleep patterns: gets up 1-2 times nightly to void and sleeps 7-8 hours nightly.    Home Safety/Smoke Alarms: Feels safe in home. Smoke alarms in place.  Living environment; residence and Firearm Safety: 1-story house/ trailer, no firearms.Lives alone, no needs for DME, good support system Seat Belt Safety/Bike Helmet: Wears seat belt.     Objective:    Today's Vitals   09/09/18 0927  BP: (!) 142/61  Pulse: 61  Resp: 17  Temp: 98.1 F (36.7 C)  SpO2: 99%  Weight: 223 lb (101.2 kg)  Height: 5\' 11"  (1.803 m)   Body mass index is 31.1 kg/m.  Advanced Directives 09/09/2018 07/09/2018 04/09/2018 06/15/2017 05/10/2017 04/06/2017 03/30/2017  Does Patient Have a Medical Advance Directive? Yes No Yes Yes Yes Yes Yes  Type of Paramedic of [place];Living will - [place];Living will [place];Living will [place];Living will [place] will  Does patient want to make changes to medical advance directive? - - - No - Patient declined - - No - Patient declined  Copy of [place] in Chart? No - copy requested - No - copy requested No - copy requested No - copy requested No - copy requested -  Would patient like information on creating a medical advance directive? - No - Patient declined - - - - -    Current Medications (verified) Outpatient Encounter Medications as of 09/09/2018  Medication Sig  . Multiple Vitamin (MULTI-VITAMIN DAILY PO) Take 1 tablet by mouth daily.   . nitroGLYCERIN (NITROSTAT) 0.4 MG SL tablet DISSOLVE ONE TABLET UNDER THE TONGUE EVERY 5  MINUTES AS NEEDED FOR CHEST PAIN.  DO NOT EXCEED A TOTAL OF 3 DOSES IN 15 MINUTES (Patient taking differently: Place 0.4 mg under the tongue every 5 (five) minutes as needed for chest pain. )  . Omega-3 Fatty Acids (FISH OIL) 1200 MG CAPS Take 1,200 mg by mouth daily.   [name] warfarin (COUMADIN) 5 MG tablet TAKE 1 TABLET BY MOUTH ONCE DAILY OR AS DIRECTED BY COUMADIN CLINIC  . [DISCONTINUED] amLODipine (NORVASC) 5 MG tablet Take 1 tablet (5 mg total) by mouth daily.  . [DISCONTINUED] atorvastatin (LIPITOR) 40 MG tablet Take 1 tablet (40 mg total) by mouth daily.  . [DISCONTINUED] furosemide (LASIX) 20 MG tablet Take 1 tablet (20 mg total) by mouth daily.  . [DISCONTINUED] metFORMIN (GLUCOPHAGE) 1000 MG tablet Take 1 tablet (1,000 mg total) by mouth 2 (two) times daily.  . [DISCONTINUED] metoprolol tartrate (LOPRESSOR) 50 MG tablet Take 1 tablet (50 mg total) by mouth 2 (two) times daily.   No facility-administered encounter medications on file as of 09/09/2018.     Allergies (verified) Lisinopril; Losartan; and Hctz [hydrochlorothiazide]   History: Past Medical History:  Diagnosis Date  . Constipation   . Diabetes mellitus type 2, noninsulin dependent ([place])       . Dyslipidemia    Hx with intolerance to statin therapy  . Hypertension   . Obesity   . Single vessel coronary disease    s/p stent LAD 2005   Past Surgical History:  Procedure Laterality Date  . BACK SURGERY  2 previous  . BILATERAL KNEE ARTHROSCOPY    . CARDIOVASCULAR STRESS TEST  07-20-2009   EF 73%  . CARDIOVERSION N/A 07/09/2018   Procedure: CARDIOVERSION;  Surgeon: Sanda Klein, MD;  Location: [facility];  Service: Cardiovascular;  Laterality: N/A;  . CORONARY ANGIOPLASTY WITH STENT PLACEMENT  2005  . ESOPHAGOGASTRODUODENOSCOPY N/A 04/09/2018   Procedure: ESOPHAGOGASTRODUODENOSCOPY (EGD);  Surgeon: [name], MD;  Location: [name];  Service: Endoscopy;  Laterality: N/A;  . FINGER SURGERY Right 2018    right 5th finger  . HOT HEMOSTASIS N/A 04/09/2018   Procedure: HOT HEMOSTASIS (ARGON PLASMA COAGULATION/BICAP);  Surgeon: [name], MD;  Location: [name];  Service: Endoscopy;  Laterality: N/A;  . OTHER SURGICAL HISTORY     Stent in the proximal left anterior descending in 2005   Family History  Problem Relation Age of Onset  . Pulmonary embolism Mother   . Cancer Father    Social History   Socioeconomic History  . Marital status: Widowed    Spouse name: Not on file  . Number of children: 3  . Years of education: Not on file  . Highest education level: Not on file  Occupational History  . 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 Financial resource strain: Not hard at all  . Food insecurity:    Worry: Never true    Inability: Never true  . Transportation needs:    Medical: No    Non-medical: No  Tobacco Use  . Smoking status: Former Research scientist (life sciences)  . Smokeless tobacco: Current User    Types: Chew  Substance and Sexual Activity  . Alcohol use: Yes    Alcohol/week: 0.0 standard drinks    Comment: Rarely has a beer  . Drug use: Never  . Sexual activity: Not Currently  Lifestyle  . Physical activity:    Days per week: 0 days    Minutes per session: 0 min  . Stress: Not at all  Relationships  . Social connections:    Talks on phone: More than three times a week    Gets together: More than three times a week    Attends religious service: More than 4 times per year    Active member of club or organization: Yes    Attends meetings of clubs or organizations: More than 4 times per year    Relationship status: Widowed  Other Topics Concern  . Not on file  Social History Narrative  . Not on file   Tobacco Counseling Ready to quit: Not Answered Counseling given: Not Answered  Activities of Daily Living In your present state of health, do you have any difficulty performing the following activities: 09/09/2018  Hearing? N  Vision? N    Difficulty concentrating or making decisions? N  Walking or climbing stairs? N  Dressing or bathing? N  Doing errands, shopping? N  Preparing Food and eating ? N  Using the Toilet? N  In the past six months, have you accidently leaked urine? N  Do you have problems with loss of bowel control? N  Managing your Medications? N  Managing your Finances? N  Housekeeping or managing your Housekeeping? N  Some recent data might be hidden     Immunizations and Health Maintenance Immunization History  Administered Date(s) Administered  . Influenza Whole 08/21/2007  . Pneumococcal Conjugate-13 07/21/2015  . Pneumococcal Polysaccharide-23 11/06/2016   Health Maintenance Due  Topic Date Due  . OPHTHALMOLOGY EXAM  [birth year]27[birth year]1945  .  TETANUS/TDAP  05/27/1954  . URINE MICROALBUMIN  11/06/2017  . FOOT EXAM  05/10/2018  . INFLUENZA VACCINE  05/16/2018    Patient Care Team: [name], FNP as PCP - General (Internal Medicine)  Indicate any recent Medical Services you may have received from other than [facility] providers in the past year (date may be approximate).    Assessment:   This is a routine wellness examination for [place]. Physical assessment deferred to PCP.   Hearing/Vision screen Hearing Screening Comments: Able to hear conversational tones w/o difficulty. Passed whisper test Vision Screening Comments: appointment yearly Dr. Gloriann Loan  Dietary issues and exercise activities discussed: Current Exercise Habits: The patient does not participate in regular exercise at present, Exercise limited by: orthopedic condition(s)  Diet (meal preparation, eat out, water intake, caffeinated beverages, dairy products, fruits and vegetables): in general, a "healthy" diet  , well balanced Reports that he skips meals at times.   Reviewed heart healthy and diabetic diet. Encouraged patient to increase daily water and healthy fluid intake.  Discussed supplementing with glucerna, samples and coupons  provided.  Goals    . Blood Pressure < 140/90    . HEMOGLOBIN A1C < 7.0      Depression Screen PHQ 2/9 Scores 09/09/2018 06/15/2017 05/10/2017 04/06/2017  PHQ - 2 Score 1 0 0 0    Fall Risk Fall Risk  09/09/2018 06/15/2017 05/10/2017 04/06/2017 03/30/2017  Falls in the past year? - Yes Yes Yes Yes  Number falls in past yr: 0 1 1 1 1   Injury with Fall? - Yes Yes Yes Yes  Risk for fall due to : Impaired balance/gait;Impaired mobility - - - -  Follow up Falls prevention discussed;Education provided Education provided;Falls prevention discussed Falls prevention discussed - Falls prevention discussed   Cognitive Function: MMSE - Mini Mental State Exam 09/09/2018  Orientation to time 5  Orientation to Place 5  Registration 3  Attention/ Calculation 5  Recall 1  Language- name 2 objects 2  Language- repeat 1  Language- follow 3 step command 3  Language- read & follow direction 1  Write a sentence 1  Copy design 1  Total score 28        Screening Tests Health Maintenance  Topic Date Due  . OPHTHALMOLOGY EXAM  05/27/1945  . TETANUS/TDAP  05/27/1954  . URINE MICROALBUMIN  11/06/2017  . FOOT EXAM  05/10/2018  . INFLUENZA VACCINE  05/16/2018  . HEMOGLOBIN A1C  09/14/2018  . PNA vac Low Risk Adult  Completed      Plan:   Continue doing brain stimulating activities (puzzles, reading, adult coloring books, staying active) to keep memory sharp.   Continue to eat heart healthy diet (full of fruits, vegetables, whole grains, lean protein, water--limit salt, fat, and sugar intake) and increase physical activity as tolerated.  I have personally reviewed and noted the following in the patient's chart:   . Medical and social history . Use of alcohol, tobacco or illicit drugs  . Current medications and supplements . Functional ability and status . Nutritional status . Physical activity . Advanced directives . List of other physicians . Vitals . Screenings to include cognitive,  depression, and falls . Referrals and appointments  In addition, I have reviewed and discussed with patient certain preventive protocols, quality metrics, and best practice recommendations. A written personalized care plan for preventive services as well as general preventive health recommendations were provided to patient.     Michiel Cowboy, RN   09/09/2018  Medical screening examination/treatment/procedure(s) were performed by non-physician practitioner and as supervising provider I was immediately available for consultation/collaboration.  I agree with above. [name], FNP

## 2018-09-09 NOTE — Progress Notes (Signed)
Colin Newman is a 84 y.o. male with the following history as recorded in Epic[facility]:  Patient Active Problem List   Diagnosis Date Noted  . Atrial fibrillation ([place]) 04/15/2018  . Long term (current) use of anticoagulants 04/15/2018  . Colon polyps 04/12/2017  . Nephropathy 11/10/2016  . Mobitz type 1 second degree atrioventricular block 02/07/2016  . Pre-ulcerative calluses 03/16/2015  . Right foot drop 03/16/2015  . CAD (coronary artery disease), native coronary artery 09/17/2014  . Eustachian tube dysfunction 10/28/2013  . Hearing loss 10/28/2013  . Obesity (BMI 30.0-34.9) 08/30/2012  . Chest wall pain 08/21/2012  . Drug rash 11/21/2011  . Dyslipidemia 07/26/2011  . Type 2 diabetes mellitus ([place]) 06/23/2011  . ACTINIC KERATOSIS 06/11/2008  . ERECTILE DYSFUNCTION 05/21/2008  . CORONARY ARTERY DISEASE, S/P PTCA 02/06/2008  . Essential hypertension 12/13/2006  . HEMORRHOIDS, NOS 12/13/2006  . BPH 12/13/2006  . OSTEOARTHRITIS, LOWER LEG 12/13/2006  . BACK PAIN, LOW 12/13/2006    Current Outpatient Medications  Medication Sig Dispense Refill  . amLODipine (NORVASC) 5 MG tablet Take 1 tablet (5 mg total) by mouth daily. 90 tablet 3  . atorvastatin (LIPITOR) 40 MG tablet Take 1 tablet (40 mg total) by mouth daily. 90 tablet 3  . furosemide (LASIX) 20 MG tablet Take 1 tablet (20 mg total) by mouth daily. 90 tablet 3  . metFORMIN (GLUCOPHAGE) 1000 MG tablet Take 1 tablet (1,000 mg total) by mouth 2 (two) times daily. 180 tablet 3  . metoprolol tartrate (LOPRESSOR) 50 MG tablet Take 1 tablet (50 mg total) by mouth 2 (two) times daily. 180 tablet 3  . Multiple Vitamin (MULTI-VITAMIN DAILY PO) Take 1 tablet by mouth daily.     . nitroGLYCERIN (NITROSTAT) 0.4 MG SL tablet DISSOLVE ONE TABLET UNDER THE TONGUE EVERY 5 MINUTES AS NEEDED FOR CHEST PAIN.  DO NOT EXCEED A TOTAL OF 3 DOSES IN 15 MINUTES (Patient taking differently: Place 0.4 mg under the tongue every 5 (five) minutes as needed  for chest pain. ) 10 tablet 0  . Omega-3 Fatty Acids (FISH OIL) 1200 MG CAPS Take 1,200 mg by mouth daily.     [name] warfarin (COUMADIN) 5 MG tablet TAKE 1 TABLET BY MOUTH ONCE DAILY OR AS DIRECTED BY COUMADIN CLINIC 90 tablet 1  . gabapentin (NEURONTIN) 100 MG capsule Take 2 capsules (200 mg total) by mouth at bedtime. 180 capsule 0   No current facility-administered medications for this visit.     Allergies: Lisinopril; Losartan; and Hctz [hydrochlorothiazide]  Past Medical History:  Diagnosis Date  . Constipation   . Diabetes mellitus type 2, noninsulin dependent ([place])       . Dyslipidemia    Hx with intolerance to statin therapy  . Hypertension   . Obesity   . Single vessel coronary disease    s/p stent LAD 2005    Past Surgical History:  Procedure Laterality Date  . BACK SURGERY     2 previous  . BILATERAL KNEE ARTHROSCOPY    . CARDIOVASCULAR STRESS TEST  07-20-2009   EF 73%  . CARDIOVERSION N/A 07/09/2018   Procedure: CARDIOVERSION;  Surgeon: Sanda Klein, MD;  Location: [facility];  Service: Cardiovascular;  Laterality: N/A;  . CORONARY ANGIOPLASTY WITH STENT PLACEMENT  2005  . ESOPHAGOGASTRODUODENOSCOPY N/A 04/09/2018   Procedure: ESOPHAGOGASTRODUODENOSCOPY (EGD);  Surgeon: [name], MD;  Location: [name];  Service: Endoscopy;  Laterality: N/A;  . FINGER SURGERY Right 2018   right 5th finger  . HOT  HEMOSTASIS N/A 04/09/2018   Procedure: HOT HEMOSTASIS (ARGON PLASMA COAGULATION/BICAP);  Surgeon: [name], MD;  Location: [name];  Service: Endoscopy;  Laterality: N/A;  . OTHER SURGICAL HISTORY     Stent in the proximal left anterior descending in 2005    Family History  Problem Relation Age of Onset  . Pulmonary embolism Mother   . Cancer Father     Social History   Tobacco Use  . Smoking status: Former Research scientist (life sciences)  . Smokeless tobacco: Current User    Types: Chew  Substance Use Topics  . Alcohol use: Yes    Alcohol/week: 0.0 standard drinks     Comment: Rarely has a beer    Subjective:  6 month follow-up on chronic care needs; accompanied by daughter and son; had AWV with RN in our office earlier today; Feeling better recently- not having as much dizziness; Denies any chest pain, shortness of breath, blurred vision or headache. Requesting refills on medications- would like to discuss treatment options for neuropathy, physical therapy referral; okay to update labs today;  Got flu shot this morning;     Objective:  Vitals:   09/09/18 1646  BP: (!) 142/61  Pulse: 61  Resp: 17  Temp: 98.1 F (36.7 C)  SpO2: 99%    General: Well developed, well nourished, in no acute distress  Skin : Warm and dry.  Head: Normocephalic and atraumatic  Eyes: Sclera and conjunctiva clear; pupils round and reactive to light; extraocular movements intact  Ears: External normal; canals clear; tympanic membranes normal  Oropharynx: Pink, supple. No suspicious lesions  Neck: Supple without thyromegaly, adenopathy  Lungs: Respirations unlabored; clear to auscultation bilaterally without wheeze, rales, rhonchi  CVS exam: irregularly irregular rhythm   Abdomen: Soft; nontender; nondistended; normoactive bowel sounds; no masses or hepatosplenomegaly  Musculoskeletal: No deformities; no active joint inflammation  Extremities: pedal edema, cyanosis, clubbing  Vessels: Symmetric bilaterally  Neurologic: Alert and oriented; speech intact; face symmetrical; moves all extremities well; CNII-XII intact without focal deficit  Assessment:  1. Type 2 diabetes mellitus with other specified complication, unspecified whether long term insulin use ([place])   2. Neuropathy   3. Foot-drop, unspecified laterality   4. Numbness and tingling   5. Essential hypertension   6. Dyslipidemia   7. Atrial fibrillation, unspecified type ([place])     Plan:  1. Check Hgba1c; continue same medication- refill updated; 2. Trial of Gabapentin 200 mg qhs; follow-up to be determined;   3. Chronic problem- refer to PT- evaluate and treat; 4. Check B12 level today; 5. Stable; continue same medication; 6. Stable; refill updated; check lipid panel; 7. Continue with cardiology; have asked him to reach out to verify if he is supposed to be taking Lasix daily;   Return in about 6 months (around 03/10/2019).  Orders Placed This Encounter  Procedures  . Comp Met (CMET)    Standing Status:   Future    Number of Occurrences:   1    Standing Expiration Date:   09/09/2019  . HgB A1c    Standing Status:   Future    Number of Occurrences:   1    Standing Expiration Date:   09/09/2019  . B12    Standing Status:   Future    Number of Occurrences:   1    Standing Expiration Date:   09/09/2019  . Lipid panel    Standing Status:   Future    Number of Occurrences:   1  Standing Expiration Date:   09/10/2019  . CBC w/Diff    Standing Status:   Future    Number of Occurrences:   1    Standing Expiration Date:   09/09/2019  . Ambulatory referral to Physical Therapy    Referral Priority:   Routine    Referral Type:   Physical Medicine    Referral Reason:   Specialty Services Required    Requested Specialty:   Physical Therapy    Number of Visits Requested:   1    Requested Prescriptions   Signed Prescriptions Disp Refills  . amLODipine (NORVASC) 5 MG tablet 90 tablet 3    Sig: Take 1 tablet (5 mg total) by mouth daily.  [name] atorvastatin (LIPITOR) 40 MG tablet 90 tablet 3    Sig: Take 1 tablet (40 mg total) by mouth daily.  . furosemide (LASIX) 20 MG tablet 90 tablet 3    Sig: Take 1 tablet (20 mg total) by mouth daily.  . metFORMIN (GLUCOPHAGE) 1000 MG tablet 180 tablet 3    Sig: Take 1 tablet (1,000 mg total) by mouth 2 (two) times daily.  . metoprolol tartrate (LOPRESSOR) 50 MG tablet 180 tablet 3    Sig: Take 1 tablet (50 mg total) by mouth 2 (two) times daily.  [name] gabapentin (NEURONTIN) 100 MG capsule 180 capsule 0    Sig: Take 2 capsules (200 mg total) by mouth at  bedtime.

## 2018-09-10 ENCOUNTER — Ambulatory Visit: Payer: Self-pay | Admitting: *Deleted

## 2018-09-10 NOTE — Telephone Encounter (Signed)
Spoke with son and info given. 

## 2018-09-10 NOTE — Telephone Encounter (Signed)
Actually, let's hold the Gabapentin for now so I can determine is response the B12 first; thanks for calling to get that clarified.

## 2018-09-10 NOTE — Telephone Encounter (Signed)
Summary: please advise   Pt son Colin Newman is calling and would like to know since his dad will be take b12 injection should he still take gabapentin laura murray prescribed yesterday     Patient is to start on B12 injections- he was given Rx for Gabapentin yesterday- should he start that or wait. Son is calling to inquire- forgot to ask.

## 2018-09-11 ENCOUNTER — Ambulatory Visit (INDEPENDENT_AMBULATORY_CARE_PROVIDER_SITE_OTHER): Payer: Medicare Other | Admitting: Pharmacist Clinician (PhC)/ Clinical Pharmacy Specialist

## 2018-09-11 DIAGNOSIS — Z7901 Long term (current) use of anticoagulants: Secondary | ICD-10-CM | POA: Diagnosis not present

## 2018-09-11 DIAGNOSIS — I4891 Unspecified atrial fibrillation: Secondary | ICD-10-CM

## 2018-09-11 LAB — POCT INR: INR: 1.6 — AB (ref 2.0–3.0)

## 2018-09-11 NOTE — Patient Instructions (Signed)
Description   Take 1.5 tablets today Wednesday Nov 27, then increase dose to 1 tablet daily except 1/2 tablet on Sundays. Repeat INR in 2 weeks

## 2018-09-19 ENCOUNTER — Ambulatory Visit (INDEPENDENT_AMBULATORY_CARE_PROVIDER_SITE_OTHER): Payer: Medicare Other

## 2018-09-19 DIAGNOSIS — E538 Deficiency of other specified B group vitamins: Secondary | ICD-10-CM

## 2018-09-19 MED ORDER — CYANOCOBALAMIN 1000 MCG/ML IJ SOLN
1000.0000 ug | Freq: Once | INTRAMUSCULAR | Status: AC
  Administered 2018-09-19: 1000 ug via INTRAMUSCULAR

## 2018-09-19 NOTE — Progress Notes (Signed)
I have reviewed and agree.

## 2018-09-23 ENCOUNTER — Encounter: Payer: Self-pay | Admitting: Physical Therapy

## 2018-09-23 ENCOUNTER — Ambulatory Visit: Payer: Medicare Other | Attending: Family | Admitting: Physical Therapy

## 2018-09-23 DIAGNOSIS — R262 Difficulty in walking, not elsewhere classified: Secondary | ICD-10-CM | POA: Insufficient documentation

## 2018-09-23 DIAGNOSIS — M6281 Muscle weakness (generalized): Secondary | ICD-10-CM | POA: Insufficient documentation

## 2018-09-23 DIAGNOSIS — R2689 Other abnormalities of gait and mobility: Secondary | ICD-10-CM | POA: Insufficient documentation

## 2018-09-23 NOTE — Therapy (Signed)
[facility] [address], [place], [place], [address] Phone: [phone]   Fax:  [phone]  Physical Therapy Evaluation  Patient Details  Name: Colin Newman MRN: [id number] Date of Birth: 1933/10/15 Referring Provider (PT): Marvis Repress, FNP   Encounter Date: 09/23/2018  PT End of Session - 09/23/18 1115    Visit Number  1    Number of Visits  16    PT Start Time  2297    PT Stop Time  1145    PT Time Calculation (min)  42 min    Activity Tolerance  Patient tolerated treatment well    Behavior During Therapy  [facility] for tasks assessed/performed       Past Medical History:  Diagnosis Date  . Constipation   . Diabetes mellitus type 2, noninsulin dependent ([place])       . Dyslipidemia    Hx with intolerance to statin therapy  . Hypertension   . Obesity   . Single vessel coronary disease    s/p stent LAD 2005    Past Surgical History:  Procedure Laterality Date  . BACK SURGERY     2 previous  . BILATERAL KNEE ARTHROSCOPY    . CARDIOVASCULAR STRESS TEST  07-20-2009   EF 73%  . CARDIOVERSION N/A 07/09/2018   Procedure: CARDIOVERSION;  Surgeon: Sanda Klein, MD;  Location: [place];  Service: Cardiovascular;  Laterality: N/A;  . CORONARY ANGIOPLASTY WITH STENT PLACEMENT  2005  . ESOPHAGOGASTRODUODENOSCOPY N/A 04/09/2018   Procedure: ESOPHAGOGASTRODUODENOSCOPY (EGD);  Surgeon: [name], MD;  Location: [name];  Service: Endoscopy;  Laterality: N/A;  . FINGER SURGERY Right 2018   right 5th finger  . HOT HEMOSTASIS N/A 04/09/2018   Procedure: HOT HEMOSTASIS (ARGON PLASMA COAGULATION/BICAP);  Surgeon: [name], MD;  Location: [name];  Service: Endoscopy;  Laterality: N/A;  . OTHER SURGICAL HISTORY     Stent in the proximal left anterior descending in 2005    There were no vitals filed for this visit.   Subjective Assessment - 09/23/18 1109    Subjective  Pt arriving to therapy  reporting one fall at home in his driveway but it was years ago. Pt reporting chronic foot drop on R LE. Pt reporting several LOB recently but no recent falls. Pt amb into clinic with straight cane with steppage gait pattern.     Pertinent History  Back surgeries x 2 > 15 years ago. Pt reporting dropped foot developed after his second back surgery., DM, A-fib, HTN, bilateral knee replacements (15 years ago)    Limitations  Walking;House hold activities;Standing    How long can you sit comfortably?  unlimited    How long can you stand comfortably?  5-10 minutes    How long can you walk comfortably?  5 minutes, pt reports walking better when he has something to hold onto, if not his legs fatigue.     Currently in Pain?  No/denies         [facility] PT Assessment - 09/23/18 0001      Assessment   Medical Diagnosis  gait instability, balance deficits    Referring Provider (PT)  Marvis Repress, FNP    Hand Dominance  Right    Prior Therapy  on his finger      Precautions   Precautions  None      Restrictions   Weight Bearing Restrictions  No      Balance Screen   Has the patient  fallen in the past 6 months  No    Is the patient reluctant to leave their home because of a fear of falling?   No      Home Environment   Living Environment  Private residence    Living Arrangements  Alone    Type of [place] to enter    Entrance Stairs-Number of Steps  [place] - single point      Prior Function   Level of [place]  Retired    Leisure  watch sports      Cognition   Overall Cognitive Status  Within Functional Limits for tasks assessed      ROM / Strength   AROM / PROM / Strength  Strength      Strength   Overall Strength Comments  grossly 4+/5 in bilateral hips and knees. Foot drop on R ankle and L ankle grossly 4/5.       Transfers   Five time sit to stand comments   14.46  seconds using UE support, unable to reach full hip extension      Ambulation/Gait   Gait Comments  Pt amb with R foot drop with steppage gait pattern. Pt with incresaed hip hiking on R LE with decreased bilateral foot clearence. Pt with bilateral knee flexed during stance phase.       Balance   Balance Assessed  Yes      Standardized Balance Assessment   Standardized Balance Assessment  Berg Balance Test      Berg Balance Test   Sit to Stand  Able to stand  independently using hands    Standing Unsupported  Able to stand safely 2 minutes    Sitting with Back Unsupported but Feet Supported on Floor or Stool  Able to sit safely and securely 2 minutes    Stand to Sit  Controls descent by using hands    Transfers  Able to transfer safely, definite need of hands    Standing Unsupported with Eyes Closed  Able to stand 3 seconds    Standing Ubsupported with Feet Together  Able to place feet together independently but unable to hold for 30 seconds    From Standing, Reach Forward with Outstretched Arm  Can reach forward >5 cm safely (2")    From Standing Position, Pick up Object from Floor  Unable to pick up shoe, but reaches 2-5 cm (1-2") from shoe and balances independently    From Standing Position, Turn to Look Behind Over each Shoulder  Turn sideways only but maintains balance    Turn 360 Degrees  Needs close supervision or verbal cueing    Standing Unsupported, Alternately Place Feet on Step/Stool  Able to complete >2 steps/needs minimal assist    Standing Unsupported, One Foot in Front  Able to take small step independently and hold 30 seconds    Standing on One Leg  Tries to lift leg/unable to hold 3 seconds but remains standing independently    Total Score  32                Objective measurements completed on examination: See above findings.              PT Education - 09/23/18 1145    Education Details  HEP, PT POC, BERG balance results  Person(s) Educated   Patient    Methods  Explanation;Handout;Demonstration    Comprehension  Verbalized understanding;Returned demonstration;Need further instruction       PT Short Term Goals - 09/23/18 1425      PT SHORT TERM GOAL #1   Title  Pt will be independent in his intial HEP    Time  3    Period  Weeks    Status  New    Target Date  10/14/18      PT SHORT TERM GOAL #2   Title  Pt will be able to perform sit to stand with NO UE support safely.     Time  4    Period  Weeks    Status  New    Target Date  10/21/18        PT Long Term Goals - 09/23/18 1420      PT LONG TERM GOAL #1   Title  Pt will improve his BERG balance score from 32/56 to >/= 40/56 in order to decrease fall risk and improve functional mobility.    Time  8    Period  Weeks    Status  New    Target Date  12/23/18      PT LONG TERM GOAL #2   Title  Pt will be able to perform 5 time sit to stand without use of UE's in </= 12 seconds.     Time  8    Period  --    Status  New    Target Date  12/16/18             Plan - 09/23/18 1440    Clinical Impression Statement  Pt presenting for PT evalution of his gait instability. Pt presenting with R foot drop and without brace. Pt reporting his current brace is lace up and he has difficulty donning and doffing. Pt with decreased bilateral knee extension with standing. Pt with history of bilaeral knee replacements. Pt currently amb with straight cane with hip hiking gait pattern on the R and bilateral decreased foot clearence.  Pt's BERG balance test was 32/56. Skilled PT needed to address pt's impairments with the below interventions.     History and Personal Factors relevant to plan of care:  h/o bilateral knee replacement > 10 years ago, A-fib, HTN,  Back surgeries x 2 with R foot drop    Clinical Presentation  Stable    Clinical Decision Making  Low    Rehab Potential  Good    PT Frequency  2x / week    PT Duration  8 weeks    PT Treatment/Interventions  ADLs/Self  Care Home Management;Balance training;Therapeutic exercise;Therapeutic activities;Functional mobility training;Stair training;Gait training;Neuromuscular re-education;Patient/family education;Passive range of motion;Manual techniques    PT Next Visit Plan  Evaluate pt's AFO and assistance required with donning and doffing, Measure knee extension,  progress standing balance exercises and gait    PT Home Exercise Plan  standing heel raises, hip abd, hip fleixon    Consulted and Agree with Plan of Care  Patient       Patient will benefit from skilled therapeutic intervention in order to improve the following deficits and impairments:  Decreased mobility, Postural dysfunction, Decreased activity tolerance, Decreased range of motion, Decreased strength, Impaired flexibility, Difficulty walking  Visit Diagnosis: Difficulty in walking, not elsewhere classified  Other abnormalities of gait and mobility  Muscle weakness (generalized)     Problem List Patient Active Problem List  Diagnosis Date Noted  . Atrial fibrillation ([place]) 04/15/2018  . Long term (current) use of anticoagulants 04/15/2018  . Colon polyps 04/12/2017  . Nephropathy 11/10/2016  . Mobitz type 1 second degree atrioventricular block 02/07/2016  . Pre-ulcerative calluses 03/16/2015  . Right foot drop 03/16/2015  . CAD (coronary artery disease), native coronary artery 09/17/2014  . Eustachian tube dysfunction 10/28/2013  . Hearing loss 10/28/2013  . Obesity (BMI 30.0-34.9) 08/30/2012  . Chest wall pain 08/21/2012  . Drug rash 11/21/2011  . Dyslipidemia 07/26/2011  . Type 2 diabetes mellitus ([place]) 06/23/2011  . ACTINIC KERATOSIS 06/11/2008  . ERECTILE DYSFUNCTION 05/21/2008  . CORONARY ARTERY DISEASE, S/P PTCA 02/06/2008  . Essential hypertension 12/13/2006  . HEMORRHOIDS, NOS 12/13/2006  . BPH 12/13/2006  . OSTEOARTHRITIS, LOWER LEG 12/13/2006  . BACK PAIN, LOW 12/13/2006    Oretha Caprice , PT 09/23/2018, 2:48  PM  [facility] [address] [place], [place], [address] Phone: [phone]   Fax:  [phone]  Name: Colin Newman MRN: [id number] Date of Birth: [birth year]

## 2018-09-25 ENCOUNTER — Ambulatory Visit: Payer: Medicare Other | Admitting: Physical Therapy

## 2018-09-25 ENCOUNTER — Encounter: Payer: Self-pay | Admitting: Physical Therapy

## 2018-09-25 VITALS — HR 89

## 2018-09-25 DIAGNOSIS — M6281 Muscle weakness (generalized): Secondary | ICD-10-CM | POA: Diagnosis not present

## 2018-09-25 DIAGNOSIS — R262 Difficulty in walking, not elsewhere classified: Secondary | ICD-10-CM | POA: Diagnosis not present

## 2018-09-25 DIAGNOSIS — R2689 Other abnormalities of gait and mobility: Secondary | ICD-10-CM | POA: Diagnosis not present

## 2018-09-25 NOTE — Therapy (Signed)
[facility] [address], [place], [place], [address] Phone: [phone]   Fax:  [phone]  Physical Therapy Treatment  Patient Details  Name: Colin Newman MRN: [id number] Date of Birth: [birth year] Referring Provider (PT): Marvis Repress, FNP   Encounter Date: 09/25/2018  PT End of Session - 09/25/18 1154    Visit Number  2    Number of Visits  16    PT Start Time  1130    PT Stop Time  1213    PT Time Calculation (min)  43 min    Activity Tolerance  Patient tolerated treatment well    Behavior During Therapy  [facility] for tasks assessed/performed       Past Medical History:  Diagnosis Date  . Constipation   . Diabetes mellitus type 2, noninsulin dependent ([place])       . Dyslipidemia    Hx with intolerance to statin therapy  . Hypertension   . Obesity   . Single vessel coronary disease    s/p stent LAD 2005    Past Surgical History:  Procedure Laterality Date  . BACK SURGERY     2 previous  . BILATERAL KNEE ARTHROSCOPY    . CARDIOVASCULAR STRESS TEST  07-20-2009   EF 73%  . CARDIOVERSION N/A 07/09/2018   Procedure: CARDIOVERSION;  Surgeon: Sanda Klein, MD;  Location: [facility];  Service: Cardiovascular;  Laterality: N/A;  . CORONARY ANGIOPLASTY WITH STENT PLACEMENT  2005  . ESOPHAGOGASTRODUODENOSCOPY N/A 04/09/2018   Procedure: ESOPHAGOGASTRODUODENOSCOPY (EGD);  Surgeon: [name], MD;  Location[name];  Service: Endoscopy;  Laterality: N/A;  . FINGER SURGERY Right 2018   right 5th finger  . HOT HEMOSTASIS N/A 04/09/2018   Procedure: HOT HEMOSTASIS (ARGON PLASMA COAGULATION/BICAP);  Surgeon: [name], MD;  Location[name];  Service: Endoscopy;  Laterality: N/A;  . OTHER SURGICAL HISTORY     Stent in the proximal left anterior descending in 2005    Vitals:   09/25/18 1137  Pulse: 89  SpO2: 96%    Subjective Assessment - 09/25/18 1130    Subjective  Pt arriving to  therapy without his AFO. Pt reporting he forgot. Pt reporting no pain today, but reports, "I just don't feel too good"    Pertinent History  Back surgeries x 2 > 15 years ago. Pt reporting dropped foot developed after his second back surgery., DM, A-fib, HTN, bilateral knee replacements (15 years ago)    Limitations  Walking;House hold activities;Standing    How long can you sit comfortably?  unlimited    How long can you stand comfortably?  5-10 minutes    How long can you walk comfortably?  5 minutes, pt reports walking better when he has something to hold onto, if not his legs fatigue.     Currently in Pain?  No/denies         [facility] PT Assessment - 09/25/18 0001      Assessment   Medical Diagnosis  gait instability, balance deficits    Referring Provider (PT)  Marvis Repress, FNP    Hand Dominance  Right    Prior Therapy  on his finger      Precautions   Precautions  None      ROM / Strength   AROM / PROM / Strength  AROM      AROM   AROM Assessment Site  Knee    Right/Left Knee  Right;Left    Right Knee Extension  8    Left Knee Extension  10      Special Tests    Special Tests  Hip Special Tests    Hip Special Tests   [facility] Test    Findings  Positive    Side  Right    Comments  Positive on Left                    [facility] Adult PT Treatment/Exercise - 09/25/18 0001      Exercises   Exercises  Knee/Hip      Knee/Hip Exercises: Stretches   Passive Hamstring Stretch  Right;Left;3 reps;30 seconds    Other Knee/Hip Stretches  heel cord stretch      Knee/Hip Exercises: Aerobic   Nustep  Level 3 x 8 minutes      Knee/Hip Exercises: Standing   Heel Raises  Both;15 reps    Hip Flexion  Stengthening;Both;2 sets;10 reps    Hip Abduction  Stengthening;Both;2 sets;10 reps    Other Standing Knee Exercises  step tapping with bilateral UE support x 20 reps      Knee/Hip Exercises: Seated   Ball Squeeze  x 15 reps    Other Seated Knee/Hip  Exercises  D1 diagnols with bilateral UE's using small blue ball non weighted for trunk rotation    Other Seated Knee/Hip Exercises  trunk rotation side to side with ball taps while holding the ball with bilateral UE's    Marching  Strengthening;2 sets;10 reps    Marching Limitations  on Airex mat    Sit to Sand  10 reps;with UE support      Knee/Hip Exercises: Supine   Quad Sets  AROM;Both    Quad Sets Limitations  with towel roll under ankle    Short Writer;Both    Straight Leg Raises  Strengthening;Both    Straight Leg Raises Limitations  L extensor lag               PT Short Term Goals - 09/25/18 1200      PT SHORT TERM GOAL #1   Title  Pt will be independent in his intial HEP    Time  3    Period  Weeks    Status  On-going      PT SHORT TERM GOAL #2   Title  Pt will be able to perform sit to stand with NO UE support safely.     Time  4    Period  Weeks    Status  New        PT Long Term Goals - 09/23/18 1420      PT LONG TERM GOAL #1   Title  Pt will improve his BERG balance score from 32/56 to >/= 40/56 in order to decrease fall risk and improve functional mobility.    Time  8    Period  Weeks    Status  New    Target Date  12/23/18      PT LONG TERM GOAL #2   Title  Pt will be able to perform 5 time sit to stand without use of UE's in </= 12 seconds.     Time  8    Period  --    Status  New    Target Date  12/16/18            Plan - 09/25/18 1216    Clinical Impression Statement  Pt presenting today with no pain reported. Pt did not bring his AFO today and was instructed to bring it at next visit to assess pt's ability to donn/doff. Pt requiring several rest breaks throughtout sesssion. Pt reporting congestion at beginning of session. Bilateral Upper Lobes with rhonchi noted, Pt's O2 sats were 96% on RA. Pt's lower lobes and R middle lobe were clear to asculation.  Pt instructed to perform deep breathing exericses to help  clearn congestion. Pt also instructed to follow up with his PCP.  Pt tolerating exericses well. Requring assistance wiht supine to sit and sit to supine. Pt's with bilateral knee extension deficits. Pt also with tightness in bilateral hips with positve Thomas Test. Continue skilled PT.     Rehab Potential  Good    PT Frequency  2x / week    PT Duration  8 weeks    PT Treatment/Interventions  ADLs/Self Care Home Management;Balance training;Therapeutic exercise;Therapeutic activities;Functional mobility training;Stair training;Gait training;Neuromuscular re-education;Patient/family education;Passive range of motion;Manual techniques    PT Next Visit Plan  Evaluate pt's AFO and assistance required with donning and doffing, Measure knee extension,  progress standing balance exercises and gait    PT Home Exercise Plan  standing heel raises, hip abd, hip fleixon    Consulted and Agree with Plan of Care  Patient       Patient will benefit from skilled therapeutic intervention in order to improve the following deficits and impairments:  Decreased mobility, Postural dysfunction, Decreased activity tolerance, Decreased range of motion, Decreased strength, Impaired flexibility, Difficulty walking  Visit Diagnosis: Difficulty in walking, not elsewhere classified  Other abnormalities of gait and mobility     Problem List Patient Active Problem List   Diagnosis Date Noted  . Atrial fibrillation ([place]) 04/15/2018  . Long term (current) use of anticoagulants 04/15/2018  . Colon polyps 04/12/2017  . Nephropathy 11/10/2016  . Mobitz type 1 second degree atrioventricular block 02/07/2016  . Pre-ulcerative calluses 03/16/2015  . Right foot drop 03/16/2015  . CAD (coronary artery disease), native coronary artery 09/17/2014  . Eustachian tube dysfunction 10/28/2013  . Hearing loss 10/28/2013  . Obesity (BMI 30.0-34.9) 08/30/2012  . Chest wall pain 08/21/2012  . Drug rash 11/21/2011  . Dyslipidemia  07/26/2011  . Type 2 diabetes mellitus ([place]) 06/23/2011  . ACTINIC KERATOSIS 06/11/2008  . ERECTILE DYSFUNCTION 05/21/2008  . CORONARY ARTERY DISEASE, S/P PTCA 02/06/2008  . Essential hypertension 12/13/2006  . HEMORRHOIDS, NOS 12/13/2006  . BPH 12/13/2006  . OSTEOARTHRITIS, LOWER LEG 12/13/2006  . BACK PAIN, LOW 12/13/2006    Oretha Caprice, PT 09/25/2018, 12:27 PM  [facility] [address] [place], [place], [address] Phone: [phone]   Fax:  [phone]  Name: EDMAN LIPSEY MRN: [id number] Date of Birth: 19-May-1933

## 2018-09-26 ENCOUNTER — Ambulatory Visit (INDEPENDENT_AMBULATORY_CARE_PROVIDER_SITE_OTHER): Payer: Medicare Other

## 2018-09-26 DIAGNOSIS — E538 Deficiency of other specified B group vitamins: Secondary | ICD-10-CM

## 2018-09-26 MED ORDER — CYANOCOBALAMIN 1000 MCG/ML IJ SOLN
1000.0000 ug | Freq: Once | INTRAMUSCULAR | Status: AC
  Administered 2018-09-26: 1000 ug via INTRAMUSCULAR

## 2018-09-26 NOTE — Progress Notes (Signed)
I have reviewed and agree.

## 2018-09-30 ENCOUNTER — Ambulatory Visit: Payer: Medicare Other | Admitting: Physical Therapy

## 2018-09-30 ENCOUNTER — Encounter: Payer: Self-pay | Admitting: Physical Therapy

## 2018-09-30 ENCOUNTER — Ambulatory Visit (INDEPENDENT_AMBULATORY_CARE_PROVIDER_SITE_OTHER): Payer: Medicare Other | Admitting: Pharmacist

## 2018-09-30 DIAGNOSIS — R262 Difficulty in walking, not elsewhere classified: Secondary | ICD-10-CM

## 2018-09-30 DIAGNOSIS — I4891 Unspecified atrial fibrillation: Secondary | ICD-10-CM

## 2018-09-30 DIAGNOSIS — R2689 Other abnormalities of gait and mobility: Secondary | ICD-10-CM

## 2018-09-30 DIAGNOSIS — M6281 Muscle weakness (generalized): Secondary | ICD-10-CM

## 2018-09-30 DIAGNOSIS — Z7901 Long term (current) use of anticoagulants: Secondary | ICD-10-CM

## 2018-09-30 LAB — POCT INR: INR: 2.5 (ref 2.0–3.0)

## 2018-09-30 NOTE — Therapy (Signed)
[facility] [address] [place], [place], [address] Phone: [phone]   Fax:  [phone]  Physical Therapy Treatment  Patient Details  Name: Colin Newman MRN: [id number] Date of Birth: 1933-03-22 Referring Provider (PT): Marvis Repress, FNP   Encounter Date: 09/30/2018  PT End of Session - 09/30/18 0933    Visit Number  3    Number of Visits  16    PT Start Time  0932    PT Stop Time  9983    PT Time Calculation (min)  42 min    Activity Tolerance  Patient tolerated treatment well    Behavior During Therapy  [facility] for tasks assessed/performed       Past Medical History:  Diagnosis Date  . Constipation   . Diabetes mellitus type 2, noninsulin dependent ([place])       . Dyslipidemia    Hx with intolerance to statin therapy  . Hypertension   . Obesity   . Single vessel coronary disease    s/p stent LAD 2005    Past Surgical History:  Procedure Laterality Date  . BACK SURGERY     2 previous  . BILATERAL KNEE ARTHROSCOPY    . CARDIOVASCULAR STRESS TEST  07-20-2009   EF 73%  . CARDIOVERSION N/A 07/09/2018   Procedure: CARDIOVERSION;  Surgeon: Sanda Klein, MD;  Location: [place];  Service: Cardiovascular;  Laterality: N/A;  . CORONARY ANGIOPLASTY WITH STENT PLACEMENT  2005  . ESOPHAGOGASTRODUODENOSCOPY N/A 04/09/2018   Procedure: ESOPHAGOGASTRODUODENOSCOPY (EGD);  Surgeon: [name], MD;  Location: [name];  Service: Endoscopy;  Laterality: N/A;  . FINGER SURGERY Right 2018   right 5th finger  . HOT HEMOSTASIS N/A 04/09/2018   Procedure: HOT HEMOSTASIS (ARGON PLASMA COAGULATION/BICAP);  Surgeon: [name], MD;  Location: [name];  Service: Endoscopy;  Laterality: N/A;  . OTHER SURGICAL HISTORY     Stent in the proximal left anterior descending in 2005    There were no vitals filed for this visit.  Subjective Assessment - 09/30/18 0934    Subjective  No new complaints.  breathing ok. No cane today, no AFO.    Pertinent History  Back surgeries x 2 > 15 years ago. Pt reporting dropped foot developed after his second back surgery., DM, A-fib, HTN, bilateral knee replacements (15 years ago)    Currently in Pain?  No/denies    Multiple Pain Sites  No                       [facility] Adult PT Treatment/Exercise - 09/30/18 0001      Neuro Re-ed    Neuro Re-ed Details   Standing tandem stance ( small step) side stepping at counter, single leg balance with opposite toe on floor.       Knee/Hip Exercises: Stretches   Active Hamstring Stretch  Both;3 reps;10 seconds   VC to hold stretch     Knee/Hip Exercises: Aerobic   Nustep  Level 3 x 8 minutes      Knee/Hip Exercises: Standing   Hip Abduction  Stengthening;Both;2 sets;10 reps      Knee/Hip Exercises: Seated   Long Arc Quad  AROM;Strengthening;Both;2 sets;10 reps    Other Seated Knee/Hip Exercises  red ball shoulder to shoulder for trunk rotation 10x bil    VC for trunk movement   Sit to Sand  2 sets;10 reps;without UE support   sitting on black pad, No UE needed  PT Education - 09/30/18 1004    Education Details  HEP progression    Person(s) Educated  Patient    Methods  Explanation;Demonstration;Verbal cues;Tactile cues;Handout    Comprehension  Returned demonstration;Verbalized understanding       PT Short Term Goals - 09/25/18 1200      PT SHORT TERM GOAL #1   Title  Pt will be independent in his intial HEP    Time  3    Period  Weeks    Status  On-going      PT SHORT TERM GOAL #2   Title  Pt will be able to perform sit to stand with NO UE support safely.     Time  4    Period  Weeks    Status  New        PT Long Term Goals - 09/23/18 1420      PT LONG TERM GOAL #1   Title  Pt will improve his BERG balance score from 32/56 to >/= 40/56 in order to decrease fall risk and improve functional mobility.    Time  8    Period  Weeks    Status  New     Target Date  12/23/18      PT LONG TERM GOAL #2   Title  Pt will be able to perform 5 time sit to stand without use of UE's in </= 12 seconds.     Time  8    Period  --    Status  New    Target Date  12/16/18            Plan - 09/30/18 0933    Clinical Impression Statement  pt compliant and independent in his HEP. He reports today he does not wear his AFO often because his back keeps him from getting it on. He did not bring with him today. He was able to get out of his chair when sitting a little higher on the black pad, and could perform beginning standing balance exercises with some modifications mainly on single leg stance. Some of the easier ones ( side stepping) was given for HEP progression today.     Rehab Potential  Good    PT Frequency  2x / week    PT Duration  8 weeks    PT Treatment/Interventions  ADLs/Self Care Home Management;Balance training;Therapeutic exercise;Therapeutic activities;Functional mobility training;Stair training;Gait training;Neuromuscular re-education;Patient/family education;Passive range of motion;Manual techniques    PT Next Visit Plan  LE strength, stranding balance activities.     PT Home Exercise Plan  standing heel raises, hip abd, hip fleixon,  [email or web address] Access code    Recommended Other Services  Access code [place] and Agree with Plan of Care  Patient       Patient will benefit from skilled therapeutic intervention in order to improve the following deficits and impairments:  Decreased mobility, Postural dysfunction, Decreased activity tolerance, Decreased range of motion, Decreased strength, Impaired flexibility, Difficulty walking  Visit Diagnosis: Difficulty in walking, not elsewhere classified  Other abnormalities of gait and mobility  Muscle weakness (generalized)     Problem List Patient Active Problem List   Diagnosis Date Noted  . Atrial fibrillation ([place]) 04/15/2018  . Long term (current) use of anticoagulants  04/15/2018  . Colon polyps 04/12/2017  . Nephropathy 11/10/2016  . Mobitz type 1 second degree atrioventricular block 02/07/2016  . Pre-ulcerative calluses 03/16/2015  . Right foot drop 03/16/2015  .  CAD (coronary artery disease), native coronary artery 09/17/2014  . Eustachian tube dysfunction 10/28/2013  . Hearing loss 10/28/2013  . Obesity (BMI 30.0-34.9) 08/30/2012  . Chest wall pain 08/21/2012  . Drug rash 11/21/2011  . Dyslipidemia 07/26/2011  . Type 2 diabetes mellitus ([place]) 06/23/2011  . ACTINIC KERATOSIS 06/11/2008  . ERECTILE DYSFUNCTION 05/21/2008  . CORONARY ARTERY DISEASE, S/P PTCA 02/06/2008  . Essential hypertension 12/13/2006  . HEMORRHOIDS, NOS 12/13/2006  . BPH 12/13/2006  . OSTEOARTHRITIS, LOWER LEG 12/13/2006  . BACK PAIN, LOW 12/13/2006    Kairo Laubacher, PTA 09/30/2018, 10:16 AM  [facility] [address] [place], [place], [address] Phone: [phone]   Fax:  [phone]  Name: Colin Newman MRN: [id number] Date of Birth: 16-Sep-1933  Access Code: [email or web address]  URL: [email or web address]  Date: 09/30/2018  Prepared by: Myrene Galas   Exercises  Seated Long Arc Quad - 10 reps - 1 sets - 5 hold - 2x daily - 7x weekly  Seated Hamstring Stretch - 3 reps - 3 sets - 10 hold - 1x daily - 7x weekly  Side Stepping with Counter Support - 4 reps - 1 sets - 2x daily - 7x weekly  Standing Tandem Balance with Counter Support - 5 reps - 1 sets - 20 hold - 2x daily - 7x weekly

## 2018-10-02 ENCOUNTER — Ambulatory Visit: Payer: Medicare Other | Admitting: Physical Therapy

## 2018-10-02 ENCOUNTER — Encounter: Payer: Self-pay | Admitting: Physical Therapy

## 2018-10-02 DIAGNOSIS — M6281 Muscle weakness (generalized): Secondary | ICD-10-CM | POA: Diagnosis not present

## 2018-10-02 DIAGNOSIS — R262 Difficulty in walking, not elsewhere classified: Secondary | ICD-10-CM

## 2018-10-02 DIAGNOSIS — R2689 Other abnormalities of gait and mobility: Secondary | ICD-10-CM

## 2018-10-02 NOTE — Therapy (Signed)
[facility] [address], [place], [place], [address] Phone: [phone]   Fax:  [phone]  Physical Therapy Treatment  Patient Details  Name: Colin Newman MRN: [id number] Date of Birth: 1933/07/26 Referring Provider (PT): Marvis Repress, FNP   Encounter Date: 10/02/2018  PT End of Session - 10/02/18 1102    Visit Number  4    Number of Visits  16    PT Start Time  1102    PT Stop Time  1142    PT Time Calculation (min)  40 min       Past Medical History:  Diagnosis Date  . Constipation   . Diabetes mellitus type 2, noninsulin dependent ([place])       . Dyslipidemia    Hx with intolerance to statin therapy  . Hypertension   . Obesity   . Single vessel coronary disease    s/p stent LAD 2005    Past Surgical History:  Procedure Laterality Date  . BACK SURGERY     2 previous  . BILATERAL KNEE ARTHROSCOPY    . CARDIOVASCULAR STRESS TEST  07-20-2009   EF 73%  . CARDIOVERSION N/A 07/09/2018   Procedure: CARDIOVERSION;  Surgeon: Sanda Klein, MD;  Location: [place];  Service: Cardiovascular;  Laterality: N/A;  . CORONARY ANGIOPLASTY WITH STENT PLACEMENT  2005  . ESOPHAGOGASTRODUODENOSCOPY N/A 04/09/2018   Procedure: ESOPHAGOGASTRODUODENOSCOPY (EGD);  Surgeon: [name], MD;  Location: [name];  Service: Endoscopy;  Laterality: N/A;  . FINGER SURGERY Right 2018   right 5th finger  . HOT HEMOSTASIS N/A 04/09/2018   Procedure: HOT HEMOSTASIS (ARGON PLASMA COAGULATION/BICAP);  Surgeon: [name], MD;  Location: [name];  Service: Endoscopy;  Laterality: N/A;  . OTHER SURGICAL HISTORY     Stent in the proximal left anterior descending in 2005    There were no vitals filed for this visit.  Subjective Assessment - 10/02/18 1105    Subjective  Pt presents no cane, no AFO. No complaints after last session.     Pertinent History  Back surgeries x 2 > 15 years ago. Pt reporting dropped  foot developed after his second back surgery., DM, A-fib, HTN, bilateral knee replacements (15 years ago)    Currently in Pain?  No/denies                       [facility] Adult PT Treatment/Exercise - 10/02/18 0001      Neuro Re-ed    Neuro Re-ed Details   Standing tandem stance ( small step), tandem walk, side stepping at counter, single leg balance with opposite toe on floor. All 4-6 tripd along the counter.    Tandem stance was 20 sec 4x bil,      Knee/Hip Exercises: Aerobic   Nustep  Level 3 x 10 minutes      Knee/Hip Exercises: Standing   Hip Abduction  Stengthening;Both;2 sets;10 reps;Knee straight   2# added     Knee/Hip Exercises: Seated   Long Arc Quad  AROM;Strengthening;Both;2 sets;10 reps;Weights    Long Arc Quad Weight  2 lbs.    Other Seated Knee/Hip Exercises  red ball shoulder to shoulder for trunk rotation 10x bil    VC for trunk movement   Sit to Sand  2 sets;10 reps;without UE support   sitting on black pad, No UE needed            PT Education - 10/02/18 1136  Education Details  Added single leg stance and tandem walk to HEP    Person(s) Educated  Patient    Methods  Explanation;Demonstration;Verbal cues;Handout    Comprehension  Returned demonstration;Verbalized understanding       PT Short Term Goals - 09/25/18 1200      PT SHORT TERM GOAL #1   Title  Pt will be independent in his intial HEP    Time  3    Period  Weeks    Status  On-going      PT SHORT TERM GOAL #2   Title  Pt will be able to perform sit to stand with NO UE support safely.     Time  4    Period  Weeks    Status  New        PT Long Term Goals - 09/23/18 1420      PT LONG TERM GOAL #1   Title  Pt will improve his BERG balance score from 32/56 to >/= 40/56 in order to decrease fall risk and improve functional mobility.    Time  8    Period  Weeks    Status  New    Target Date  12/23/18      PT LONG TERM GOAL #2   Title  Pt will be able to perform 5  time sit to stand without use of UE's in </= 12 seconds.     Time  8    Period  --    Status  New    Target Date  12/16/18            Plan - 10/02/18 1105    Clinical Impression Statement  pt working on using less UE assistance or no UE during his dynamic standing  balance exercises today. Increased weightsfor LE strength, 2# was appropriate starting place. Pt was able to go sit to stand from regular chair independently today.      Rehab Potential  Good    PT Frequency  2x / week    PT Duration  8 weeks    PT Next Visit Plan  LE strength, stranding balance activities.     PT Home Exercise Plan  standing heel raises, hip abd, hip fleixon,  [email or web address] Access code    Consulted and Agree with Plan of Care  Patient       Patient will benefit from skilled therapeutic intervention in order to improve the following deficits and impairments:  Decreased mobility, Postural dysfunction, Decreased activity tolerance, Decreased range of motion, Decreased strength, Impaired flexibility, Difficulty walking  Visit Diagnosis: Difficulty in walking, not elsewhere classified  Other abnormalities of gait and mobility  Muscle weakness (generalized)     Problem List Patient Active Problem List   Diagnosis Date Noted  . Atrial fibrillation ([place]) 04/15/2018  . Long term (current) use of anticoagulants 04/15/2018  . Colon polyps 04/12/2017  . Nephropathy 11/10/2016  . Mobitz type 1 second degree atrioventricular block 02/07/2016  . Pre-ulcerative calluses 03/16/2015  . Right foot drop 03/16/2015  . CAD (coronary artery disease), native coronary artery 09/17/2014  . Eustachian tube dysfunction 10/28/2013  . Hearing loss 10/28/2013  . Obesity (BMI 30.0-34.9) 08/30/2012  . Chest wall pain 08/21/2012  . Drug rash 11/21/2011  . Dyslipidemia 07/26/2011  . Type 2 diabetes mellitus ([place]) 06/23/2011  . ACTINIC KERATOSIS 06/11/2008  . ERECTILE DYSFUNCTION 05/21/2008  . CORONARY ARTERY DISEASE, S/P  PTCA 02/06/2008  . Essential hypertension 12/13/2006  . HEMORRHOIDS, NOS  12/13/2006  . BPH 12/13/2006  . OSTEOARTHRITIS, LOWER LEG 12/13/2006  . BACK PAIN, LOW 12/13/2006    Alannis Hsia, PTA 10/02/2018, 11:44 AM  [facility] [address] [place], [place], [address] Phone: [phone]   Fax:  [phone]  Name: Colin Newman MRN: [id number] Date of Birth: 10/02/1933

## 2018-10-03 ENCOUNTER — Ambulatory Visit (INDEPENDENT_AMBULATORY_CARE_PROVIDER_SITE_OTHER): Payer: Medicare Other | Admitting: Emergency Medicine

## 2018-10-03 DIAGNOSIS — E538 Deficiency of other specified B group vitamins: Secondary | ICD-10-CM | POA: Diagnosis not present

## 2018-10-03 MED ORDER — CYANOCOBALAMIN 1000 MCG/ML IJ SOLN
1000.0000 ug | Freq: Once | INTRAMUSCULAR | Status: AC
  Administered 2018-10-03: 1000 ug via INTRAMUSCULAR

## 2018-10-03 MED ORDER — METOPROLOL TARTRATE 50 MG PO TABS: 50.0000 mg | ORAL_TABLET | Freq: Two times a day (BID) | ORAL | 3 refills | Status: AC

## 2018-10-07 ENCOUNTER — Encounter: Payer: Self-pay | Admitting: Physical Therapy

## 2018-10-07 ENCOUNTER — Ambulatory Visit: Payer: Medicare Other | Admitting: Physical Therapy

## 2018-10-07 DIAGNOSIS — M6281 Muscle weakness (generalized): Secondary | ICD-10-CM

## 2018-10-07 DIAGNOSIS — R2689 Other abnormalities of gait and mobility: Secondary | ICD-10-CM | POA: Diagnosis not present

## 2018-10-07 DIAGNOSIS — R262 Difficulty in walking, not elsewhere classified: Secondary | ICD-10-CM | POA: Diagnosis not present

## 2018-10-07 NOTE — Therapy (Signed)
[facility] [address] [place], [place], [address] Phone: [phone]   Fax:  [phone]  Physical Therapy Treatment  Patient Details  Name: Colin Newman MRN: [id number] Date of Birth: February 03, 1933 Referring Provider (PT): Marvis Repress, FNP   Encounter Date: 10/07/2018  PT End of Session - 10/07/18 1104    Visit Number  5    Number of Visits  16    PT Start Time  1100    PT Stop Time  1130    PT Time Calculation (min)  30 min    Activity Tolerance  Patient tolerated treatment well    Behavior During Therapy  [facility] for tasks assessed/performed       Past Medical History:  Diagnosis Date  . Constipation   . Diabetes mellitus type 2, noninsulin dependent ([place])       . Dyslipidemia    Hx with intolerance to statin therapy  . Hypertension   . Obesity   . Single vessel coronary disease    s/p stent LAD 2005    Past Surgical History:  Procedure Laterality Date  . BACK SURGERY     2 previous  . BILATERAL KNEE ARTHROSCOPY    . CARDIOVASCULAR STRESS TEST  07-20-2009   EF 73%  . CARDIOVERSION N/A 07/09/2018   Procedure: CARDIOVERSION;  Surgeon: Sanda Klein, MD;  Location: [facility];  Service: Cardiovascular;  Laterality: N/A;  . CORONARY ANGIOPLASTY WITH STENT PLACEMENT  2005  . ESOPHAGOGASTRODUODENOSCOPY N/A 04/09/2018   Procedure: ESOPHAGOGASTRODUODENOSCOPY (EGD);  Surgeon: [name], MD;  Location: [name];  Service: Endoscopy;  Laterality: N/A;  . FINGER SURGERY Right 2018   right 5th finger  . HOT HEMOSTASIS N/A 04/09/2018   Procedure: HOT HEMOSTASIS (ARGON PLASMA COAGULATION/BICAP);  Surgeon: [name], MD;  Location: [name];  Service: Endoscopy;  Laterality: N/A;  . OTHER SURGICAL HISTORY     Stent in the proximal left anterior descending in 2005    There were no vitals filed for this visit.  Subjective Assessment - 10/07/18 1106    Subjective  I tweaked my back this  morning so please be careful. Pt moving gingerly due to his back pain.     Currently in Pain?  Yes    Pain Score  6     Pain Orientation  Right    Pain Descriptors / Indicators  Sharp;Spasm    Aggravating Factors   Bending over this morning    Pain Relieving Factors  ?    Multiple Pain Sites  --                       [facility] Adult PT Treatment/Exercise - 10/07/18 0001      Neuro Re-ed    Neuro Re-ed Details   Standing tandem stance ( small step), tandem walk, side stepping at counter, single leg balance with opposite toe on floor. All 4-6 tripd along the counter.    Tandem stance was 20 sec 4x bil,      Knee/Hip Exercises: Aerobic   Nustep  Level 3 x 10 minutes   PTA present to discuss current status     Knee/Hip Exercises: Standing   Heel Raises  Both;20 reps      Knee/Hip Exercises: Seated   Ball Squeeze  2x10 5 sec hold    Clamshell with TheraBand  --   Red band 2x10  PT Short Term Goals - 10/07/18 1121      PT SHORT TERM GOAL #1   Title  Pt will be independent in his intial HEP    Time  3    Period  Weeks    Status  Achieved      PT SHORT TERM GOAL #2   Title  Pt will be able to perform sit to stand with NO UE support safely.     Time  4    Period  Weeks    Status  Achieved        PT Long Term Goals - 09/23/18 1420      PT LONG TERM GOAL #1   Title  Pt will improve his BERG balance score from 32/56 to >/= 40/56 in order to decrease fall risk and improve functional mobility.    Time  8    Period  Weeks    Status  New    Target Date  12/23/18      PT LONG TERM GOAL #2   Title  Pt will be able to perform 5 time sit to stand without use of UE's in </= 12 seconds.     Time  8    Period  --    Status  New    Target Date  12/16/18            Plan - 10/07/18 1104    Clinical Impression Statement  Pt independent and compliant with HEP, met STG. He can now get out of his chair at home witout use of his arms, meeting  all remaining short term goals. He appeared to have an incontinent episode ( fecal ) at some point during his session ( could have been as early as the Nustep) so pt was ok ending treatment session. The incontinence was not verbally discussed as pt appeared aware of it and was perhaps embarassed.  He was also limited in RT LBP today. he bent over this AM and "tweaked" his back. He has a history of chronic back pain and multiple surgeries. He was quite guarded in his movements today so some exercises were avoided ( LAQ) .    Rehab Potential  Good    PT Frequency  2x / week    PT Duration  8 weeks    PT Treatment/Interventions  ADLs/Self Care Home Management;Balance training;Therapeutic exercise;Therapeutic activities;Functional mobility training;Stair training;Gait training;Neuromuscular re-education;Patient/family education;Passive range of motion;Manual techniques    PT Next Visit Plan  BERG and 5x sit to stand for update. Pt does not wear his AFO.     PT Home Exercise Plan  standing heel raises, hip abd, hip fleixon,  [email or web address] Access code    Consulted and Agree with Plan of Care  Patient       Patient will benefit from skilled therapeutic intervention in order to improve the following deficits and impairments:  Decreased mobility, Postural dysfunction, Decreased activity tolerance, Decreased range of motion, Decreased strength, Impaired flexibility, Difficulty walking  Visit Diagnosis: Difficulty in walking, not elsewhere classified  Other abnormalities of gait and mobility  Muscle weakness (generalized)     Problem List Patient Active Problem List   Diagnosis Date Noted  . Atrial fibrillation ([place]) 04/15/2018  . Long term (current) use of anticoagulants 04/15/2018  . Colon polyps 04/12/2017  . Nephropathy 11/10/2016  . Mobitz type 1 second degree atrioventricular block 02/07/2016  . Pre-ulcerative calluses 03/16/2015  . Right foot drop 03/16/2015  . CAD (coronary artery disease),  native coronary artery 09/17/2014  . Eustachian tube dysfunction 10/28/2013  . Hearing loss 10/28/2013  . Obesity (BMI 30.0-34.9) 08/30/2012  . Chest wall pain 08/21/2012  . Drug rash 11/21/2011  . Dyslipidemia 07/26/2011  . Type 2 diabetes mellitus ([place]) 06/23/2011  . ACTINIC KERATOSIS 06/11/2008  . ERECTILE DYSFUNCTION 05/21/2008  . CORONARY ARTERY DISEASE, S/P PTCA 02/06/2008  . Essential hypertension 12/13/2006  . HEMORRHOIDS, NOS 12/13/2006  . BPH 12/13/2006  . OSTEOARTHRITIS, LOWER LEG 12/13/2006  . BACK PAIN, LOW 12/13/2006    Lavonne Cass, PTA 10/07/2018, 11:36 AM  [facility] [address], [place], [place], [address] Phone: [phone]   Fax:  [phone]  Name: Colin Newman MRN: [id number] Date of Birth: 29-Dec-1932

## 2018-10-10 ENCOUNTER — Ambulatory Visit (INDEPENDENT_AMBULATORY_CARE_PROVIDER_SITE_OTHER): Payer: Medicare Other

## 2018-10-10 ENCOUNTER — Ambulatory Visit: Payer: Medicare Other | Admitting: Physical Therapy

## 2018-10-10 ENCOUNTER — Encounter: Payer: Self-pay | Admitting: Physical Therapy

## 2018-10-10 ENCOUNTER — Telehealth: Payer: Self-pay

## 2018-10-10 ENCOUNTER — Other Ambulatory Visit: Payer: Self-pay | Admitting: Family

## 2018-10-10 DIAGNOSIS — R262 Difficulty in walking, not elsewhere classified: Secondary | ICD-10-CM

## 2018-10-10 DIAGNOSIS — M6281 Muscle weakness (generalized): Secondary | ICD-10-CM | POA: Diagnosis not present

## 2018-10-10 DIAGNOSIS — R2689 Other abnormalities of gait and mobility: Secondary | ICD-10-CM | POA: Diagnosis not present

## 2018-10-10 DIAGNOSIS — E538 Deficiency of other specified B group vitamins: Secondary | ICD-10-CM | POA: Diagnosis not present

## 2018-10-10 MED ORDER — CYANOCOBALAMIN 1000 MCG/ML IJ SOLN
1000.0000 ug | Freq: Once | INTRAMUSCULAR | Status: AC
  Administered 2018-10-10: 1000 ug via INTRAMUSCULAR

## 2018-10-10 NOTE — Therapy (Signed)
[facility] [address] [place], [place], [address] Phone: [phone]   Fax:  [phone]  Physical Therapy Treatment  Patient Details  Name: Colin Newman MRN: [id number] Date of Birth: 05-15-1933 Referring Provider (PT): Marvis Repress, FNP   Encounter Date: 10/10/2018  PT End of Session - 10/10/18 1451    Visit Number  6    Number of Visits  16    Authorization Type  Medicare    PT Start Time  1440    PT Stop Time  1525    PT Time Calculation (min)  45 min    Activity Tolerance  Patient tolerated treatment well       Past Medical History:  Diagnosis Date  . Constipation   . Diabetes mellitus type 2, noninsulin dependent ([place])       . Dyslipidemia    Hx with intolerance to statin therapy  . Hypertension   . Obesity   . Single vessel coronary disease    s/p stent LAD 2005    Past Surgical History:  Procedure Laterality Date  . BACK SURGERY     2 previous  . BILATERAL KNEE ARTHROSCOPY    . CARDIOVASCULAR STRESS TEST  07-20-2009   EF 73%  . CARDIOVERSION N/A 07/09/2018   Procedure: CARDIOVERSION;  Surgeon: Sanda Klein, MD;  Location: [place];  Service: Cardiovascular;  Laterality: N/A;  . CORONARY ANGIOPLASTY WITH STENT PLACEMENT  2005  . ESOPHAGOGASTRODUODENOSCOPY N/A 04/09/2018   Procedure: ESOPHAGOGASTRODUODENOSCOPY (EGD);  Surgeon: [name], MD;  Location: [name];  Service: Endoscopy;  Laterality: N/A;  . FINGER SURGERY Right 2018   right 5th finger  . HOT HEMOSTASIS N/A 04/09/2018   Procedure: HOT HEMOSTASIS (ARGON PLASMA COAGULATION/BICAP);  Surgeon: [name], MD;  Location: [name];  Service: Endoscopy;  Laterality: N/A;  . OTHER SURGICAL HISTORY     Stent in the proximal left anterior descending in 2005    There were no vitals filed for this visit.  Subjective Assessment - 10/10/18 1441    Subjective  It's a pretty good day so far.  I haven't had any  balance in a long time.  No assistive device or AFO.  I just use it sometimes.  I do some of the exercises at home.    Pertinent History  Back surgeries x 2 > 15 years ago. Pt reporting dropped foot developed after his second back surgery., DM, A-fib, HTN, bilateral knee replacements (15 years ago)    Limitations  Walking;House hold activities;Standing    Currently in Pain?  Yes    Pain Score  4    "nothing too bad"   Pain Location  Back                       [facility] Adult PT Treatment/Exercise - 10/10/18 0001      Neuro Re-ed    Neuro Re-ed Details   various foot positions and base of support with head turns, UE reaches, weight shifting 4 ways; standing on black foam with reaching and weight shifting.        Knee/Hip Exercises: Stretches   Active Hamstring Stretch  Right;Left;1 rep      Knee/Hip Exercises: Aerobic   Nustep  Level 3 x 10 minutes   therapist present to discuss  status     Knee/Hip Exercises: Standing   Heel Raises  Both;20 reps    Hip Extension  Stengthening;Right;Left;10 reps  Extension Limitations  red band UE support bil     Forward Step Up  Right;Left;5 reps;Hand Hold: 2    Other Standing Knee Exercises  step tapping with bilateral UE support x 20 reps      Knee/Hip Exercises: Seated   Long Arc Quad  Strengthening;Right;Left;15 reps;Weights    Long Arc Quad Weight  3 lbs.    Clamshell with TheraBand  Green   double and single   Other Seated Knee/Hip Exercises  foam roll push down to engage abdominals 15x    Marching  Strengthening;Right;Left;15 reps;Weights    Marching Weights  3 lbs.    Sit to Sand  10 reps;without UE support   plus pillow            PT Education - 10/10/18 1530    Education Details   Access Code: [email or web address] weight shifting; sit to stand    Person(s) Educated  Patient    Methods  Explanation;Demonstration;Handout    Comprehension  Returned demonstration;Verbalized understanding       PT Short Term Goals -  10/07/18 1121      PT SHORT TERM GOAL #1   Title  Pt will be independent in his intial HEP    Time  3    Period  Weeks    Status  Achieved      PT SHORT TERM GOAL #2   Title  Pt will be able to perform sit to stand with NO UE support safely.     Time  4    Period  Weeks    Status  Achieved        PT Long Term Goals - 09/23/18 1420      PT LONG TERM GOAL #1   Title  Pt will improve his BERG balance score from 32/56 to >/= 40/56 in order to decrease fall risk and improve functional mobility.    Time  8    Period  Weeks    Status  New    Target Date  12/23/18      PT LONG TERM GOAL #2   Title  Pt will be able to perform 5 time sit to stand without use of UE's in </= 12 seconds.     Time  8    Period  --    Status  New    Target Date  12/16/18            Plan - 10/10/18 1524    Clinical Impression Statement  The patient is able to return to seated and standing exercises today without complaint of LBP.  He does tend to fatigue rather quickly with prolonged standing.  He uses a hip and lumbar movement  strategy for balance stabilization rather than ankle strategy and has difficulty weight shifting.  Therapist providing CGA for safety and monitoring for excessive fatigue and pain.  He would benefit from further PT for a progression of dynamic balance ex.  He is resistant to wearing his AFO even though he reports his foot drop was the cause of his bad fall 1 year ago.      Rehab Potential  Good    PT Frequency  2x / week    PT Duration  8 weeks    PT Treatment/Interventions  ADLs/Self Care Home Management;Balance training;Therapeutic exercise;Therapeutic activities;Functional mobility training;Stair training;Gait training;Neuromuscular re-education;Patient/family education;Passive range of motion;Manual techniques    PT Next Visit Plan  BERG and 5x sit to stand recheck;  dynamic standing balance  PT Home Exercise Plan  standing heel raises, hip abd, hip fleixon,  [email or web address]  Access code       Patient will benefit from skilled therapeutic intervention in order to improve the following deficits and impairments:  Decreased mobility, Postural dysfunction, Decreased activity tolerance, Decreased range of motion, Decreased strength, Impaired flexibility, Difficulty walking  Visit Diagnosis: Difficulty in walking, not elsewhere classified  Other abnormalities of gait and mobility  Muscle weakness (generalized)     Problem List Patient Active Problem List   Diagnosis Date Noted  . Atrial fibrillation ([place]) 04/15/2018  . Long term (current) use of anticoagulants 04/15/2018  . Colon polyps 04/12/2017  . Nephropathy 11/10/2016  . Mobitz type 1 second degree atrioventricular block 02/07/2016  . Pre-ulcerative calluses 03/16/2015  . Right foot drop 03/16/2015  . CAD (coronary artery disease), native coronary artery 09/17/2014  . Eustachian tube dysfunction 10/28/2013  . Hearing loss 10/28/2013  . Obesity (BMI 30.0-34.9) 08/30/2012  . Chest wall pain 08/21/2012  . Drug rash 11/21/2011  . Dyslipidemia 07/26/2011  . Type 2 diabetes mellitus ([place]) 06/23/2011  . ACTINIC KERATOSIS 06/11/2008  . ERECTILE DYSFUNCTION 05/21/2008  . CORONARY ARTERY DISEASE, S/P PTCA 02/06/2008  . Essential hypertension 12/13/2006  . HEMORRHOIDS, NOS 12/13/2006  . BPH 12/13/2006  . OSTEOARTHRITIS, LOWER LEG 12/13/2006  . BACK PAIN, LOW 12/13/2006   Ruben Im, PT 10/10/18 5:33 PM Phone: [phone] Fax: [phone]  Alvera Singh 10/10/2018, 5:32 PM  [facility] [address], [place], [place], [address] Phone: [phone]   Fax:  [phone]  Name: Colin Newman MRN: [id number] Date of Birth: October 07, 1933

## 2018-10-10 NOTE — Patient Instructions (Signed)
Access Code: [email or web address]  URL: [email or web address]  Date: 10/10/2018  Prepared by: [place] Arc Quad - 10 reps - 1 sets - 5 hold - 2x daily - 7x weekly  Seated Hamstring Stretch - 3 reps - 3 sets - 10 hold - 1x daily - 7x weekly  Side Stepping with Counter Support - 4 reps - 1 sets - 2x daily - 7x weekly  Standing Tandem Balance with Counter Support - 5 reps - 1 sets - 20 hold - 2x daily - 7x weekly  Tandem Walking with Counter Support - 4 sets - 2x daily - 7x weekly  Standing Single Leg Stance with Unilateral Counter Support - 5 reps - 1 sets - 10 hold - 7x weekly  Standing Weight Shifting Forward and Backward - 10 reps - 1 sets - 1x daily - 7x weekly  Standing Weight Shift Side to Side - 10 reps - 1 sets - 1x daily - 7x weekly  Sit to Stand without Arm Support - 10 reps - 1 sets - 1x daily - 7x weekly

## 2018-10-10 NOTE — Telephone Encounter (Signed)
Order in place; can come get her B12 level in the next week.

## 2018-10-10 NOTE — Telephone Encounter (Signed)
Spoke with patient and info given 

## 2018-10-10 NOTE — Telephone Encounter (Signed)
-----   Message from Juliet Rude, [place] sent at 10/10/2018  9:58 AM EST ----- Morning!  Pt came in today for a nurse visit for a B12 shot. He stated that this was his last injection and wanted to know if he should start taking the B12 prescription that was given to him. Please follow up with patient in regard. Thanks!

## 2018-10-14 ENCOUNTER — Ambulatory Visit: Payer: Medicare Other | Admitting: Physical Therapy

## 2018-10-14 ENCOUNTER — Encounter: Payer: Self-pay | Admitting: Physical Therapy

## 2018-10-14 DIAGNOSIS — R262 Difficulty in walking, not elsewhere classified: Secondary | ICD-10-CM | POA: Diagnosis not present

## 2018-10-14 DIAGNOSIS — R2689 Other abnormalities of gait and mobility: Secondary | ICD-10-CM

## 2018-10-14 DIAGNOSIS — M6281 Muscle weakness (generalized): Secondary | ICD-10-CM

## 2018-10-14 NOTE — Therapy (Signed)
[facility] [address], [place], [place], [address] Phone: [phone]   Fax:  [phone]  Physical Therapy Treatment  Patient Details  Name: Colin Newman MRN: [id number] Date of Birth: [birth year] Referring Provider (PT): Marvis Repress, FNP   Encounter Date: 10/14/2018  PT End of Session - 10/14/18 1140    Visit Number  7    Number of Visits  16    Authorization Type  Medicare    PT Start Time  1100    PT Stop Time  1141    PT Time Calculation (min)  41 min    Activity Tolerance  Patient tolerated treatment well;No increased pain    Behavior During Therapy  WFL for tasks assessed/performed       Past Medical History:  Diagnosis Date  . Constipation   . Diabetes mellitus type 2, noninsulin dependent ([place])       . Dyslipidemia    Hx with intolerance to statin therapy  . Hypertension   . Obesity   . Single vessel coronary disease    s/p stent LAD 2005    Past Surgical History:  Procedure Laterality Date  . BACK SURGERY     2 previous  . BILATERAL KNEE ARTHROSCOPY    . CARDIOVASCULAR STRESS TEST  07-20-2009   EF 73%  . CARDIOVERSION N/A 07/09/2018   Procedure: CARDIOVERSION;  Surgeon: Sanda Klein, MD;  Location: [place];  Service: Cardiovascular;  Laterality: N/A;  . CORONARY ANGIOPLASTY WITH STENT PLACEMENT  2005  . ESOPHAGOGASTRODUODENOSCOPY N/A 04/09/2018   Procedure: ESOPHAGOGASTRODUODENOSCOPY (EGD);  Surgeon: [name], MD;  Location: [name];  Service: Endoscopy;  Laterality: N/A;  . FINGER SURGERY Right 2018   right 5th finger  . HOT HEMOSTASIS N/A 04/09/2018   Procedure: HOT HEMOSTASIS (ARGON PLASMA COAGULATION/BICAP);  Surgeon: [name], MD;  Location: [name];  Service: Endoscopy;  Laterality: N/A;  . OTHER SURGICAL HISTORY     Stent in the proximal left anterior descending in 2005    There were no vitals filed for this visit.  Subjective Assessment -  10/14/18 1101    Subjective  Pt reports that things are going well. He continues to work on his HEP regularly. No complaints at this time and his back is much better.     Pertinent History  Back surgeries x 2 > 15 years ago. Pt reporting dropped foot developed after his second back surgery., DM, A-fib, HTN, bilateral knee replacements (15 years ago)    Limitations  Walking;House hold activities;Standing    Currently in Pain?  No/denies         [facility] PT Assessment - 10/14/18 0001      Transfers   Five time sit to stand comments   14 sec without using UE, unable to reach full extension      Berg Balance Test   Sit to Stand  Able to stand without using hands and stabilize independently    Standing Unsupported  Able to stand safely 2 minutes    Sitting with Back Unsupported but Feet Supported on Floor or Stool  Able to sit safely and securely 2 minutes    Stand to Sit  Sits safely with minimal use of hands    Transfers  Able to transfer safely, minor use of hands    Standing Unsupported with Eyes Closed  Able to stand 10 seconds with supervision   postural swaying noted    Standing Ubsupported with Feet Together  Able to place feet together independently and stand for 1 minute with supervision    From Standing, Reach Forward with Outstretched Arm  Can reach forward >5 cm safely (2")    From Standing Position, Pick up Object from Floor  Unable to pick up shoe, but reaches 2-5 cm (1-2") from shoe and balances independently   back limits this    From Standing Position, Turn to Look Behind Over each Shoulder  Turn sideways only but maintains balance    Turn 360 Degrees  Needs close supervision or verbal cueing    Standing Unsupported, Alternately Place Feet on Step/Stool  Needs assistance to keep from falling or unable to try   pt has to hold onto railing   Standing Unsupported, One Foot in [place] to take small step independently and hold 30 seconds    Standing on One Leg  Tries to lift  leg/unable to hold 3 seconds but remains standing independently    Total Score  36                   [facility] Adult PT Treatment/Exercise - 10/14/18 0001      Knee/Hip Exercises: Stretches   Hip Flexor Stretch  Both;4 reps;10 seconds    Hip Flexor Stretch Limitations  standing forward lunge, heavy cues needed      Knee/Hip Exercises: Aerobic   Nustep  Level 3 x 10 minutes   PT present to discuss progress     Knee/Hip Exercises: Standing   Heel Raises  Both;20 reps    Hip Extension  Stengthening;Right;Left;10 reps    Extension Limitations  yellow TB around ankles, BUE support bil, pt with excess trunk lean during this       Knee/Hip Exercises: Seated   Long Arc Quad  Right;Left;Strengthening;2 sets;15 reps    Long Arc Quad Weight  4 lbs.    Marching  Strengthening;2 sets;10 reps;Both    Marching Weights  4 lbs.             PT Education - 10/14/18 1235    Education Details  importance of using AFO to decrease risk of falling; technique with therex     Person(s) Educated  Patient    Methods  Explanation;Verbal cues    Comprehension  Verbalized understanding;Returned demonstration       PT Short Term Goals - 10/14/18 1145      PT SHORT TERM GOAL #1   Title  Pt will be independent in his intial HEP    Time  3    Period  Weeks    Status  Achieved      PT SHORT TERM GOAL #2   Title  Pt will be able to perform sit to stand with NO UE support safely.     Time  4    Period  Weeks    Status  Achieved        PT Long Term Goals - 10/14/18 1145      PT LONG TERM GOAL #1   Title  Pt will improve his BERG balance score from 32/56 to >/= 40/56 in order to decrease fall risk and improve functional mobility.    Baseline  36/56    Time  8    Period  Weeks    Status  On-going      PT LONG TERM GOAL #2   Title  Pt will be able to perform 5 time sit to stand without use of UE's in </=  12 seconds.     Time  8    Status  New            Plan - 10/14/18  1142    Clinical Impression Statement  Pt is making slow progress towards his balance and mobility goals. His BERG balance score increased by 4 points and he was able to complete 5x sit to stand this session in 14 sec without UE support. Several aspects of the BERG balance were limited either due to lumbar mobility/pain or his Rt foot drop. Therapist discussed the impact his Rt foot drop has on his daily activity and safety, however he is fairly adamant about not wearing an AFO at this time. Ended without increase in back pain. Will continue with current POC moving forward.     Rehab Potential  Good    PT Frequency  2x / week    PT Duration  8 weeks    PT Treatment/Interventions  ADLs/Self Care Home Management;Balance training;Therapeutic exercise;Therapeutic activities;Functional mobility training;Stair training;Gait training;Neuromuscular re-education;Patient/family education;Passive range of motion;Manual techniques    PT Next Visit Plan  dynamic standing balance; hip flexor flexibility, and glute strength progression    PT Home Exercise Plan  standing heel raises, hip abd, hip fleixon,  [email or web address] Access code       Patient will benefit from skilled therapeutic intervention in order to improve the following deficits and impairments:  Decreased mobility, Postural dysfunction, Decreased activity tolerance, Decreased range of motion, Decreased strength, Impaired flexibility, Difficulty walking  Visit Diagnosis: Difficulty in walking, not elsewhere classified  Other abnormalities of gait and mobility  Muscle weakness (generalized)     Problem List Patient Active Problem List   Diagnosis Date Noted  . Atrial fibrillation ([place]) 04/15/2018  . Long term (current) use of anticoagulants 04/15/2018  . Colon polyps 04/12/2017  . Nephropathy 11/10/2016  . Mobitz type 1 second degree atrioventricular block 02/07/2016  . Pre-ulcerative calluses 03/16/2015  . Right foot drop 03/16/2015  . CAD  (coronary artery disease), native coronary artery 09/17/2014  . Eustachian tube dysfunction 10/28/2013  . Hearing loss 10/28/2013  . Obesity (BMI 30.0-34.9) 08/30/2012  . Chest wall pain 08/21/2012  . Drug rash 11/21/2011  . Dyslipidemia 07/26/2011  . Type 2 diabetes mellitus ([place]) 06/23/2011  . ACTINIC KERATOSIS 06/11/2008  . ERECTILE DYSFUNCTION 05/21/2008  . CORONARY ARTERY DISEASE, S/P PTCA 02/06/2008  . Essential hypertension 12/13/2006  . HEMORRHOIDS, NOS 12/13/2006  . BPH 12/13/2006  . OSTEOARTHRITIS, LOWER LEG 12/13/2006  . BACK PAIN, LOW 12/13/2006    12:37 PM,10/14/18 Sherol Dade PT, DPT [facility] [address] [place], [place], [address] Phone: [phone]   Fax:  [phone]  Name: Colin Newman MRN: [id number] Date of Birth: 06-Sep-1933

## 2018-10-15 ENCOUNTER — Other Ambulatory Visit (INDEPENDENT_AMBULATORY_CARE_PROVIDER_SITE_OTHER): Payer: Medicare Other

## 2018-10-15 DIAGNOSIS — E538 Deficiency of other specified B group vitamins: Secondary | ICD-10-CM | POA: Diagnosis not present

## 2018-10-15 LAB — VITAMIN B12: Vitamin B-12: 792 pg/mL (ref 211–911)

## 2018-10-17 ENCOUNTER — Ambulatory Visit: Payer: Medicare Other | Attending: Family | Admitting: Physical Therapy

## 2018-10-17 ENCOUNTER — Encounter: Payer: Self-pay | Admitting: Physical Therapy

## 2018-10-17 DIAGNOSIS — R262 Difficulty in walking, not elsewhere classified: Secondary | ICD-10-CM | POA: Insufficient documentation

## 2018-10-17 DIAGNOSIS — R2689 Other abnormalities of gait and mobility: Secondary | ICD-10-CM | POA: Insufficient documentation

## 2018-10-17 NOTE — Therapy (Addendum)
[facility] [address], [place], [place], [address] Phone: [phone]   Fax:  [phone]  Physical Therapy Treatment/Discharge  Patient Details  Name: Colin Newman MRN: [id number] Date of Birth: January 17, 1933 Referring Provider (PT): Marvis Repress, FNP   Encounter Date: 10/17/2018  PT End of Session - 10/17/18 1134    Visit Number  8    Number of Visits  16    Authorization Type  Medicare    PT Start Time  7893    PT Stop Time  1129    PT Time Calculation (min)  44 min    Activity Tolerance  Patient tolerated treatment well;No increased pain    Behavior During Therapy  WFL for tasks assessed/performed       Past Medical History:  Diagnosis Date  . Constipation   . Diabetes mellitus type 2, noninsulin dependent ([place])       . Dyslipidemia    Hx with intolerance to statin therapy  . Hypertension   . Obesity   . Single vessel coronary disease    s/p stent LAD 2005    Past Surgical History:  Procedure Laterality Date  . BACK SURGERY     2 previous  . BILATERAL KNEE ARTHROSCOPY    . CARDIOVASCULAR STRESS TEST  07-20-2009   EF 73%  . CARDIOVERSION N/A 07/09/2018   Procedure: CARDIOVERSION;  Surgeon: Sanda Klein, MD;  Location: [facility];  Service: Cardiovascular;  Laterality: N/A;  . CORONARY ANGIOPLASTY WITH STENT PLACEMENT  2005  . ESOPHAGOGASTRODUODENOSCOPY N/A 04/09/2018   Procedure: ESOPHAGOGASTRODUODENOSCOPY (EGD);  Surgeon: [name], MD;  Location: [name];  Service: Endoscopy;  Laterality: N/A;  . FINGER SURGERY Right 2018   right 5th finger  . HOT HEMOSTASIS N/A 04/09/2018   Procedure: HOT HEMOSTASIS (ARGON PLASMA COAGULATION/BICAP);  Surgeon: [name], MD;  Location: [name];  Service: Endoscopy;  Laterality: N/A;  . OTHER SURGICAL HISTORY     Stent in the proximal left anterior descending in 2005    There were no vitals filed for this visit.  Subjective Assessment  - 10/17/18 1048    Subjective  Pt states that he is going back to his PCP to see if she thinks this therapy is helping him. He still thinks his balance is bad.     Pertinent History  Back surgeries x 2 > 15 years ago. Pt reporting dropped foot developed after his second back surgery., DM, A-fib, HTN, bilateral knee replacements (15 years ago)    Limitations  Walking;House hold activities;Standing    Currently in Pain?  No/denies                      Nustep L3 x5 min, PT present to discuss progress and POC      Balance Exercises - 10/17/18 1117      Balance Exercises: Standing   Standing Eyes Opened  Narrow base of support (BOS)   trunk rotation x10 reps holding yellow weighted ball    Standing Eyes Closed  Narrow base of support (BOS);Foam/compliant surface;2 reps;30 secs    Tandem Stance  Eyes open;2 reps;30 secs   partial tandem    Standing, One Foot on a Step  Eyes open;Foam/compliant surface;2 reps;20 secs   foot resting on foam roll   Other Standing Exercises  forward step over pool noodle x10 reps each, side step over pool noodle x10 reps each LE  PT Education - 10/17/18 1133    Education Details  reviewed some AFO options and encouraged pt to discuss possible orhotics referral from MD; impact foot drop has on balance and safety with functional tasks     Person(s) Educated  Patient    Methods  Explanation;Demonstration    Comprehension  Verbalized understanding       PT Short Term Goals - 10/14/18 1145      PT SHORT TERM GOAL #1   Title  Pt will be independent in his intial HEP    Time  3    Period  Weeks    Status  Achieved      PT SHORT TERM GOAL #2   Title  Pt will be able to perform sit to stand with NO UE support safely.     Time  4    Period  Weeks    Status  Achieved        PT Long Term Goals - 10/14/18 1145      PT LONG TERM GOAL #1   Title  Pt will improve his BERG balance score from 32/56 to >/= 40/56 in order to decrease  fall risk and improve functional mobility.    Baseline  36/56    Time  8    Period  Weeks    Status  On-going      PT LONG TERM GOAL #2   Title  Pt will be able to perform 5 time sit to stand without use of UE's in </= 12 seconds.     Time  8    Status  New            Plan - 10/17/18 1140    Clinical Impression Statement  Pt was placed on hold today and plans to go back to his referring physician to determine if this is helping or not. Therapist reminded pt of the improvements noted on his BERG balance score at his last visit, increasing by atleast 4 points in just a couple of weeks. Therapist also reminded pt that he is making progress as expected half way through his POC. Aside from his limitations in strength and proprioception, pt does have chronic Rt foot drop. This is his largest limiting factor with his balance and places him at increased risk of falling and injuring himself. Pt has been encouraged to wear his AFO at multiple precious visits, but he is resistant due to poor fit and hassle of his current device. He would greatly benefit from wearing an AFO and could also benefit from a referral to the PCPs recommended orthotist to ensure that there is proper fit and style to increase adherence/wear. Pt would also continue to benefit from PT if he chooses, and is welcome back to this clinic if his PCP feels this is necessary.     Rehab Potential  Good    PT Frequency  2x / week    PT Duration  8 weeks    PT Treatment/Interventions  ADLs/Self Care Home Management;Balance training;Therapeutic exercise;Therapeutic activities;Functional mobility training;Stair training;Gait training;Neuromuscular re-education;Patient/family education;Passive range of motion;Manual techniques    PT Next Visit Plan  f/u on MD appointment and possible orthotics referral for AFO; dynamic standing balance; hip flexor flexibility, and glute strength progression    PT Home Exercise Plan  standing heel raises, hip  abd, hip fleixon,  [email or web address] Access code       Patient will benefit from skilled therapeutic intervention in order to improve the following deficits  and impairments:  Decreased mobility, Postural dysfunction, Decreased activity tolerance, Decreased range of motion, Decreased strength, Impaired flexibility, Difficulty walking  Visit Diagnosis: Difficulty in walking, not elsewhere classified  Other abnormalities of gait and mobility     Problem List Patient Active Problem List   Diagnosis Date Noted  . Atrial fibrillation ([place]) 04/15/2018  . Long term (current) use of anticoagulants 04/15/2018  . Colon polyps 04/12/2017  . Nephropathy 11/10/2016  . Mobitz type 1 second degree atrioventricular block 02/07/2016  . Pre-ulcerative calluses 03/16/2015  . Right foot drop 03/16/2015  . CAD (coronary artery disease), native coronary artery 09/17/2014  . Eustachian tube dysfunction 10/28/2013  . Hearing loss 10/28/2013  . Obesity (BMI 30.0-34.9) 08/30/2012  . Chest wall pain 08/21/2012  . Drug rash 11/21/2011  . Dyslipidemia 07/26/2011  . Type 2 diabetes mellitus ([place]) 06/23/2011  . ACTINIC KERATOSIS 06/11/2008  . ERECTILE DYSFUNCTION 05/21/2008  . CORONARY ARTERY DISEASE, S/P PTCA 02/06/2008  . Essential hypertension 12/13/2006  . HEMORRHOIDS, NOS 12/13/2006  . BPH 12/13/2006  . OSTEOARTHRITIS, LOWER LEG 12/13/2006  . BACK PAIN, LOW 12/13/2006   11:42 AM,10/17/18 Sherol Dade PT, DPT [place] [facility] [address], [place], [place], [address] Phone: [phone]   Fax:  [phone]  Name: Colin Newman MRN: [id number] Date of Birth: 1933/03/17  *addendum to resolve episode of care and d/c pt from [place]  Visits from Start of Care: 8 (pt only completed 4 weeks of PT)  Current functional level related to goals /  functional outcomes: See above for more details    Remaining deficits: See above for more details    Education / Equipment: See above for more details   Plan: Patient agrees to discharge.  Patient goals were partially met. Patient is being discharged due to the patient's request.  ?????            Pt made some progress towards his long term balance goals, which is expected over the course of a 4 week period. Pt's primary limitation is his chronic foot drop. Pt was educated on bracing techniques and recommendations for orthotics and further evaluation which would prevent his toe dragging during ambulation. Pt has no interest in pursuing this, and will likely remain unsteady with his gait as a result.   8:23 AM,12/05/18 Sherol Dade PT, [place] [facility] [place]

## 2018-10-18 ENCOUNTER — Encounter: Payer: Self-pay | Admitting: Family

## 2018-10-28 ENCOUNTER — Ambulatory Visit (INDEPENDENT_AMBULATORY_CARE_PROVIDER_SITE_OTHER): Payer: Medicare Other | Admitting: Pharmacist

## 2018-10-28 DIAGNOSIS — I4891 Unspecified atrial fibrillation: Secondary | ICD-10-CM | POA: Diagnosis not present

## 2018-10-28 DIAGNOSIS — Z7901 Long term (current) use of anticoagulants: Secondary | ICD-10-CM

## 2018-10-28 LAB — POCT INR: INR: 2.3 (ref 2.0–3.0)

## 2018-11-12 ENCOUNTER — Ambulatory Visit (INDEPENDENT_AMBULATORY_CARE_PROVIDER_SITE_OTHER): Payer: Medicare Other

## 2018-11-12 DIAGNOSIS — E538 Deficiency of other specified B group vitamins: Secondary | ICD-10-CM | POA: Diagnosis not present

## 2018-11-12 MED ORDER — CYANOCOBALAMIN 1000 MCG/ML IJ SOLN
1000.0000 ug | Freq: Once | INTRAMUSCULAR | Status: AC
  Administered 2018-11-12: 1000 ug via INTRAMUSCULAR

## 2018-11-15 NOTE — Progress Notes (Signed)
Agree with plan to give B12;

## 2018-11-17 NOTE — Progress Notes (Signed)
Cardiology Office Note   Date:  11/17/2018   ID:  Colin Newman, DOB [birth year], MRN [id number]  PCP:  [name], FNP  Cardiologist: Dr. [name]  No chief complaint on file.    History of Present Illness: Colin Newman is a 85 y.o. male who presents for follow up  of coronary artery disease and Afib,  history of a stent to the proximal LAD in 2005, Afib, marked first-degree AV block, hypertension, hyperlipidemia, and diabetes.  The patient is intolerant to multiple antihypertensives, to include (copy for accuracy):  Losartan - rash/angioedemia Lisinopril - itching HCTZ - itching Chlorthalidone - cost-prohibitive Hydralazine - caused him to be swimmy headed  When last seen in the office on 03/14/2018 the patient complained of worsening dyspnea on exertion, having to stop and rest frequently.  The patient had gained approximately 8 to 10 pounds, and had not been adhering to diabetic low-sodium diet. On that office visit the patient was found to have new onset atrial fibrillation for EKG, and it was suspected that the atrial fibrillation was contributing to his overall symptoms and fluid retention.  The patient was placed on Eliquis 5 mg twice daily for prevention of CVA with a CHADS VASC Score of 4.  An echocardiogram was ordered along with a TSH, BMET, hemoglobin A1c and CBC.     He was noted to have anemia with a hemoglobin of 10.8 and hematocrit of 32.5.  Hemoccult was positive. He was referred to GI and underwent EGD by Dr. [name] on 04/09/18. This showed Grade LA esophagitis and multiple angioectasias in the stomach, duodenum, and jejunum that were cauterized. Anticoagulation later resumed with Coumadin. INRs were therapeutic and he underwent successful DCCV on 07/09/18. When seen in October he was back in atrial flutter with a controlled rate so was managed with rate control and anticoagulation   On follow up today he feels well. He denies any chest pain, dyspnea, palpitations or  dizziness. No increase in edema. No bleeding on coumadin. He has chronically poor balance. Did 10 weeks of PT without much improvement.    Past Medical History:  Diagnosis Date  . Constipation   . Diabetes mellitus type 2, noninsulin dependent ([place])       . Dyslipidemia    Hx with intolerance to statin therapy  . Hypertension   . Obesity   . Single vessel coronary disease    s/p stent LAD 2005    Past Surgical History:  Procedure Laterality Date  . BACK SURGERY     2 previous  . BILATERAL KNEE ARTHROSCOPY    . CARDIOVASCULAR STRESS TEST  07-20-2009   EF 73%  . CARDIOVERSION N/A 07/09/2018   Procedure: CARDIOVERSION;  Surgeon: Sanda Klein, MD;  Location: [facility];  Service: Cardiovascular;  Laterality: N/A;  . CORONARY ANGIOPLASTY WITH STENT PLACEMENT  2005  . ESOPHAGOGASTRODUODENOSCOPY N/A 04/09/2018   Procedure: ESOPHAGOGASTRODUODENOSCOPY (EGD);  Surgeon: [name], MD;  Location: [name];  Service: Endoscopy;  Laterality: N/A;  . FINGER SURGERY Right 2018   right 5th finger  . HOT HEMOSTASIS N/A 04/09/2018   Procedure: HOT HEMOSTASIS (ARGON PLASMA COAGULATION/BICAP);  Surgeon: [name], MD;  Location: [name];  Service: Endoscopy;  Laterality: N/A;  . OTHER SURGICAL HISTORY     Stent in the proximal left anterior descending in 2005     Current Outpatient Medications  Medication Sig Dispense Refill  . amLODipine (NORVASC) 5 MG tablet Take 1 tablet (5 mg total) by mouth daily.  90 tablet 3  . atorvastatin (LIPITOR) 40 MG tablet Take 1 tablet (40 mg total) by mouth daily. 90 tablet 3  . furosemide (LASIX) 20 MG tablet Take 1 tablet (20 mg total) by mouth daily. 90 tablet 3  . gabapentin (NEURONTIN) 100 MG capsule Take 2 capsules (200 mg total) by mouth at bedtime. 180 capsule 0  . metFORMIN (GLUCOPHAGE) 1000 MG tablet Take 1 tablet (1,000 mg total) by mouth 2 (two) times daily. 180 tablet 3  . metoprolol tartrate (LOPRESSOR) 50 MG tablet Take 1 tablet  (50 mg total) by mouth 2 (two) times daily. 180 tablet 3  . Multiple Vitamin (MULTI-VITAMIN DAILY PO) Take 1 tablet by mouth daily.     . nitroGLYCERIN (NITROSTAT) 0.4 MG SL tablet DISSOLVE ONE TABLET UNDER THE TONGUE EVERY 5 MINUTES AS NEEDED FOR CHEST PAIN.  DO NOT EXCEED A TOTAL OF 3 DOSES IN 15 MINUTES (Patient taking differently: Place 0.4 mg under the tongue every 5 (five) minutes as needed for chest pain. ) 10 tablet 0  . Omega-3 Fatty Acids (FISH OIL) 1200 MG CAPS Take 1,200 mg by mouth daily.     Colin Newman warfarin (COUMADIN) 5 MG tablet TAKE 1 TABLET BY MOUTH ONCE DAILY OR AS DIRECTED BY COUMADIN CLINIC 90 tablet 1   No current facility-administered medications for this visit.     Allergies:   Lisinopril; Losartan; and Hctz [hydrochlorothiazide]    Social History:  The patient  reports that he has quit smoking. His smokeless tobacco use includes chew. He reports current alcohol use. He reports that he does not use drugs.   Family History:  The patient's family history includes Cancer in his father; Pulmonary embolism in his mother.    ROS: All other systems are reviewed and negative. Unless otherwise mentioned in H&P    PHYSICAL EXAM: VS:  There were no vitals taken for this visit. , BMI There is no height or weight on file to calculate BMI. GENERAL:  Well appearing WM in NAD HEENT:  PERRL, EOMI, sclera are clear. Oropharynx is clear. NECK:  No jugular venous distention, carotid upstroke brisk and symmetric, no bruits, no thyromegaly or adenopathy LUNGS:  Clear to auscultation bilaterally CHEST:  Unremarkable HEART:  IRRR,  PMI not displaced or sustained,S1 and S2 within normal limits, no S3, no S4: no clicks, no rubs, no murmurs ABD:  Soft, nontender. BS +, no masses or bruits. No hepatomegaly, no splenomegaly EXT:  2 + pulses throughout, Tr edema, no cyanosis no clubbing. varicosities SKIN:  Warm and dry.  No rashes NEURO:  Alert and oriented x 3. Cranial nerves II through XII  intact. PSYCH:  Cognitively intact   Recent Labs: 03/14/2018: TSH 3.790 09/09/2018: ALT 12; BUN 19; Creatinine, Ser 1.11; Hemoglobin 10.7; Platelets 216.0; Potassium 4.5; Sodium 139  03/14/18 A1c 7.4%.    Lipid Panel    Component Value Date/Time   CHOL 166 09/09/2018 1052   TRIG 120.0 09/09/2018 1052   HDL 33.50 (L) 09/09/2018 1052   CHOLHDL 5 09/09/2018 1052   VLDL 24.0 09/09/2018 1052   LDLCALC 108 (H) 09/09/2018 1052   LDLDIRECT 66 11/04/2013 1032      Wt Readings from Last 3 Encounters:  09/09/18 223 lb (101.2 kg)  08/01/18 227 lb (103 kg)  07/04/18 229 lb 12.8 oz (104.2 kg)    Ecg today shows Atrial flutter with rate 67. Nonspecific ST-T changes. I have personally reviewed and interpreted this study.   Other studies Reviewed: NM  Stress Test 07/20/2012  Echo 03/15/2018 :  Normal LV systolic function with an EF of 60% to 65%, no evidence of thrombus, aortic valve noncoronary cusp mobility was mildly restricted, the left atrium was only mildly dilated, the right atrium was only mildly dilated, and he was found to have moderately increased PA pressure of 51 mmHg.  ASSESSMENT AND PLAN:  1.  Atrial fib/Flutter:  S/p successful DCCV on 07/09/18. Early recurrence of Atrial flutter. Rate controlled on metoprolol and he is anticoagulated on coumadin.  CHADS Vasc Score of 4.   2.  Anemia with heme positive stool:  Due to esophagitis and multiple upper tract angioectasias. S/p cautery in June 2019. Need to watch INR closely to avoid bleeding.   3.  Hypertension: Fair control. Multiple medication intolerances.   4. CAD s/p remote stent of LAD in 2005. No ASA since on coumadin and is at higher bleeding risk.

## 2018-11-19 ENCOUNTER — Ambulatory Visit (INDEPENDENT_AMBULATORY_CARE_PROVIDER_SITE_OTHER): Payer: Medicare Other | Admitting: Cardiology

## 2018-11-19 ENCOUNTER — Ambulatory Visit (INDEPENDENT_AMBULATORY_CARE_PROVIDER_SITE_OTHER): Payer: Medicare Other | Admitting: Pharmacist

## 2018-11-19 ENCOUNTER — Encounter: Payer: Self-pay | Admitting: Cardiology

## 2018-11-19 VITALS — BP 150/60 | HR 65 | Ht 71.0 in | Wt 214.6 lb

## 2018-11-19 DIAGNOSIS — I10 Essential (primary) hypertension: Secondary | ICD-10-CM

## 2018-11-19 DIAGNOSIS — I4819 Other persistent atrial fibrillation: Secondary | ICD-10-CM

## 2018-11-19 DIAGNOSIS — I4891 Unspecified atrial fibrillation: Secondary | ICD-10-CM

## 2018-11-19 DIAGNOSIS — Z7901 Long term (current) use of anticoagulants: Secondary | ICD-10-CM | POA: Diagnosis not present

## 2018-11-19 DIAGNOSIS — I2510 Atherosclerotic heart disease of native coronary artery without angina pectoris: Secondary | ICD-10-CM

## 2018-11-19 LAB — POCT INR: INR: 2.1 (ref 2.0–3.0)

## 2018-12-13 ENCOUNTER — Ambulatory Visit (INDEPENDENT_AMBULATORY_CARE_PROVIDER_SITE_OTHER): Payer: Medicare Other

## 2018-12-13 DIAGNOSIS — E538 Deficiency of other specified B group vitamins: Secondary | ICD-10-CM | POA: Diagnosis not present

## 2018-12-13 MED ORDER — CYANOCOBALAMIN 1000 MCG/ML IJ SOLN
1000.0000 ug | Freq: Once | INTRAMUSCULAR | Status: AC
  Administered 2018-12-13: 1000 ug via INTRAMUSCULAR

## 2018-12-18 NOTE — Progress Notes (Signed)
Agree with treatment 

## 2019-01-10 ENCOUNTER — Ambulatory Visit: Payer: Medicare Other

## 2019-01-31 ENCOUNTER — Telehealth: Payer: Self-pay

## 2019-01-31 NOTE — Telephone Encounter (Signed)

## 2019-02-03 ENCOUNTER — Other Ambulatory Visit: Payer: Self-pay

## 2019-02-03 ENCOUNTER — Ambulatory Visit (INDEPENDENT_AMBULATORY_CARE_PROVIDER_SITE_OTHER): Payer: Medicare Other | Admitting: *Deleted

## 2019-02-03 DIAGNOSIS — I4891 Unspecified atrial fibrillation: Secondary | ICD-10-CM | POA: Diagnosis not present

## 2019-02-03 DIAGNOSIS — Z7901 Long term (current) use of anticoagulants: Secondary | ICD-10-CM

## 2019-02-03 LAB — POCT INR: INR: 2.5 (ref 2.0–3.0)

## 2019-02-15 ENCOUNTER — Encounter (HOSPITAL_COMMUNITY): Payer: Self-pay | Admitting: Emergency Medicine

## 2019-02-15 ENCOUNTER — Inpatient Hospital Stay (HOSPITAL_COMMUNITY)
Admission: EM | Admit: 2019-02-15 | Discharge: 2019-02-18 | DRG: 683 | Disposition: A | Discharge status: Home Health | Payer: Medicare Other | Attending: Internal Medicine | Admitting: Internal Medicine

## 2019-02-15 ENCOUNTER — Observation Stay (HOSPITAL_COMMUNITY): Payer: Medicare Other

## 2019-02-15 ENCOUNTER — Emergency Department (HOSPITAL_COMMUNITY): Payer: Medicare Other

## 2019-02-15 ENCOUNTER — Other Ambulatory Visit: Payer: Self-pay

## 2019-02-15 DIAGNOSIS — I10 Essential (primary) hypertension: Secondary | ICD-10-CM | POA: Diagnosis present

## 2019-02-15 DIAGNOSIS — I480 Paroxysmal atrial fibrillation: Secondary | ICD-10-CM | POA: Diagnosis present

## 2019-02-15 DIAGNOSIS — Z1159 Encounter for screening for other viral diseases: Secondary | ICD-10-CM | POA: Diagnosis not present

## 2019-02-15 DIAGNOSIS — E785 Hyperlipidemia, unspecified: Secondary | ICD-10-CM | POA: Diagnosis present

## 2019-02-15 DIAGNOSIS — I482 Chronic atrial fibrillation, unspecified: Secondary | ICD-10-CM | POA: Diagnosis not present

## 2019-02-15 DIAGNOSIS — R0902 Hypoxemia: Secondary | ICD-10-CM | POA: Diagnosis not present

## 2019-02-15 DIAGNOSIS — Z7984 Long term (current) use of oral hypoglycemic drugs: Secondary | ICD-10-CM

## 2019-02-15 DIAGNOSIS — D72829 Elevated white blood cell count, unspecified: Secondary | ICD-10-CM | POA: Diagnosis present

## 2019-02-15 DIAGNOSIS — R000 Tachycardia, unspecified: Secondary | ICD-10-CM | POA: Diagnosis not present

## 2019-02-15 DIAGNOSIS — Z955 Presence of coronary angioplasty implant and graft: Secondary | ICD-10-CM

## 2019-02-15 DIAGNOSIS — R531 Weakness: Secondary | ICD-10-CM | POA: Diagnosis not present

## 2019-02-15 DIAGNOSIS — I2510 Atherosclerotic heart disease of native coronary artery without angina pectoris: Secondary | ICD-10-CM | POA: Diagnosis present

## 2019-02-15 DIAGNOSIS — Z66 Do not resuscitate: Secondary | ICD-10-CM | POA: Diagnosis present

## 2019-02-15 DIAGNOSIS — E875 Hyperkalemia: Secondary | ICD-10-CM | POA: Diagnosis present

## 2019-02-15 DIAGNOSIS — S0990XA Unspecified injury of head, initial encounter: Secondary | ICD-10-CM | POA: Diagnosis not present

## 2019-02-15 DIAGNOSIS — I4891 Unspecified atrial fibrillation: Secondary | ICD-10-CM | POA: Diagnosis not present

## 2019-02-15 DIAGNOSIS — M6282 Rhabdomyolysis: Secondary | ICD-10-CM | POA: Diagnosis not present

## 2019-02-15 DIAGNOSIS — E872 Acidosis: Secondary | ICD-10-CM | POA: Diagnosis present

## 2019-02-15 DIAGNOSIS — Z7901 Long term (current) use of anticoagulants: Secondary | ICD-10-CM

## 2019-02-15 DIAGNOSIS — T796XXA Traumatic ischemia of muscle, initial encounter: Secondary | ICD-10-CM | POA: Diagnosis not present

## 2019-02-15 DIAGNOSIS — N179 Acute kidney failure, unspecified: Secondary | ICD-10-CM | POA: Diagnosis not present

## 2019-02-15 DIAGNOSIS — E860 Dehydration: Secondary | ICD-10-CM | POA: Diagnosis present

## 2019-02-15 DIAGNOSIS — I4892 Unspecified atrial flutter: Secondary | ICD-10-CM | POA: Diagnosis not present

## 2019-02-15 DIAGNOSIS — R42 Dizziness and giddiness: Secondary | ICD-10-CM | POA: Diagnosis not present

## 2019-02-15 DIAGNOSIS — M545 Low back pain, unspecified: Secondary | ICD-10-CM | POA: Diagnosis present

## 2019-02-15 DIAGNOSIS — S199XXA Unspecified injury of neck, initial encounter: Secondary | ICD-10-CM | POA: Diagnosis not present

## 2019-02-15 DIAGNOSIS — M25559 Pain in unspecified hip: Secondary | ICD-10-CM | POA: Diagnosis not present

## 2019-02-15 DIAGNOSIS — M160 Bilateral primary osteoarthritis of hip: Secondary | ICD-10-CM | POA: Diagnosis not present

## 2019-02-15 DIAGNOSIS — Z87891 Personal history of nicotine dependence: Secondary | ICD-10-CM

## 2019-02-15 DIAGNOSIS — Z79899 Other long term (current) drug therapy: Secondary | ICD-10-CM

## 2019-02-15 DIAGNOSIS — R740 Nonspecific elevation of levels of transaminase and lactic acid dehydrogenase [LDH]: Secondary | ICD-10-CM | POA: Diagnosis present

## 2019-02-15 DIAGNOSIS — M549 Dorsalgia, unspecified: Secondary | ICD-10-CM | POA: Diagnosis present

## 2019-02-15 DIAGNOSIS — E119 Type 2 diabetes mellitus without complications: Secondary | ICD-10-CM | POA: Diagnosis present

## 2019-02-15 LAB — CBC WITH DIFFERENTIAL/PLATELET
Abs Immature Granulocytes: 0.25 10*3/uL — ABNORMAL HIGH (ref 0.00–0.07)
Basophils Absolute: 0 10*3/uL (ref 0.0–0.1)
Basophils Relative: 0 %
Eosinophils Absolute: 0.2 10*3/uL (ref 0.0–0.5)
Eosinophils Relative: 1 %
HCT: 35.4 % — ABNORMAL LOW (ref 39.0–52.0)
Hemoglobin: 11.3 g/dL — ABNORMAL LOW (ref 13.0–17.0)
Immature Granulocytes: 1 %
Lymphocytes Relative: 4 %
Lymphs Abs: 0.9 10*3/uL (ref 0.7–4.0)
MCH: 32.6 pg (ref 26.0–34.0)
MCHC: 31.9 g/dL (ref 30.0–36.0)
MCV: 102 fL — ABNORMAL HIGH (ref 80.0–100.0)
Monocytes Absolute: 2.6 10*3/uL — ABNORMAL HIGH (ref 0.1–1.0)
Monocytes Relative: 12 %
Neutro Abs: 16.8 10*3/uL — ABNORMAL HIGH (ref 1.7–7.7)
Neutrophils Relative %: 82 %
Platelets: 240 10*3/uL (ref 150–400)
RBC: 3.47 MIL/uL — ABNORMAL LOW (ref 4.22–5.81)
RDW: 15.7 % — ABNORMAL HIGH (ref 11.5–15.5)
WBC: 20.7 10*3/uL — ABNORMAL HIGH (ref 4.0–10.5)
nRBC: 0.2 % (ref 0.0–0.2)

## 2019-02-15 LAB — COMPREHENSIVE METABOLIC PANEL
ALT: 54 U/L — ABNORMAL HIGH (ref 0–44)
AST: 161 U/L — ABNORMAL HIGH (ref 15–41)
Albumin: 3.4 g/dL — ABNORMAL LOW (ref 3.5–5.0)
Alkaline Phosphatase: 105 U/L (ref 38–126)
Anion gap: 19 — ABNORMAL HIGH (ref 5–15)
BUN: 44 mg/dL — ABNORMAL HIGH (ref 8–23)
CO2: 16 mmol/L — ABNORMAL LOW (ref 22–32)
Calcium: 8.5 mg/dL — ABNORMAL LOW (ref 8.9–10.3)
Chloride: 104 mmol/L (ref 98–111)
Creatinine, Ser: 1.91 mg/dL — ABNORMAL HIGH (ref 0.61–1.24)
GFR calc Af Amer: 37 mL/min — ABNORMAL LOW (ref >60)
GFR calc non Af Amer: 32 mL/min — ABNORMAL LOW (ref >60)
Glucose, Bld: 171 mg/dL — ABNORMAL HIGH (ref 70–99)
Potassium: 5.3 mmol/L — ABNORMAL HIGH (ref 3.5–5.1)
Sodium: 139 mmol/L (ref 135–145)
Total Bilirubin: 1.8 mg/dL — ABNORMAL HIGH (ref 0.3–1.2)
Total Protein: 6.5 g/dL (ref 6.5–8.1)

## 2019-02-15 LAB — CBG MONITORING, ED: Glucose-Capillary: 165 mg/dL — ABNORMAL HIGH (ref 70–99)

## 2019-02-15 LAB — SARS CORONAVIRUS 2 BY RT PCR (HOSPITAL ORDER, PERFORMED IN ~~LOC~~ HOSPITAL LAB): SARS Coronavirus 2: NEGATIVE

## 2019-02-15 LAB — CK: Total CK: 8576 U/L — ABNORMAL HIGH (ref 49–397)

## 2019-02-15 LAB — PROTIME-INR
INR: 2.4 — ABNORMAL HIGH (ref 0.8–1.2)
Prothrombin Time: 26 seconds — ABNORMAL HIGH (ref 11.4–15.2)

## 2019-02-15 MED ORDER — AMLODIPINE BESYLATE 5 MG PO TABS
5.0000 mg | ORAL_TABLET | Freq: Every day | ORAL | Status: DC
  Administered 2019-02-16 – 2019-02-18 (×3): 5 mg via ORAL
  Filled 2019-02-15 (×3): qty 1

## 2019-02-15 MED ORDER — ACETAMINOPHEN 325 MG PO TABS
650.0000 mg | ORAL_TABLET | Freq: Four times a day (QID) | ORAL | Status: AC
  Administered 2019-02-15 – 2019-02-16 (×2): 650 mg via ORAL
  Filled 2019-02-15 (×2): qty 2

## 2019-02-15 MED ORDER — ONDANSETRON HCL 4 MG PO TABS: 4.0000 mg | ORAL_TABLET | Freq: Four times a day (QID) | ORAL | Status: DC | PRN

## 2019-02-15 MED ORDER — METOPROLOL TARTRATE 50 MG PO TABS
50.0000 mg | ORAL_TABLET | Freq: Two times a day (BID) | ORAL | Status: DC
  Administered 2019-02-15 – 2019-02-18 (×6): 50 mg via ORAL
  Filled 2019-02-15 (×6): qty 1

## 2019-02-15 MED ORDER — SODIUM CHLORIDE 0.9 % IV BOLUS (SEPSIS)
1000.0000 mL | Freq: Once | INTRAVENOUS | Status: AC
  Administered 2019-02-15: 1000 mL via INTRAVENOUS

## 2019-02-15 MED ORDER — ONDANSETRON HCL 4 MG/2ML IJ SOLN: 4.0000 mg | Freq: Four times a day (QID) | INTRAMUSCULAR | Status: DC | PRN

## 2019-02-15 MED ORDER — WARFARIN - PHARMACIST DOSING INPATIENT
Freq: Every day | Status: DC
  Administered 2019-02-16: 18:00:00

## 2019-02-15 MED ORDER — ATORVASTATIN CALCIUM 40 MG PO TABS
40.0000 mg | ORAL_TABLET | Freq: Every day | ORAL | Status: DC
  Administered 2019-02-16 – 2019-02-18 (×3): 40 mg via ORAL
  Filled 2019-02-15 (×3): qty 1

## 2019-02-15 MED ORDER — INSULIN ASPART 100 UNIT/ML ~~LOC~~ SOLN
0.0000 [IU] | Freq: Three times a day (TID) | SUBCUTANEOUS | Status: DC
  Administered 2019-02-16 (×2): 3 [IU] via SUBCUTANEOUS
  Administered 2019-02-17: 1 [IU] via SUBCUTANEOUS
  Administered 2019-02-17: 3 [IU] via SUBCUTANEOUS
  Administered 2019-02-17 – 2019-02-18 (×2): 2 [IU] via SUBCUTANEOUS

## 2019-02-15 MED ORDER — WARFARIN SODIUM 5 MG PO TABS
5.0000 mg | ORAL_TABLET | Freq: Once | ORAL | Status: AC
  Administered 2019-02-15: 5 mg via ORAL
  Filled 2019-02-15: qty 1

## 2019-02-15 MED ORDER — SODIUM CHLORIDE 0.9 % IV SOLN
INTRAVENOUS | Status: AC
  Administered 2019-02-15: 22:00:00 via INTRAVENOUS

## 2019-02-15 NOTE — ED Triage Notes (Signed)
Per EMS- last night pt went into his crawl space and was sitting on a bucket and fell backwards. Pt was unable to get up due to pain. Pt stayed on the floor of the crawl space until today around 1pm when he was found by his son in law. Pt has pain to hips, bruising to shoulder blades and dizziness. Pt was able to ambulate with assistance X 2, was positive for orthostatic changes with EMS. CBG 207. Pt covered in stool, states he is very thirsty. A&OX4.

## 2019-02-15 NOTE — ED Provider Notes (Addendum)
[facility] EMERGENCY DEPARTMENT Provider Note   CSN: [id number] Arrival date & time: 02/15/19  1407    History   Chief Complaint Chief Complaint  Patient presents with   Fall   Dizziness    HPI Colin Newman is a 85 y.o. male.     HPI Patient presents approximately 26 hours after being found essentially trapped in a crawl space. Patient states that he was in his usual state of health, yesterday, when he went into his crawl space to address a housing issue. He notes that he had a minor fall from a bucket, but was unable to crawl out of the space, and not until today, approximately 26 hours later was a found by a family member. Patient self denies focal pain, states that he feels sore all over, has worsening soreness in his lower extremities where he has bilateral replacements. Patient states that he takes his Coumadin regularly, though course did not take any medication today due to his central entrapment beneath his house.   Past Medical History:  Diagnosis Date   Constipation    Diabetes mellitus type 2, noninsulin dependent ([place])        Dyslipidemia    Hx with intolerance to statin therapy   Hypertension    Obesity    Single vessel coronary disease    s/p stent LAD 2005    Patient Active Problem List   Diagnosis Date Noted   AKI (acute kidney injury) ([place]) 02/15/2019   Rhabdomyolysis 02/15/2019   Atrial fibrillation ([place]) 04/15/2018   Long term (current) use of anticoagulants 04/15/2018   Colon polyps 04/12/2017   Nephropathy 11/10/2016   Mobitz type 1 second degree atrioventricular block 02/07/2016   Pre-ulcerative calluses 03/16/2015   Right foot drop 03/16/2015   CAD (coronary artery disease), native coronary artery 09/17/2014   Eustachian tube dysfunction 10/28/2013   Hearing loss 10/28/2013   Obesity (BMI 30.0-34.9) 08/30/2012   Chest wall pain 08/21/2012   Drug rash 11/21/2011   Dyslipidemia 07/26/2011    Type 2 diabetes mellitus ([place]) 06/23/2011   ACTINIC KERATOSIS 06/11/2008   ERECTILE DYSFUNCTION 05/21/2008   CORONARY ARTERY DISEASE, S/P PTCA 02/06/2008   Essential hypertension 12/13/2006   HEMORRHOIDS, NOS 12/13/2006   BPH 12/13/2006   OSTEOARTHRITIS, LOWER LEG 12/13/2006   BACK PAIN, LOW 12/13/2006    Past Surgical History:  Procedure Laterality Date   BACK SURGERY     2 previous   BILATERAL KNEE ARTHROSCOPY     CARDIOVASCULAR STRESS TEST  07-20-2009   EF 73%   CARDIOVERSION N/A 07/09/2018   Procedure: CARDIOVERSION;  Surgeon: Sanda Klein, MD;  Location: [facility];  Service: Cardiovascular;  Laterality: N/A;   CORONARY ANGIOPLASTY WITH STENT PLACEMENT  2005   ESOPHAGOGASTRODUODENOSCOPY N/A 04/09/2018   Procedure: ESOPHAGOGASTRODUODENOSCOPY (EGD);  Surgeon: [name], MD;  Location: [name];  Service: Endoscopy;  Laterality: N/A;   FINGER SURGERY Right 2018   right 5th finger   HOT HEMOSTASIS N/A 04/09/2018   Procedure: HOT HEMOSTASIS (ARGON PLASMA COAGULATION/BICAP);  Surgeon: [name], MD;  Location: [name];  Service: Endoscopy;  Laterality: N/A;   OTHER SURGICAL HISTORY     Stent in the proximal left anterior descending in 2005        Home Medications    Prior to Admission medications   Medication Sig Start Date End Date Taking? Authorizing Provider  acetaminophen (TYLENOL) 325 MG tablet Take 650 mg by mouth every 6 (six) hours as needed for mild  pain.   Yes [provider]  amLODipine (NORVASC) 5 MG tablet Take 1 tablet (5 mg total) by mouth daily. 09/09/18  Yes [name], FNP  atorvastatin (LIPITOR) 40 MG tablet Take 1 tablet (40 mg total) by mouth daily. 09/09/18  Yes [name], FNP  Cyanocobalamin (B-12 COMPLIANCE INJECTION) 1000 MCG/ML KIT Inject as directed every 30 (thirty) days.   Yes [provider]  furosemide (LASIX) 20 MG tablet Take 1 tablet (20 mg total) by mouth  daily. Patient taking differently: Take 20 mg by mouth daily as needed for fluid or edema.  09/09/18  Yes [name], FNP  metFORMIN (GLUCOPHAGE) 1000 MG tablet Take 1 tablet (1,000 mg total) by mouth 2 (two) times daily. 09/09/18  Yes [name], FNP  metoprolol tartrate (LOPRESSOR) 50 MG tablet Take 1 tablet (50 mg total) by mouth 2 (two) times daily. 10/03/18  Yes [name], FNP  Multiple Vitamin (MULTI-VITAMIN DAILY PO) Take 1 tablet by mouth daily.    Yes [provider]  nitroGLYCERIN (NITROSTAT) 0.4 MG SL tablet DISSOLVE ONE TABLET UNDER THE TONGUE EVERY 5 MINUTES AS NEEDED FOR CHEST PAIN.  DO NOT EXCEED A TOTAL OF 3 DOSES IN 15 MINUTES Patient taking differently: Place 0.4 mg under the tongue every 5 (five) minutes as needed for chest pain.  09/10/17  Yes [name], MD  Omega-3 Fatty Acids (FISH OIL) 1200 MG CAPS Take 1,200 mg by mouth daily.    Yes [provider]  warfarin (COUMADIN) 5 MG tablet TAKE 1 TABLET BY MOUTH ONCE DAILY OR AS DIRECTED BY COUMADIN CLINIC Patient taking differently: Take 2.5-5 mg by mouth See admin instructions. 2.58m on Sunday and 577mon all other days OR AS DIRECTED BY COUMADIN CLINIC 08/13/18  Yes JoMartiniquePeter M, MD    Family History Family History  Problem Relation Age of Onset   Pulmonary embolism Mother    Cancer Father     Social History Social History   Tobacco Use   Smoking status: Former Smoker   Smokeless tobacco: Current User    Types: Chew  Substance Use Topics   Alcohol use: Yes    Alcohol/week: 0.0 standard drinks    Comment: Rarely has a beer   Drug use: Never     Allergies   Lisinopril; Losartan; and Hctz [hydrochlorothiazide]   Review of Systems Review of Systems  Constitutional:       Per HPI, otherwise negative  HENT:       Per HPI, otherwise negative  Respiratory:       Per HPI, otherwise negative  Cardiovascular:       Per HPI, otherwise  negative  Gastrointestinal: Negative for vomiting.  Endocrine:       Negative aside from HPI  Genitourinary:       Neg aside from HPI   Musculoskeletal:       Per HPI, otherwise negative  Skin: Positive for wound.  Neurological: Positive for weakness. Negative for syncope.     Physical Exam Updated Vital Signs BP (!) 167/75    Pulse (!) 132    Temp (!) 97.4 F (36.3 C)    Resp 18    Ht _0  (1.803 m)    Wt 108.9 kg    SpO2 98%    BMI 33.47 kg/m   Physical Exam Vitals signs and nursing note reviewed.  Constitutional:      Appearance: He is well-developed.  Comments: Chronically ill-appearing elderly male in no distress speaking clearly.  HENT:     Head: Normocephalic and atraumatic.  Eyes:     Conjunctiva/sclera: Conjunctivae normal.  Cardiovascular:     Rate and Rhythm: Normal rate and regular rhythm.  Pulmonary:     Effort: Pulmonary effort is normal. No respiratory distress.     Breath sounds: No stridor.  Abdominal:     General: There is no distension.  Skin:    General: Skin is warm and dry.     Comments: Multiple areas of ecchymosis, minor abrasions, no obvious wounds in need of repair.  Neurological:     Mental Status: He is alert and oriented to person, place, and time.      ED Treatments / Results  Labs (all labs ordered are listed, but only abnormal results are displayed) Labs Reviewed  CBC WITH DIFFERENTIAL/PLATELET - Abnormal; Notable for the following components:      Result Value   WBC 20.7 (*)    RBC 3.47 (*)    Hemoglobin 11.3 (*)    HCT 35.4 (*)    MCV 102.0 (*)    RDW 15.7 (*)    Neutro Abs 16.8 (*)    Monocytes Absolute 2.6 (*)    Abs Immature Granulocytes 0.25 (*)    All other components within normal limits  COMPREHENSIVE METABOLIC PANEL - Abnormal; Notable for the following components:   Potassium 5.3 (*)    CO2 16 (*)    Glucose, Bld 171 (*)    BUN 44 (*)    Creatinine, Ser 1.91 (*)    Calcium 8.5 (*)    Albumin 3.4 (*)     AST 161 (*)    ALT 54 (*)    Total Bilirubin 1.8 (*)    GFR calc non Af Amer 32 (*)    GFR calc Af Amer 37 (*)    Anion gap 19 (*)    All other components within normal limits  CK - Abnormal; Notable for the following components:   Total CK 8,576 (*)    All other components within normal limits  PROTIME-INR - Abnormal; Notable for the following components:   Prothrombin Time 26.0 (*)    INR 2.4 (*)    All other components within normal limits  CBG MONITORING, ED - Abnormal; Notable for the following components:   Glucose-Capillary 165 (*)    All other components within normal limits  PROTIME-INR  CK   ECG: afib rate 94, substantial artefact, non-specific st and t waves changes, abnormal ecg  Radiology Ct Head Wo Contrast  Result Date: 02/15/2019 CLINICAL DATA:  Fall. EXAM: CT HEAD WITHOUT CONTRAST CT CERVICAL SPINE WITHOUT CONTRAST TECHNIQUE: Multidetector CT imaging of the head and cervical spine was performed following the standard protocol without intravenous contrast. Multiplanar CT image reconstructions of the cervical spine were also generated. COMPARISON:  None. FINDINGS: CT HEAD FINDINGS Brain: No evidence of acute infarction, hemorrhage, hydrocephalus, extra-axial collection or mass lesion/mass effect. Mild chronic small vessel ischemic change in the periventricular white matter. Vascular: Atherosclerotic calcification. Skull: No acute or aggressive finding. Sinuses/Orbits: Bilateral cataract resection. CT CERVICAL SPINE FINDINGS Alignment: Straightening of the cervical spine Skull base and vertebrae: Negative for acute fracture Soft tissues and spinal canal: No prevertebral fluid or swelling. No visible canal hematoma. Disc levels: Extensive spondylosis with multi-level ankylosis. There is generalized degenerative facet spurring. Notable advanced biforaminal impingement at C3-4 and C4-5. Upper chest: Negative IMPRESSION: 1. No acute intracranial finding.  2. Negative for cervical  spine fracture. 3. Advanced spondylosis with multi-level ankylosis. Electronically Signed   By: Monte Fantasia M.D.   On: 02/15/2019 15:19   Ct Cervical Spine Wo Contrast  Result Date: 02/15/2019 CLINICAL DATA:  Fall. EXAM: CT HEAD WITHOUT CONTRAST CT CERVICAL SPINE WITHOUT CONTRAST TECHNIQUE: Multidetector CT imaging of the head and cervical spine was performed following the standard protocol without intravenous contrast. Multiplanar CT image reconstructions of the cervical spine were also generated. COMPARISON:  None. FINDINGS: CT HEAD FINDINGS Brain: No evidence of acute infarction, hemorrhage, hydrocephalus, extra-axial collection or mass lesion/mass effect. Mild chronic small vessel ischemic change in the periventricular white matter. Vascular: Atherosclerotic calcification. Skull: No acute or aggressive finding. Sinuses/Orbits: Bilateral cataract resection. CT CERVICAL SPINE FINDINGS Alignment: Straightening of the cervical spine Skull base and vertebrae: Negative for acute fracture Soft tissues and spinal canal: No prevertebral fluid or swelling. No visible canal hematoma. Disc levels: Extensive spondylosis with multi-level ankylosis. There is generalized degenerative facet spurring. Notable advanced biforaminal impingement at C3-4 and C4-5. Upper chest: Negative IMPRESSION: 1. No acute intracranial finding. 2. Negative for cervical spine fracture. 3. Advanced spondylosis with multi-level ankylosis. Electronically Signed   By: Monte Fantasia M.D.   On: 02/15/2019 15:19    Procedures Procedures (including critical care time)  Medications Ordered in ED Medications  warfarin (COUMADIN) tablet 5 mg (has no administration in time range)  Warfarin - Pharmacist Dosing Inpatient (has no administration in time range)  sodium chloride 0.9 % bolus 1,000 mL (1,000 mLs Intravenous New Bag/Given 02/15/19 1621)     Initial Impression / Assessment and Plan / ED Course  I have reviewed the triage vital signs  and the nursing notes.  Pertinent labs & imaging results that were available during my care of the patient were reviewed by me and considered in my medical decision making (see chart for details).        5:13 PM Patient aware of all findings, including substantially elevated CK. He has begun to receive fluid resuscitation. Findings otherwise somewhat reassuring, with radiographic studies not demonstrating new intracranial injury, and there is low suspicion for fracture given the patient's endorsement of pain being similar to his typical, absence of substantial mechanism of fall. Given his elevated CK, concern for rhabdomyolysis, as well as elevated creatinine, the patient will require admission for further fluid resuscitation, monitoring, management.   Final Clinical Impressions(s) / ED Diagnoses   Final diagnoses:  AKI (acute kidney injury) ([place])  Non-traumatic rhabdomyolysis      Carmin Muskrat, MD 02/15/19 1714    Carmin Muskrat, MD 02/24/19 [phone]

## 2019-02-15 NOTE — ED Notes (Signed)
Patient transported to X-ray 

## 2019-02-15 NOTE — H&P (Addendum)
History and Physical    Colin Newman AOZ:[id number] DOB: [birth year] DOA: 02/15/2019  Referring MD/NP/PA: EDP PCP:  Patient coming from: Home  Chief Complaint: Fall, unable to get up  HPI: Colin Newman is a 85 y.o. male with medical history significant for CAD, remote LAD stent in 2005, paroxysmal atrial fibrillation on Coumadin, history of hypertension, dyslipidemia, diabetes was in his usual state of health yesterday when he went down into his crawl space to a fix housekeeping issue, he reportedly fell while down there and due to the space constraints found himself trapped unable to get out.  He tried to crawl out of the space without much success was subsequently found by his son-in-law 26 hours later today and brought to the emergency room. -Patient reports being sore all over, muscle stiffness in his arms and legs. -He denies any loss of consciousness, he denies chest pain shortness of breath palpitations.  No fever chills nausea or vomiting, patient reports having one episode of diarrhea while down  in the crawl space. ED Course: Work-up in the emergency room noted mild acute kidney injury creatinine of 1.9 up from baseline of 1.1, slight worsening of AST ALT as well, mild hyperkalemia, elevated CK, leukocytosis and metabolic acidosis  Review of Systems: As per HPI otherwise 14 point review of systems negative.   Past Medical History:  Diagnosis Date  . Constipation   . Diabetes mellitus type 2, noninsulin dependent ([place])       . Dyslipidemia    Hx with intolerance to statin therapy  . Hypertension   . Obesity   . Single vessel coronary disease    s/p stent LAD 2005    Past Surgical History:  Procedure Laterality Date  . BACK SURGERY     2 previous  . BILATERAL KNEE ARTHROSCOPY    . CARDIOVASCULAR STRESS TEST  07-20-2009   EF 73%  . CARDIOVERSION N/A 07/09/2018   Procedure: CARDIOVERSION;  Surgeon: Sanda Klein, MD;  Location: [facility];  Service: Cardiovascular;   Laterality: N/A;  . CORONARY ANGIOPLASTY WITH STENT PLACEMENT  2005  . ESOPHAGOGASTRODUODENOSCOPY N/A 04/09/2018   Procedure: ESOPHAGOGASTRODUODENOSCOPY (EGD);  Surgeon: [name], MD;  Location: [name];  Service: Endoscopy;  Laterality: N/A;  . FINGER SURGERY Right 2018   right 5th finger  . HOT HEMOSTASIS N/A 04/09/2018   Procedure: HOT HEMOSTASIS (ARGON PLASMA COAGULATION/BICAP);  Surgeon: [name], MD;  Location: [name];  Service: Endoscopy;  Laterality: N/A;  . OTHER SURGICAL HISTORY     Stent in the proximal left anterior descending in 2005     reports that he has quit smoking. His smokeless tobacco use includes chew. He reports current alcohol use. He reports that he does not use drugs.  Allergies  Allergen Reactions  . Lisinopril Hives, Itching and Cough  . Losartan Hives and Other (See Comments)    Hives, itching, swelling of face and tongue associated w/ taking this medication.  [name] Hctz [Hydrochlorothiazide] Itching    Family History  Problem Relation Age of Onset  . Pulmonary embolism Mother   . Cancer Father      Prior to Admission medications   Medication Sig Start Date End Date Taking? Authorizing Provider  amLODipine (NORVASC) 5 MG tablet Take 1 tablet (5 mg total) by mouth daily. 09/09/18   [name], FNP  atorvastatin (LIPITOR) 40 MG tablet Take 1 tablet (40 mg total) by mouth daily. 09/09/18   [name], FNP  Cyanocobalamin (B-12 COMPLIANCE  INJECTION) 1000 MCG/ML KIT Inject as directed every 30 (thirty) days.    [provider]  furosemide (LASIX) 20 MG tablet Take 1 tablet (20 mg total) by mouth daily. 09/09/18   [name], FNP  metFORMIN (GLUCOPHAGE) 1000 MG tablet Take 1 tablet (1,000 mg total) by mouth 2 (two) times daily. 09/09/18   [name], FNP  metoprolol tartrate (LOPRESSOR) 50 MG tablet Take 1 tablet (50 mg total) by mouth 2 (two) times daily. 10/03/18   [name], FNP  Multiple Vitamin (MULTI-VITAMIN DAILY PO) Take 1 tablet by mouth daily.     [provider]  nitroGLYCERIN (NITROSTAT) 0.4 MG SL tablet DISSOLVE ONE TABLET UNDER THE TONGUE EVERY 5 MINUTES AS NEEDED FOR CHEST PAIN.  DO NOT EXCEED A TOTAL OF 3 DOSES IN 15 MINUTES Patient taking differently: Place 0.4 mg under the tongue every 5 (five) minutes as needed for chest pain.  09/10/17   [name], MD  Omega-3 Fatty Acids (FISH OIL) 1200 MG CAPS Take 1,200 mg by mouth daily.     [provider]  warfarin (COUMADIN) 5 MG tablet TAKE 1 TABLET BY MOUTH ONCE DAILY OR AS DIRECTED BY COUMADIN CLINIC 08/13/18   [name], Peter M, MD    Physical Exam: Vitals:   02/15/19 1418 02/15/19 1422 02/15/19 1430 02/15/19 1618  BP:  126/84 (!) 146/71   Pulse:  92 95 (!) 135  Resp:  18  16  Temp:  (!) 97.4 F (36.3 C)    SpO2:  100% 98% 99%  Weight: 108.9 kg     Height: _0  (1.803 m)         Constitutional: Pleasant elderly male, laying in bed, alert awake oriented x4, bruised all over Vitals:   02/15/19 1418 02/15/19 1422 02/15/19 1430 02/15/19 1618  BP:  126/84 (!) 146/71   Pulse:  92 95 (!) 135  Resp:  18  16  Temp:  (!) 97.4 F (36.3 C)    SpO2:  100% 98% 99%  Weight: 108.9 kg     Height: _1  (1.803 m)      Eyes: PERRL, lids and conjunctivae normal ENMT: Oral mucosa is dry Neck: normal, supple Respiratory: Distant breath sounds, otherwise clear bilaterally Cardiovascular: Regular rate and rhythm, no murmurs / rubs / gallops Abdomen: soft, non tender, Bowel sounds positive.  Musculoskeletal: Slightly painful range of motion at the level of both hips Ext: No edema Skin: Extensive bruising and ecchymosis especially in both upper extremities, multiple scratches and scabs noted in both legs, feet especially left Neurologic: Moves all extremities, no localizing signs Psychiatric: Normal judgment and insight. Alert and oriented x 3. Normal mood.   Labs  on Admission: I have personally reviewed following labs and imaging studies  CBC: Recent Labs  Lab 02/15/19 1442  WBC 20.7*  NEUTROABS 16.8*  HGB 11.3*  HCT 35.4*  MCV 102.0*  PLT 361   Basic Metabolic Panel: Recent Labs  Lab 02/15/19 1442  NA 139  K 5.3*  CL 104  CO2 16*  GLUCOSE 171*  BUN 44*  CREATININE 1.91*  CALCIUM 8.5*   GFR: Estimated Creatinine Clearance: 36.8 mL/min (A) (by C-G formula based on SCr of 1.91 mg/dL (H)). Liver Function Tests: Recent Labs  Lab 02/15/19 1442  AST 161*  ALT 54*  ALKPHOS 105  BILITOT 1.8*  PROT 6.5  ALBUMIN 3.4*   No results for input(s): LIPASE, AMYLASE in the last 168 hours. No  results for input(s): AMMONIA in the last 168 hours. Coagulation Profile: Recent Labs  Lab 02/15/19 1442  INR 2.4*   Cardiac Enzymes: Recent Labs  Lab 02/15/19 1442  CKTOTAL 8,576*   BNP (last 3 results) No results for input(s): PROBNP in the last 8760 hours. HbA1C: No results for input(s): HGBA1C in the last 72 hours. CBG: Recent Labs  Lab 02/15/19 1442  GLUCAP 165*   Lipid Profile: No results for input(s): CHOL, HDL, LDLCALC, TRIG, CHOLHDL, LDLDIRECT in the last 72 hours. Thyroid Function Tests: No results for input(s): TSH, T4TOTAL, FREET4, T3FREE, THYROIDAB in the last 72 hours. Anemia Panel: No results for input(s): VITAMINB12, FOLATE, FERRITIN, TIBC, IRON, RETICCTPCT in the last 72 hours. Urine analysis: No results found for: COLORURINE, APPEARANCEUR, LABSPEC, PHURINE, GLUCOSEU, HGBUR, BILIRUBINUR, KETONESUR, PROTEINUR, UROBILINOGEN, NITRITE, LEUKOCYTESUR Sepsis Labs: _0 (procalcitonin:4,lacticidven:4) )No results found for this or any previous visit (from the past 240 hour(s)).   Radiological Exams on Admission: Ct Head Wo Contrast  Result Date: 02/15/2019 CLINICAL DATA:  Fall. EXAM: CT HEAD WITHOUT CONTRAST CT CERVICAL SPINE WITHOUT CONTRAST TECHNIQUE: Multidetector CT imaging of the head and cervical spine was  performed following the standard protocol without intravenous contrast. Multiplanar CT image reconstructions of the cervical spine were also generated. COMPARISON:  None. FINDINGS: CT HEAD FINDINGS Brain: No evidence of acute infarction, hemorrhage, hydrocephalus, extra-axial collection or mass lesion/mass effect. Mild chronic small vessel ischemic change in the periventricular white matter. Vascular: Atherosclerotic calcification. Skull: No acute or aggressive finding. Sinuses/Orbits: Bilateral cataract resection. CT CERVICAL SPINE FINDINGS Alignment: Straightening of the cervical spine Skull base and vertebrae: Negative for acute fracture Soft tissues and spinal canal: No prevertebral fluid or swelling. No visible canal hematoma. Disc levels: Extensive spondylosis with multi-level ankylosis. There is generalized degenerative facet spurring. Notable advanced biforaminal impingement at C3-4 and C4-5. Upper chest: Negative IMPRESSION: 1. No acute intracranial finding. 2. Negative for cervical spine fracture. 3. Advanced spondylosis with multi-level ankylosis. Electronically Signed   By: Monte Fantasia M.D.   On: 02/15/2019 15:19   Ct Cervical Spine Wo Contrast  Result Date: 02/15/2019 CLINICAL DATA:  Fall. EXAM: CT HEAD WITHOUT CONTRAST CT CERVICAL SPINE WITHOUT CONTRAST TECHNIQUE: Multidetector CT imaging of the head and cervical spine was performed following the standard protocol without intravenous contrast. Multiplanar CT image reconstructions of the cervical spine were also generated. COMPARISON:  None. FINDINGS: CT HEAD FINDINGS Brain: No evidence of acute infarction, hemorrhage, hydrocephalus, extra-axial collection or mass lesion/mass effect. Mild chronic small vessel ischemic change in the periventricular white matter. Vascular: Atherosclerotic calcification. Skull: No acute or aggressive finding. Sinuses/Orbits: Bilateral cataract resection. CT CERVICAL SPINE FINDINGS Alignment: Straightening of the  cervical spine Skull base and vertebrae: Negative for acute fracture Soft tissues and spinal canal: No prevertebral fluid or swelling. No visible canal hematoma. Disc levels: Extensive spondylosis with multi-level ankylosis. There is generalized degenerative facet spurring. Notable advanced biforaminal impingement at C3-4 and C4-5. Upper chest: Negative IMPRESSION: 1. No acute intracranial finding. 2. Negative for cervical spine fracture. 3. Advanced spondylosis with multi-level ankylosis. Electronically Signed   By: Monte Fantasia M.D.   On: 02/15/2019 15:19    EKG: Independently reviewed.  EKG noted atrial flutter with no acute ST-T wave changes  Assessment/Plan  Fall -Appears mechanical, no loss of consciousness, reports being in good health without any symptoms prior to falling in the crawl space -We will ask PT and OT to evaluate -CT of head and spine without acute findings -We will check  x-rays of both hips -schedule tylenol for diffuse muscle soreness x3doses  Acute kidney injury/rhabdomyolysis -Creatinine is 1.9 on baseline of 1.1 -We will hydrate with IV fluids today -Hold Lasix -Check CK in a.m.  Elevated AST and ALT -Suspect this could be secondary to rhabdomyolysis -Hydrate and monitor, abdominal exam is benign  History of CAD -Stable, denies any chest pain or ACS symptoms, continue beta-blocker -Not on antiplatelet agents due to anticoagulation -hold statin acutely  Chronic atrial fibrillation/a flutter -Heart rate is controlled, continue Coumadin and metoprolol -chads2vasc score > 3  Leukocytosis -reactive from fall, being down for 26 hours, as well as component of hemoconcentration from dehydration, diuretics -Hydrate, monitor -Afebrile and nontoxic at this time    Type 2 diabetes mellitus (HCC)  -hold Metformin, sliding scale insulin for now    DVT prophylaxis: Coumadin Code Status: DNR Family Communication: No family at bedside Disposition Plan: Home  tomorrow if stable Consults called: None Admission status: Observation  Domenic Polite MD [facility]  02/15/2019, 4:51 PM

## 2019-02-15 NOTE — ED Notes (Signed)
ED TO INPATIENT HANDOFF REPORT  ED Nurse Name and Phone #: [phone]  S Name/Age/Gender Colin Newman 85 y.o. male Room/Bed: 030C/030C  Code Status   Code Status: Not on file  Home/SNF/Other Home Patient oriented to: self, place, time and situation Is this baseline? Yes   Triage Complete: Triage complete  Chief Complaint Fall  Triage Note Per EMS- last night pt went into his crawl space and was sitting on a bucket and fell backwards. Pt was unable to get up due to pain. Pt stayed on the floor of the crawl space until today around 1pm when he was found by his son in law. Pt has pain to hips, bruising to shoulder blades and dizziness. Pt was able to ambulate with assistance X 2, was positive for orthostatic changes with EMS. CBG 207. Pt covered in stool, states he is very thirsty. A&OX4.    Allergies Allergies  Allergen Reactions  . Lisinopril Hives, Itching and Cough  . Losartan Hives and Other (See Comments)    Hives, itching, swelling of face and tongue associated w/ taking this medication.  [name] Hctz [Hydrochlorothiazide] Itching    Level of Care/Admitting Diagnosis ED Disposition    ED Disposition Condition [facility] Area: [place] [[id number]]  Level of Care: Med-Surg [16]  I expect the patient will be discharged within 24 hours: Yes  LOW acuity---Tx typically complete <24 hrs---ACUTE conditions typically can be evaluated <24 hours---LABS likely to return to acceptable levels <24 hours---IS near functional baseline---EXPECTED to return to current living arrangement---NOT newly hypoxic: Meets criteria for 5C-Observation unit  Covid Evaluation: N/A  Diagnosis: AKI (acute kidney injury) [facility]) [[id number]]  Admitting Physician: Domenic Polite [[id number]]  Attending Physician: Domenic Polite [[id number]]  PT Class (Do Not Modify): Observation [104]  PT Acc Code (Do Not Modify): Observation [10022]       B Medical/Surgery History Past Medical  History:  Diagnosis Date  . Constipation   . Diabetes mellitus type 2, noninsulin dependent ([place])       . Dyslipidemia    Hx with intolerance to statin therapy  . Hypertension   . Obesity   . Single vessel coronary disease    s/p stent LAD 2005   Past Surgical History:  Procedure Laterality Date  . BACK SURGERY     2 previous  . BILATERAL KNEE ARTHROSCOPY    . CARDIOVASCULAR STRESS TEST  07-20-2009   EF 73%  . CARDIOVERSION N/A 07/09/2018   Procedure: CARDIOVERSION;  Surgeon: Sanda Klein, MD;  Location: [place];  Service: Cardiovascular;  Laterality: N/A;  . CORONARY ANGIOPLASTY WITH STENT PLACEMENT  2005  . ESOPHAGOGASTRODUODENOSCOPY N/A 04/09/2018   Procedure: ESOPHAGOGASTRODUODENOSCOPY (EGD);  Surgeon: [name], MD;  Location: [name];  Service: Endoscopy;  Laterality: N/A;  . FINGER SURGERY Right 2018   right 5th finger  . HOT HEMOSTASIS N/A 04/09/2018   Procedure: HOT HEMOSTASIS (ARGON PLASMA COAGULATION/BICAP);  Surgeon: [name], MD;  Location: [name];  Service: Endoscopy;  Laterality: N/A;  . OTHER SURGICAL HISTORY     Stent in the proximal left anterior descending in 2005     A IV Location/Drains/Wounds Patient Lines/Drains/Airways Status   Active Line/Drains/Airways    Name:   Placement date:   Placement time:   Site:   Days:   Peripheral IV 02/15/19 Left Antecubital   02/15/19    1422    Antecubital   less than 1  Intake/Output Last 24 hours No intake or output data in the 24 hours ending 02/15/19 1729  Labs/Imaging Results for orders placed or performed during the hospital encounter of 02/15/19 (from the past 48 hour(s))  CBC with Differential     Status: Abnormal   Collection Time: 02/15/19  2:42 PM  Result Value Ref Range   WBC 20.7 (H) 4.0 - 10.5 K/uL    Comment: WHITE COUNT CONFIRMED ON SMEAR   RBC 3.47 (L) 4.22 - 5.81 MIL/uL   Hemoglobin 11.3 (L) 13.0 - 17.0 g/dL   HCT 35.4 (L) 39.0 - 52.0 %   MCV 102.0 (H)  80.0 - 100.0 fL   MCH 32.6 26.0 - 34.0 pg   MCHC 31.9 30.0 - 36.0 g/dL   RDW 15.7 (H) 11.5 - 15.5 %   Platelets 240 150 - 400 K/uL   nRBC 0.2 0.0 - 0.2 %   Neutrophils Relative % 82 %   Neutro Abs 16.8 (H) 1.7 - 7.7 K/uL   Lymphocytes Relative 4 %   Lymphs Abs 0.9 0.7 - 4.0 K/uL   Monocytes Relative 12 %   Monocytes Absolute 2.6 (H) 0.1 - 1.0 K/uL   Eosinophils Relative 1 %   Eosinophils Absolute 0.2 0.0 - 0.5 K/uL   Basophils Relative 0 %   Basophils Absolute 0.0 0.0 - 0.1 K/uL   Immature Granulocytes 1 %   Abs Immature Granulocytes 0.25 (H) 0.00 - 0.07 K/uL    Comment: Performed at [facility], [address], [place], [place] [address]  Comprehensive metabolic panel     Status: Abnormal   Collection Time: 02/15/19  2:42 PM  Result Value Ref Range   Sodium 139 135 - 145 mmol/L   Potassium 5.3 (H) 3.5 - 5.1 mmol/L   Chloride 104 98 - 111 mmol/L   CO2 16 (L) 22 - 32 mmol/L   Glucose, Bld 171 (H) 70 - 99 mg/dL   BUN 44 (H) 8 - 23 mg/dL   Creatinine, Ser 1.91 (H) 0.61 - 1.24 mg/dL   Calcium 8.5 (L) 8.9 - 10.3 mg/dL   Total Protein 6.5 6.5 - 8.1 g/dL   Albumin 3.4 (L) 3.5 - 5.0 g/dL   AST 161 (H) 15 - 41 U/L   ALT 54 (H) 0 - 44 U/L   Alkaline Phosphatase 105 38 - 126 U/L   Total Bilirubin 1.8 (H) 0.3 - 1.2 mg/dL   GFR calc non Af Amer 32 (L) >60 mL/min   GFR calc Af Amer 37 (L) >60 mL/min   Anion gap 19 (H) 5 - 15    Comment: Performed at [facility], [place] [address] [place], [place] [address]  CK     Status: Abnormal   Collection Time: 02/15/19  2:42 PM  Result Value Ref Range   Total CK 8,576 (H) 49 - 397 U/L    Comment: RESULTS CONFIRMED BY MANUAL DILUTION Performed at [facility], [place] [address], [place], [place] [address]   CBG monitoring, ED     Status: Abnormal   Collection Time: 02/15/19  2:42 PM  Result Value Ref Range   Glucose-Capillary 165 (H) 70 - 99 mg/dL  Protime-INR     Status: Abnormal   Collection Time: 02/15/19  2:42 PM  Result  Value Ref Range   Prothrombin Time 26.0 (H) 11.4 - 15.2 seconds   INR 2.4 (H) 0.8 - 1.2    Comment: (NOTE) INR goal varies based on device  and disease states. Performed at [facility], [place] [address], [place], [place] [address]    Ct Head Wo Contrast  Result Date: 02/15/2019 CLINICAL DATA:  Fall. EXAM: CT HEAD WITHOUT CONTRAST CT CERVICAL SPINE WITHOUT CONTRAST TECHNIQUE: Multidetector CT imaging of the head and cervical spine was performed following the standard protocol without intravenous contrast. Multiplanar CT image reconstructions of the cervical spine were also generated. COMPARISON:  None. FINDINGS: CT HEAD FINDINGS Brain: No evidence of acute infarction, hemorrhage, hydrocephalus, extra-axial collection or mass lesion/mass effect. Mild chronic small vessel ischemic change in the periventricular white matter. Vascular: Atherosclerotic calcification. Skull: No acute or aggressive finding. Sinuses/Orbits: Bilateral cataract resection. CT CERVICAL SPINE FINDINGS Alignment: Straightening of the cervical spine Skull base and vertebrae: Negative for acute fracture Soft tissues and spinal canal: No prevertebral fluid or swelling. No visible canal hematoma. Disc levels: Extensive spondylosis with multi-level ankylosis. There is generalized degenerative facet spurring. Notable advanced biforaminal impingement at C3-4 and C4-5. Upper chest: Negative IMPRESSION: 1. No acute intracranial finding. 2. Negative for cervical spine fracture. 3. Advanced spondylosis with multi-level ankylosis. Electronically Signed   By: Monte Fantasia M.D.   On: 02/15/2019 15:19   Ct Cervical Spine Wo Contrast  Result Date: 02/15/2019 CLINICAL DATA:  Fall. EXAM: CT HEAD WITHOUT CONTRAST CT CERVICAL SPINE WITHOUT CONTRAST TECHNIQUE: Multidetector CT imaging of the head and cervical spine was performed following the standard protocol without intravenous contrast. Multiplanar CT image reconstructions of the cervical spine  were also generated. COMPARISON:  None. FINDINGS: CT HEAD FINDINGS Brain: No evidence of acute infarction, hemorrhage, hydrocephalus, extra-axial collection or mass lesion/mass effect. Mild chronic small vessel ischemic change in the periventricular white matter. Vascular: Atherosclerotic calcification. Skull: No acute or aggressive finding. Sinuses/Orbits: Bilateral cataract resection. CT CERVICAL SPINE FINDINGS Alignment: Straightening of the cervical spine Skull base and vertebrae: Negative for acute fracture Soft tissues and spinal canal: No prevertebral fluid or swelling. No visible canal hematoma. Disc levels: Extensive spondylosis with multi-level ankylosis. There is generalized degenerative facet spurring. Notable advanced biforaminal impingement at C3-4 and C4-5. Upper chest: Negative IMPRESSION: 1. No acute intracranial finding. 2. Negative for cervical spine fracture. 3. Advanced spondylosis with multi-level ankylosis. Electronically Signed   By: Monte Fantasia M.D.   On: 02/15/2019 15:19    Pending Labs Unresulted Labs (From admission, onward)    Start     Ordered   02/16/19 0500  Protime-INR  Daily,   R     02/15/19 1656   02/16/19 0500  CK  Tomorrow morning,   R     02/15/19 1708   02/15/19 1718  SARS Coronavirus 2 (CEPHEID - Performed in [facility] hospital lab), Hosp Order  (Asymptomatic Patients Labs with Precautions )  Once,   R    Question:  Rule Out  Answer:  Yes   02/15/19 1717   Signed and Held  CBC  Tomorrow morning,   R     Signed and Held   Signed and Held  Comprehensive metabolic panel  Tomorrow morning,   R     Signed and Held          Vitals/Pain Today's Vitals   02/15/19 1422 02/15/19 1430 02/15/19 1618 02/15/19 1655  BP: 126/84 (!) 146/71  (!) 167/75  Pulse: 92 95 (!) 135 (!) 132  Resp: 18  16 18   Temp: (!) 97.4 F (36.3 C)     SpO2: 100% 98% 99% 98%  Weight:  Height:      PainSc:        Isolation Precautions No active  isolations  Medications Medications  Warfarin - Pharmacist Dosing Inpatient (has no administration in time range)  insulin aspart (novoLOG) injection 0-9 Units (has no administration in time range)  sodium chloride 0.9 % bolus 1,000 mL (1,000 mLs Intravenous New Bag/Given 02/15/19 1621)  warfarin (COUMADIN) tablet 5 mg (5 mg Oral Given 02/15/19 1717)    Mobility walks High fall risk   Focused Assessments Cardiac Assessment Handoff:    Lab Results  Component Value Date   CKTOTAL 8,576 (H) 02/15/2019   No results found for: DDIMER Does the Patient currently have chest pain? No       R Recommendations: See Admitting Provider Note  Report given to:   Additional Notes:.

## 2019-02-15 NOTE — ED Notes (Signed)
Navigator RN note  Pt resting, aware of plans for admission. Pt verbalized he was able to talk to 3 family members and had no further needs at this time to contact them. Comfort measures.

## 2019-02-15 NOTE — Progress Notes (Signed)
ANTICOAGULATION CONSULT NOTE - Initial Consult  Pharmacy Consult for warfarin Indication: atrial fibrillation  Allergies  Allergen Reactions  . Lisinopril Hives, Itching and Cough  . Losartan Hives and Other (See Comments)    Hives, itching, swelling of face and tongue associated w/ taking this medication.  [name] Hctz [Hydrochlorothiazide] Itching    Patient Measurements: Height: 5\' 11"  (180.3 cm) Weight: 240 lb (108.9 kg) IBW/kg (Calculated) : 75.3  Vital Signs: Temp: 97.4 F (36.3 C) (05/02 1422) BP: 146/71 (05/02 1430) Pulse Rate: 135 (05/02 1618)  Labs: Recent Labs    02/15/19 1442  HGB 11.3*  HCT 35.4*  PLT 240  LABPROT 26.0*  INR 2.4*  CREATININE 1.91*  CKTOTAL 8,576*    Estimated Creatinine Clearance: 36.8 mL/min (A) (by C-G formula based on SCr of 1.91 mg/dL (H)).   Medical History: Past Medical History:  Diagnosis Date  . Constipation   . Diabetes mellitus type 2, noninsulin dependent ([place])       . Dyslipidemia    Hx with intolerance to statin therapy  . Hypertension   . Obesity   . Single vessel coronary disease    s/p stent LAD 2005   Assessment: 85 yom presented to the ED after being found down for a prolonged period after a minor fall. He is on chronic warfarin for history of afib. Warfarin to continue here. No bleeding noted, including negative head/spine CT. INR is therapeutic at 2.4.   Goal of Therapy:  INR 2-3 Monitor platelets by anticoagulation protocol: Yes   Plan:  Warfarin 5mg  PO x 1 tonight Daily INR  Cameren Earnest, Rande Lawman 02/15/2019,4:56 PM

## 2019-02-16 DIAGNOSIS — I480 Paroxysmal atrial fibrillation: Secondary | ICD-10-CM | POA: Diagnosis present

## 2019-02-16 DIAGNOSIS — D72829 Elevated white blood cell count, unspecified: Secondary | ICD-10-CM | POA: Diagnosis present

## 2019-02-16 DIAGNOSIS — R740 Nonspecific elevation of levels of transaminase and lactic acid dehydrogenase [LDH]: Secondary | ICD-10-CM | POA: Diagnosis present

## 2019-02-16 DIAGNOSIS — E119 Type 2 diabetes mellitus without complications: Secondary | ICD-10-CM | POA: Diagnosis present

## 2019-02-16 DIAGNOSIS — M549 Dorsalgia, unspecified: Secondary | ICD-10-CM | POA: Diagnosis present

## 2019-02-16 DIAGNOSIS — Z87891 Personal history of nicotine dependence: Secondary | ICD-10-CM | POA: Diagnosis not present

## 2019-02-16 DIAGNOSIS — E860 Dehydration: Secondary | ICD-10-CM | POA: Diagnosis present

## 2019-02-16 DIAGNOSIS — T796XXA Traumatic ischemia of muscle, initial encounter: Secondary | ICD-10-CM | POA: Diagnosis present

## 2019-02-16 DIAGNOSIS — N179 Acute kidney failure, unspecified: Secondary | ICD-10-CM | POA: Diagnosis not present

## 2019-02-16 DIAGNOSIS — M25559 Pain in unspecified hip: Secondary | ICD-10-CM | POA: Diagnosis not present

## 2019-02-16 DIAGNOSIS — E872 Acidosis: Secondary | ICD-10-CM | POA: Diagnosis present

## 2019-02-16 DIAGNOSIS — Z7984 Long term (current) use of oral hypoglycemic drugs: Secondary | ICD-10-CM | POA: Diagnosis not present

## 2019-02-16 DIAGNOSIS — E785 Hyperlipidemia, unspecified: Secondary | ICD-10-CM | POA: Diagnosis present

## 2019-02-16 DIAGNOSIS — Z79899 Other long term (current) drug therapy: Secondary | ICD-10-CM | POA: Diagnosis not present

## 2019-02-16 DIAGNOSIS — M160 Bilateral primary osteoarthritis of hip: Secondary | ICD-10-CM | POA: Diagnosis present

## 2019-02-16 DIAGNOSIS — Z66 Do not resuscitate: Secondary | ICD-10-CM | POA: Diagnosis present

## 2019-02-16 DIAGNOSIS — I10 Essential (primary) hypertension: Secondary | ICD-10-CM | POA: Diagnosis present

## 2019-02-16 DIAGNOSIS — I4892 Unspecified atrial flutter: Secondary | ICD-10-CM | POA: Diagnosis present

## 2019-02-16 DIAGNOSIS — Z955 Presence of coronary angioplasty implant and graft: Secondary | ICD-10-CM | POA: Diagnosis not present

## 2019-02-16 DIAGNOSIS — E875 Hyperkalemia: Secondary | ICD-10-CM | POA: Diagnosis present

## 2019-02-16 DIAGNOSIS — I482 Chronic atrial fibrillation, unspecified: Secondary | ICD-10-CM | POA: Diagnosis present

## 2019-02-16 DIAGNOSIS — I2510 Atherosclerotic heart disease of native coronary artery without angina pectoris: Secondary | ICD-10-CM | POA: Diagnosis present

## 2019-02-16 DIAGNOSIS — Z1159 Encounter for screening for other viral diseases: Secondary | ICD-10-CM | POA: Diagnosis not present

## 2019-02-16 DIAGNOSIS — Z7901 Long term (current) use of anticoagulants: Secondary | ICD-10-CM | POA: Diagnosis not present

## 2019-02-16 LAB — CBC
HCT: 30.9 % — ABNORMAL LOW (ref 39.0–52.0)
Hemoglobin: 10.1 g/dL — ABNORMAL LOW (ref 13.0–17.0)
MCH: 32.4 pg (ref 26.0–34.0)
MCHC: 32.7 g/dL (ref 30.0–36.0)
MCV: 99 fL (ref 80.0–100.0)
Platelets: 205 10*3/uL (ref 150–400)
RBC: 3.12 MIL/uL — ABNORMAL LOW (ref 4.22–5.81)
RDW: 15.9 % — ABNORMAL HIGH (ref 11.5–15.5)
WBC: 15.1 10*3/uL — ABNORMAL HIGH (ref 4.0–10.5)
nRBC: 0 % (ref 0.0–0.2)

## 2019-02-16 LAB — GLUCOSE, CAPILLARY
Glucose-Capillary: 216 mg/dL — ABNORMAL HIGH (ref 70–99)
Glucose-Capillary: 121 mg/dL — ABNORMAL HIGH (ref 70–99)
Glucose-Capillary: 228 mg/dL — ABNORMAL HIGH (ref 70–99)
Glucose-Capillary: 158 mg/dL — ABNORMAL HIGH (ref 70–99)

## 2019-02-16 LAB — COMPREHENSIVE METABOLIC PANEL
ALT: 57 U/L — ABNORMAL HIGH (ref 0–44)
AST: 178 U/L — ABNORMAL HIGH (ref 15–41)
Albumin: 2.8 g/dL — ABNORMAL LOW (ref 3.5–5.0)
Alkaline Phosphatase: 89 U/L (ref 38–126)
Anion gap: 12 (ref 5–15)
BUN: 47 mg/dL — ABNORMAL HIGH (ref 8–23)
CO2: 21 mmol/L — ABNORMAL LOW (ref 22–32)
Calcium: 8.2 mg/dL — ABNORMAL LOW (ref 8.9–10.3)
Chloride: 105 mmol/L (ref 98–111)
Creatinine, Ser: 1.55 mg/dL — ABNORMAL HIGH (ref 0.61–1.24)
GFR calc Af Amer: 47 mL/min — ABNORMAL LOW (ref >60)
GFR calc non Af Amer: 41 mL/min — ABNORMAL LOW (ref >60)
Glucose, Bld: 146 mg/dL — ABNORMAL HIGH (ref 70–99)
Potassium: 5.4 mmol/L — ABNORMAL HIGH (ref 3.5–5.1)
Sodium: 138 mmol/L (ref 135–145)
Total Bilirubin: 1.7 mg/dL — ABNORMAL HIGH (ref 0.3–1.2)
Total Protein: 5.6 g/dL — ABNORMAL LOW (ref 6.5–8.1)

## 2019-02-16 LAB — PROTIME-INR
INR: 2.8 — ABNORMAL HIGH (ref 0.8–1.2)
Prothrombin Time: 29.3 seconds — ABNORMAL HIGH (ref 11.4–15.2)

## 2019-02-16 LAB — CK: Total CK: 8880 U/L — ABNORMAL HIGH (ref 49–397)

## 2019-02-16 MED ORDER — WARFARIN SODIUM 2.5 MG PO TABS
2.5000 mg | ORAL_TABLET | Freq: Once | ORAL | Status: AC
  Administered 2019-02-16: 2.5 mg via ORAL
  Filled 2019-02-16: qty 1

## 2019-02-16 NOTE — Progress Notes (Signed)
ANTICOAGULATION CONSULT NOTE - Initial Consult  Pharmacy Consult for warfarin Indication: atrial fibrillation  Allergies  Allergen Reactions  . Lisinopril Hives, Itching and Cough  . Losartan Hives and Other (See Comments)    Hives, itching, swelling of face and tongue associated w/ taking this medication.  [name] Hctz [Hydrochlorothiazide] Itching    Patient Measurements: Height: 5\' 11"  (180.3 cm) Weight: 240 lb (108.9 kg) IBW/kg (Calculated) : 75.3  Vital Signs: Temp: 98.1 F (36.7 C) (05/03 0529) Temp Source: Oral (05/02 2100) BP: 152/70 (05/03 0529) Pulse Rate: 70 (05/03 0529)  Labs: Recent Labs    02/15/19 1442 02/16/19 0320  HGB 11.3* 10.1*  HCT 35.4* 30.9*  PLT 240 205  LABPROT 26.0* 29.3*  INR 2.4* 2.8*  CREATININE 1.91* 1.55*  CKTOTAL 8,576* 8,880*    Estimated Creatinine Clearance: 45.3 mL/min (A) (by C-G formula based on SCr of 1.55 mg/dL (H)).   Medical History: Past Medical History:  Diagnosis Date  . Constipation   . Diabetes mellitus type 2, noninsulin dependent ([place])       . Dyslipidemia    Hx with intolerance to statin therapy  . Hypertension   . Obesity   . Single vessel coronary disease    s/p stent LAD 2005   Assessment: 85 yom presented to the ED after being found down for a prolonged period after a minor fall. He is on chronic warfarin for history of afib. Warfarin to continue here. No bleeding noted, including negative head/spine CT.  INR remains therapeutic at 2.8. Hgb 10.1, plts ok.   Home regimen: 2.5mg  PO Sunday and 5mg  on all other days  Goal of Therapy:  INR 2-3 Monitor platelets by anticoagulation protocol: Yes   Plan:  Warfarin 2.5mg  PO x 1 tonight Daily INR, s/sx bleeding, CBC  Juanell Fairly, PharmD PGY1 Pharmacy Resident 02/16/2019 7:56 AM

## 2019-02-16 NOTE — Evaluation (Signed)
Occupational Therapy Evaluation Patient Details Name: Colin Newman MRN: [id number] DOB: [birth year] Today's Date: 02/16/2019    History of Present Illness Pt is an 85 y.o. male admitted 02/15/19 after fall, found stuck in crawl space by family 26 hours later. Found to have mild AKI, likely rhabdomyolysis. PMH includes CAD, afib on Coumadin, HTN, DM.    Clinical Impression   PTA patient independent and driving.  Admitted for above and limited by problem list below, including impaired balance, decreased activity tolerance and generalized weakness.  Patient requires mod assist for bed mobility, min assist for basic transfers, min guard for toielting/grooming standing, min assist for LB ADLs and min-setup assist for UB ADLs.  He lives alone and plans to see if family can stay with him at discharge. He will benefit from continued OT services while admitted and after dc at [facility] level in order to maximize independence with ADLs/mobility.      Follow Up Recommendations  Home health OT;Supervision/Assistance - 24 hour    Equipment Recommendations  3 in 1 bedside commode    Recommendations for Other Services       Precautions / Restrictions Precautions Precautions: Fall Restrictions Weight Bearing Restrictions: No      Mobility Bed Mobility Overal bed mobility: Needs Assistance Bed Mobility: Supine to Sit     Supine to sit: Mod assist     General bed mobility comments: ModA for UE support to assist trunk elevation; pt limited by soreness  Transfers Overall transfer level: Needs assistance Equipment used: Rolling walker (2 wheeled) Transfers: Sit to/from Stand Sit to Stand: Min assist         General transfer comment: min assist to power up to standing, for balance and safety    Balance Overall balance assessment: Needs assistance Sitting-balance support: No upper extremity supported;Feet supported Sitting balance-Leahy Scale: Fair     Standing balance support: Bilateral  upper extremity supported;During functional activity Standing balance-Leahy Scale: Poor Standing balance comment: Reliant on UE support                           ADL either performed or assessed with clinical judgement   ADL Overall ADL's : Needs assistance/impaired     Grooming: Wash/dry hands;Oral care;Min guard;Standing Grooming Details (indicate cue type and reason): initate tasks standing, transitioned to sitting due to fatigue Upper Body Bathing: Set up;Sitting   Lower Body Bathing: Minimal assistance;Sit to/from stand Lower Body Bathing Details (indicate cue type and reason): decreased reach to B LEs Upper Body Dressing : Sitting;Minimal assistance   Lower Body Dressing: Minimal assistance;Sit to/from stand Lower Body Dressing Details (indicate cue type and reason): decreased reach to B LEs and impaired balance Toilet Transfer: Minimal assistance;Ambulation;RW   Toileting- Water quality scientist and Hygiene: Min guard;Sit to/from stand       Functional mobility during ADLs: Min guard;Rolling walker General ADL Comments: limited by generalized weakness, impaired act tolerance and balance      Vision Baseline Vision/History: Wears glasses Wears Glasses: At all times Patient Visual Report: No change from baseline Vision Assessment?: No apparent visual deficits     Perception     Praxis      Pertinent Vitals/Pain Pain Assessment: Faces Faces Pain Scale: Hurts a little bit Pain Location: Generalized Pain Descriptors / Indicators: Sore Pain Intervention(s): Limited activity within patient's tolerance;Repositioned     Hand Dominance Right   Extremity/Trunk Assessment Upper Extremity Assessment Upper Extremity Assessment: Generalized weakness;RUE deficits/detail;LUE deficits/detail  RUE Deficits / Details: soreness limiting overhead > 90 degress FF motion; noted 5th digit injury (chronic) with limited flexion  LUE Deficits / Details: soreness limiting  overhead > 90 degress FF motion   Lower Extremity Assessment Lower Extremity Assessment: Defer to PT evaluation       Communication Communication Communication: No difficulties   Cognition Arousal/Alertness: Awake/alert Behavior During Therapy: WFL for tasks assessed/performed Overall Cognitive Status: Within Functional Limits for tasks assessed                                     General Comments  C/o dizziness upon sitting, BP 137/69 after sitting EOB ~2 min    Exercises Other Exercises Other Exercises: Encouraged gentle ROM (especially in BUEs) to regain strength/motion and decrease soreness   Shoulder Instructions      Home Living Family/patient expects to be discharged to:: Private residence Living Arrangements: Alone Available Help at Discharge: Family;Available PRN/intermittently Type of Home: House Home Access: Stairs to enter [employer] of Steps: 8 Entrance Stairs-Rails: Right Home Layout: One level     Bathroom Shower/Tub: Occupational psychologist: Standard     Home Equipment: Cane - single point   Additional Comments: Pt to call daughter to see if there is someone he can stay with for a few days      Prior Functioning/Environment Level of Independence: Independent        Comments: Intermittent use of SPC. Indep with all activities, except now has someone else mow his yard. Daughter's family lives 1 mile down road. H/o R foot drop since back sxs (had OP PT in 10/2018, but does not like wearing AFO); this caused fall resulting in this admission; reports using "stick" to help don/doff socks        OT Problem List: Decreased strength;Decreased range of motion;Decreased activity tolerance;Impaired balance (sitting and/or standing);Decreased safety awareness;Decreased knowledge of use of DME or AE;Pain;Impaired UE functional use      OT Treatment/Interventions: Self-care/ADL training;Therapeutic exercise;DME and/or AE  instruction;Therapeutic activities;Patient/family education;Balance training    OT Goals(Current goals can be found in the care plan section) Acute Rehab OT Goals Patient Stated Goal: Stay with family a few days before going home OT Goal Formulation: With patient Time For Goal Achievement: 03/02/19 Potential to Achieve Goals: Good  OT Frequency: Min 2X/week   Barriers to D/C:            Co-evaluation              AM-PAC OT "6 Clicks" Daily Activity     Outcome Measure Help from another person eating meals?: A Little Help from another person taking care of personal grooming?: A Little Help from another person toileting, which includes using toliet, bedpan, or urinal?: A Little Help from another person bathing (including washing, rinsing, drying)?: A Little Help from another person to put on and taking off regular upper body clothing?: A Little Help from another person to put on and taking off regular lower body clothing?: A Little 6 Click Score: 18   End of Session Equipment Utilized During Treatment: Gait belt;Rolling walker Nurse Communication: Mobility status  Activity Tolerance: Patient tolerated treatment well Patient left: in bed;with call bell/phone within reach;with bed alarm set  OT Visit Diagnosis: Other abnormalities of gait and mobility (R26.89);Muscle weakness (generalized) (M62.81);History of falling (Z91.81)  Time: 5945-8592 OT Time Calculation (min): 27 min Charges:  OT General Charges $OT Visit: 1 Visit OT Evaluation $OT Eval Moderate Complexity: 1 Mod OT Treatments $Self Care/Home Management : 8-22 mins  Delight Stare, OT Acute Rehabilitation Services Pager [phone] Office [phone]    Delight Stare 02/16/2019, 12:57 PM

## 2019-02-16 NOTE — Evaluation (Signed)
Physical Therapy Evaluation Patient Details Name: AHIJAH DEVERY MRN: [id number] DOB: 1932/11/04 Today's Date: 02/16/2019   History of Present Illness  Pt is an 85 y.o. male admitted 02/15/19 after fall, found stuck in crawl space by family 26 hours later. Found to have mild AKI, likely rhabdomyolysis. PMH includes CAD, afib on Coumadin, HTN, DM.     Clinical Impression  Pt presents with an overall decrease in functional mobility secondary to above. PTA, pt indep, lives alone and remains active; h/o R foot drop which caused this fall, pt with AFO he does not enjoy wearing. Today, pt requiring increased assist due to pain/weakness, ambulatory with RW and min guard for balance. Pt agrees he is not currently safe to return home; hopeful he can stay with family or friend for a few days before home alone. Will follow acutely to address established goals.    Follow Up Recommendations Home health PT;Supervision for mobility/OOB    Equipment Recommendations  Rolling walker with 5" wheels    Recommendations for Other Services       Precautions / Restrictions Precautions Precautions: Fall Restrictions Weight Bearing Restrictions: No      Mobility  Bed Mobility Overal bed mobility: Needs Assistance Bed Mobility: Supine to Sit     Supine to sit: Mod assist     General bed mobility comments: ModA for UE support to assist trunk elevation; pt limited by soreness  Transfers Overall transfer level: Needs assistance Equipment used: Rolling walker (2 wheeled) Transfers: Sit to/from Stand Sit to Stand: Min guard         General transfer comment: Reliant on momentum to power into standing; min guard for balance  Ambulation/Gait Ambulation/Gait assistance: Min guard Gait Distance (Feet): 140 Feet Assistive device: Rolling walker (2 wheeled) Gait Pattern/deviations: Step-through pattern;Decreased stride length;Trunk flexed;Decreased dorsiflexion - right Gait velocity: Decreased Gait  velocity interpretation: 1.31 - 2.62 ft/sec, indicative of limited community ambulator General Gait Details: Slow, fatigued gait with RW and close min guard for balance. Noted some shakiness in extremeties, but no knee buckling or overt instability. H/o R foot drop which pt is able to compensate for well with increased hip flexion  Stairs            Wheelchair Mobility    Modified Rankin (Stroke Patients Only)       Balance Overall balance assessment: Needs assistance   Sitting balance-Leahy Scale: Fair       Standing balance-Leahy Scale: Poor Standing balance comment: Reliant on UE support                             Pertinent Vitals/Pain Pain Assessment: Faces Faces Pain Scale: Hurts a little bit Pain Location: Generalized Pain Descriptors / Indicators: Sore Pain Intervention(s): Limited activity within patient's tolerance;Monitored during session    Home Living Family/patient expects to be discharged to:: Private residence Living Arrangements: Alone Available Help at Discharge: Family;Available PRN/intermittently Type of Home: House Home Access: Stairs to enter Entrance Stairs-Rails: Right Entrance Stairs-Number of Steps: 8 Home Layout: One level Home Equipment: Cane - single point Additional Comments: Pt to call daughter to see if there is someone he can stay with for a few days    Prior Function Level of Independence: Independent         Comments: Intermittent use of SPC. Indep with all activities, except now has someone else mow his yard. Daughter's family lives 1 mile down road. H/o R foot  drop since back sxs (had OP PT in 10/2018, but does not like wearing AFO); this caused fall resulting in this admission     Hand Dominance        Extremity/Trunk Assessment   Upper Extremity Assessment Upper Extremity Assessment: Generalized weakness(Difficulty with shoulder flex/abd >90' due to soreness)    Lower Extremity Assessment Lower  Extremity Assessment: Generalized weakness       Communication   Communication: No difficulties  Cognition Arousal/Alertness: Awake/alert Behavior During Therapy: WFL for tasks assessed/performed Overall Cognitive Status: Within Functional Limits for tasks assessed                                        General Comments General comments (skin integrity, edema, etc.): C/o dizziness upon sitting, BP 137/69 after sitting EOB ~2 min    Exercises Other Exercises Other Exercises: Encouraged gentle ROM (especially in BUEs) to regain strength/motion and decrease soreness   Assessment/Plan    PT Assessment Patient needs continued PT services  PT Problem List Decreased strength;Decreased range of motion;Decreased activity tolerance;Decreased balance;Decreased mobility;Decreased knowledge of use of DME       PT Treatment Interventions DME instruction;Gait training;Stair training;Functional mobility training;Therapeutic activities;Therapeutic exercise;Balance training;Patient/family education    PT Goals (Current goals can be found in the Care Plan section)  Acute Rehab PT Goals Patient Stated Goal: Stay with family a few days before going home PT Goal Formulation: With patient Time For Goal Achievement: 03/02/19 Potential to Achieve Goals: Good    Frequency Min 3X/week   Barriers to discharge Decreased caregiver support      Co-evaluation               AM-PAC PT "6 Clicks" Mobility  Outcome Measure Help needed turning from your back to your side while in a flat bed without using bedrails?: A Lot Help needed moving from lying on your back to sitting on the side of a flat bed without using bedrails?: A Lot Help needed moving to and from a bed to a chair (including a wheelchair)?: A Little Help needed standing up from a chair using your arms (e.g., wheelchair or bedside chair)?: A Little Help needed to walk in hospital room?: A Little Help needed climbing 3-5  steps with a railing? : A Little 6 Click Score: 16    End of Session Equipment Utilized During Treatment: Gait belt Activity Tolerance: Patient tolerated treatment well Patient left: in chair;with call bell/phone within reach;with chair alarm set Nurse Communication: Mobility status PT Visit Diagnosis: Other abnormalities of gait and mobility (R26.89);Muscle weakness (generalized) (M62.81)    Time: 1610-9604 PT Time Calculation (min) (ACUTE ONLY): 27 min   Charges:   PT Evaluation $PT Eval Moderate Complexity: 1 Mod PT Treatments $Gait Training: 8-22 mins   Mabeline Caras, PT, DPT Acute Rehabilitation Services  Pager [phone] Office [place] 02/16/2019, 10:14 AM

## 2019-02-16 NOTE — Progress Notes (Signed)
PROGRESS NOTE    Colin Newman  KGU:[id number] DOB: [birth year] DOA: 02/15/2019 PCP: [name], FNP    Brief Narrative:  85 year old gentleman with history of coronary artery disease, remote LAD stenting 2005, paroxysmal A. fib on Coumadin, hypertension, hyperlipidemia and diabetes admitted with rhabdomyolysis.  Patient lives independent.  He went to his crawlspace to fix something, he got a stock for 26 hours in the crawl space and found by family member.  Reported muscle stiffness.  Found to have acute kidney injury, leukocytosis, hyperkalemia and elevated CK of 8500.  Treated with IV fluids with clinical improvement.   Assessment & Plan:   Principal Problem:   AKI (acute kidney injury) ([place]) Active Problems:   BACK PAIN, LOW   Type 2 diabetes mellitus (HCC)   CAD (coronary artery disease), native coronary artery   Atrial fibrillation (HCC)   Rhabdomyolysis  Acute kidney injury with traumatic rhabdomyolysis and hyperkalemia: Presented with creatinine 1.9, treated with IV fluids with improving levels.  We will continue to monitor.  Adequate urine output. CK 8500-8800.  Since patient has improving renal functions, will continue maintenance IV fluids today.  Creatinine kinase is anticipated to increase in serum before decreasing. Potassium is normalizing.  History of coronary artery disease: Fairly stable.  Without chest pain.  Continue beta-blockers.  Holding a statin.  Chronic atrial fibrillation and flutter: Heart rate is controlled.  Therapeutic on Coumadin.  Leukocytosis:  With no evidence of acute infection.  Improving with hydration.  Type 2 diabetes on metformin:  Holding metformin.  Keep on sliding scale insulin.  Patient continues to need hospitalization for monitoring and IV fluid administration as well as close monitoring of his laboratory parameters.  Anticipate hospitalization more than 2 midnights.  Will change to inpatient.   DVT prophylaxis:  Coumadin Code Status: DNR Family Communication: No family at bedside.  Patient to communicate with family Disposition Plan: Anticipate discharge home with adequate clinical improvement.  Anticipate tomorrow.   Consultants:   None  Procedures:   None  Antimicrobials:   None   Subjective: Patient was seen and examined.  Denied any other overnight events.  Complains of some soreness on his legs.  Objective: Vitals:   02/15/19 1730 02/15/19 1745 02/15/19 2100 02/16/19 0529  BP: 138/62 (!) 144/77 139/90 (!) 152/70  Pulse: 90 97 (!) 101 70  Resp:   17 18  Temp:   (!) 97.3 F (36.3 C) 98.1 F (36.7 C)  TempSrc:   Oral   SpO2: 98% 95% 98% 99%  Weight:   108.9 kg   Height:   5\' 11"  (1.803 m)     Intake/Output Summary (Last 24 hours) at 02/16/2019 1200 Last data filed at 02/16/2019 0453 Gross per 24 hour  Intake 481.91 ml  Output -  Net 481.91 ml   Filed Weights   02/15/19 1418 02/15/19 2100  Weight: 108.9 kg 108.9 kg    Examination:  General exam: Appears calm and comfortable  Respiratory system: Clear to auscultation. Respiratory effort normal. Cardiovascular system: S1 & S2 heard, irregularly irregular. No JVD, murmurs, rubs, gallops or clicks. No pedal edema. Gastrointestinal system: Abdomen is nondistended, soft and nontender. No organomegaly or masses felt. Normal bowel sounds heard. Central nervous system: Alert and oriented. No focal neurological deficits. Extremities: Symmetric 5 x 5 power. Skin: Patient has some abrasions on his bilateral legs. Psychiatry: Judgement and insight appear normal. Mood & affect appropriate.     Data Reviewed: I have personally reviewed following labs  and imaging studies  CBC: Recent Labs  Lab 02/15/19 1442 02/16/19 0320  WBC 20.7* 15.1*  NEUTROABS 16.8*  --   HGB 11.3* 10.1*  HCT 35.4* 30.9*  MCV 102.0* 99.0  PLT 240 741   Basic Metabolic Panel: Recent Labs  Lab 02/15/19 1442 02/16/19 0320  NA 139 138  K 5.3*  5.4*  CL 104 105  CO2 16* 21*  GLUCOSE 171* 146*  BUN 44* 47*  CREATININE 1.91* 1.55*  CALCIUM 8.5* 8.2*   GFR: Estimated Creatinine Clearance: 45.3 mL/min (A) (by C-G formula based on SCr of 1.55 mg/dL (H)). Liver Function Tests: Recent Labs  Lab 02/15/19 1442 02/16/19 0320  AST 161* 178*  ALT 54* 57*  ALKPHOS 105 89  BILITOT 1.8* 1.7*  PROT 6.5 5.6*  ALBUMIN 3.4* 2.8*   No results for input(s): LIPASE, AMYLASE in the last 168 hours. No results for input(s): AMMONIA in the last 168 hours. Coagulation Profile: Recent Labs  Lab 02/15/19 1442 02/16/19 0320  INR 2.4* 2.8*   Cardiac Enzymes: Recent Labs  Lab 02/15/19 1442 02/16/19 0320  CKTOTAL 8,576* 8,880*   BNP (last 3 results) No results for input(s): PROBNP in the last 8760 hours. HbA1C: No results for input(s): HGBA1C in the last 72 hours. CBG: Recent Labs  Lab 02/15/19 1442 02/16/19 0803  GLUCAP 165* 216*   Lipid Profile: No results for input(s): CHOL, HDL, LDLCALC, TRIG, CHOLHDL, LDLDIRECT in the last 72 hours. Thyroid Function Tests: No results for input(s): TSH, T4TOTAL, FREET4, T3FREE, THYROIDAB in the last 72 hours. Anemia Panel: No results for input(s): VITAMINB12, FOLATE, FERRITIN, TIBC, IRON, RETICCTPCT in the last 72 hours. Sepsis Labs: No results for input(s): PROCALCITON, LATICACIDVEN in the last 168 hours.  Recent Results (from the past 240 hour(s))  SARS Coronavirus 2 (CEPHEID - Performed in [facility] hospital lab), Hosp Order     Status: None   Collection Time: 02/15/19  5:18 PM  Result Value Ref Range Status   SARS Coronavirus 2 NEGATIVE NEGATIVE Final    Comment: (NOTE) If result is NEGATIVE SARS-CoV-2 target nucleic acids are NOT DETECTED. The SARS-CoV-2 RNA is generally detectable in upper and lower  respiratory specimens during the acute phase of infection. The lowest  concentration of SARS-CoV-2 viral copies this assay can detect is 250  copies / mL. A negative result does  not preclude SARS-CoV-2 infection  and should not be used as the sole basis for treatment or other  patient management decisions.  A negative result may occur with  improper specimen collection / handling, submission of specimen other  than nasopharyngeal swab, presence of viral mutation(s) within the  areas targeted by this assay, and inadequate number of viral copies  (<250 copies / mL). A negative result must be combined with clinical  observations, patient history, and epidemiological information. If result is POSITIVE SARS-CoV-2 target nucleic acids are DETECTED. The SARS-CoV-2 RNA is generally detectable in upper and lower  respiratory specimens dur ing the acute phase of infection.  Positive  results are indicative of active infection with SARS-CoV-2.  Clinical  correlation with patient history and other diagnostic information is  necessary to determine patient infection status.  Positive results do  not rule out bacterial infection or co-infection with other viruses. If result is PRESUMPTIVE POSTIVE SARS-CoV-2 nucleic acids MAY BE PRESENT.   A presumptive positive result was obtained on the submitted specimen  and confirmed on repeat testing.  While 2019 novel coronavirus  (SARS-CoV-2) nucleic acids  may be present in the submitted sample  additional confirmatory testing may be necessary for epidemiological  and / or clinical management purposes  to differentiate between  SARS-CoV-2 and other Sarbecovirus currently known to infect humans.  If clinically indicated additional testing with an alternate test  methodology [phone]) is advised. The SARS-CoV-2 RNA is generally  detectable in upper and lower respiratory sp ecimens during the acute  phase of infection. The expected result is Negative. Fact Sheet for Patients:  StrictlyIdeas.no Fact Sheet for Healthcare Providers: BankingDealers.co.za This test is not yet approved or  cleared by the [place] FDA and has been authorized for detection and/or diagnosis of SARS-CoV-2 by FDA under an Emergency Use Authorization (EUA).  This EUA will remain in effect (meaning this test can be used) for the duration of the COVID-19 declaration under Section 564(b)(1) of the Act, 21 U.S.C. section 360bbb-3(b)(1), unless the authorization is terminated or revoked sooner. Performed at [facility], [place] [address], [place], [place] [address]          Radiology Studies: Ct Head Wo Contrast  Result Date: 02/15/2019 CLINICAL DATA:  Fall. EXAM: CT HEAD WITHOUT CONTRAST CT CERVICAL SPINE WITHOUT CONTRAST TECHNIQUE: Multidetector CT imaging of the head and cervical spine was performed following the standard protocol without intravenous contrast. Multiplanar CT image reconstructions of the cervical spine were also generated. COMPARISON:  None. FINDINGS: CT HEAD FINDINGS Brain: No evidence of acute infarction, hemorrhage, hydrocephalus, extra-axial collection or mass lesion/mass effect. Mild chronic small vessel ischemic change in the periventricular white matter. Vascular: Atherosclerotic calcification. Skull: No acute or aggressive finding. Sinuses/Orbits: Bilateral cataract resection. CT CERVICAL SPINE FINDINGS Alignment: Straightening of the cervical spine Skull base and vertebrae: Negative for acute fracture Soft tissues and spinal canal: No prevertebral fluid or swelling. No visible canal hematoma. Disc levels: Extensive spondylosis with multi-level ankylosis. There is generalized degenerative facet spurring. Notable advanced biforaminal impingement at C3-4 and C4-5. Upper chest: Negative IMPRESSION: 1. No acute intracranial finding. 2. Negative for cervical spine fracture. 3. Advanced spondylosis with multi-level ankylosis. Electronically Signed   By: Monte Fantasia M.D.   On: 02/15/2019 15:19   Ct Cervical Spine Wo Contrast  Result Date: 02/15/2019 CLINICAL DATA:  Fall.  EXAM: CT HEAD WITHOUT CONTRAST CT CERVICAL SPINE WITHOUT CONTRAST TECHNIQUE: Multidetector CT imaging of the head and cervical spine was performed following the standard protocol without intravenous contrast. Multiplanar CT image reconstructions of the cervical spine were also generated. COMPARISON:  None. FINDINGS: CT HEAD FINDINGS Brain: No evidence of acute infarction, hemorrhage, hydrocephalus, extra-axial collection or mass lesion/mass effect. Mild chronic small vessel ischemic change in the periventricular white matter. Vascular: Atherosclerotic calcification. Skull: No acute or aggressive finding. Sinuses/Orbits: Bilateral cataract resection. CT CERVICAL SPINE FINDINGS Alignment: Straightening of the cervical spine Skull base and vertebrae: Negative for acute fracture Soft tissues and spinal canal: No prevertebral fluid or swelling. No visible canal hematoma. Disc levels: Extensive spondylosis with multi-level ankylosis. There is generalized degenerative facet spurring. Notable advanced biforaminal impingement at C3-4 and C4-5. Upper chest: Negative IMPRESSION: 1. No acute intracranial finding. 2. Negative for cervical spine fracture. 3. Advanced spondylosis with multi-level ankylosis. Electronically Signed   By: Monte Fantasia M.D.   On: 02/15/2019 15:19   Dg Hips Bilat With Pelvis 2v  Result Date: 02/15/2019 CLINICAL DATA:  Bilateral hip pain EXAM: DG HIP (WITH OR WITHOUT PELVIS) 2V BILAT COMPARISON:  None. FINDINGS: Generalized osteopenia. No acute fracture or dislocation. Mild osteoarthritis of bilateral  hips. Lower lumbar spine spondylosis. Osteoarthritis of bilateral SI joints. Peripheral vascular atherosclerotic disease. IMPRESSION: No acute osseous injury of the bilateral hips. Electronically Signed   By: Kathreen Devoid   On: 02/15/2019 18:57        Scheduled Meds: . amLODipine  5 mg Oral Daily  . atorvastatin  40 mg Oral Daily  . insulin aspart  0-9 Units Subcutaneous TID WC  .  metoprolol tartrate  50 mg Oral BID  . warfarin  2.5 mg Oral ONCE-1800  . Warfarin - Pharmacist Dosing Inpatient   Does not apply q1800   Continuous Infusions: . sodium chloride 75 mL/hr at 02/15/19 2204     LOS: 0 days    Time spent: 25 minutes    Barb Merino, MD [facility] Pager [phone]  If 7PM-7AM, please contact night-coverage [email or web address] Password [email or web address] 02/16/2019, 12:00 PM

## 2019-02-16 NOTE — Care Management Obs Status (Signed)
[facility] NOTIFICATION   Patient Details  Name: Colin Newman MRN: [id number] Date of Birth: 12-07-1932   Medicare Observation Status Notification Given:  Yes    Ravinder Hofland, LCSW 02/16/2019, 11:41 AM

## 2019-02-17 ENCOUNTER — Encounter (HOSPITAL_COMMUNITY): Payer: Self-pay

## 2019-02-17 LAB — CBC WITH DIFFERENTIAL/PLATELET
Abs Immature Granulocytes: 0.06 10*3/uL (ref 0.00–0.07)
Basophils Absolute: 0 10*3/uL (ref 0.0–0.1)
Basophils Relative: 0 %
Eosinophils Absolute: 0.2 10*3/uL (ref 0.0–0.5)
Eosinophils Relative: 2 %
HCT: 29.3 % — ABNORMAL LOW (ref 39.0–52.0)
Hemoglobin: 9.6 g/dL — ABNORMAL LOW (ref 13.0–17.0)
Immature Granulocytes: 1 %
Lymphocytes Relative: 12 %
Lymphs Abs: 0.9 10*3/uL (ref 0.7–4.0)
MCH: 32.8 pg (ref 26.0–34.0)
MCHC: 32.8 g/dL (ref 30.0–36.0)
MCV: 100 fL (ref 80.0–100.0)
Monocytes Absolute: 1 10*3/uL (ref 0.1–1.0)
Monocytes Relative: 12 %
Neutro Abs: 6 10*3/uL (ref 1.7–7.7)
Neutrophils Relative %: 73 %
Platelets: 176 10*3/uL (ref 150–400)
RBC: 2.93 MIL/uL — ABNORMAL LOW (ref 4.22–5.81)
RDW: 15.9 % — ABNORMAL HIGH (ref 11.5–15.5)
WBC: 8.1 10*3/uL (ref 4.0–10.5)
nRBC: 0 % (ref 0.0–0.2)

## 2019-02-17 LAB — BASIC METABOLIC PANEL
Anion gap: 8 (ref 5–15)
BUN: 41 mg/dL — ABNORMAL HIGH (ref 8–23)
CO2: 21 mmol/L — ABNORMAL LOW (ref 22–32)
Calcium: 7.6 mg/dL — ABNORMAL LOW (ref 8.9–10.3)
Chloride: 112 mmol/L — ABNORMAL HIGH (ref 98–111)
Creatinine, Ser: 1.2 mg/dL (ref 0.61–1.24)
GFR calc Af Amer: >60 mL/min (ref >60)
GFR calc non Af Amer: 56 mL/min — ABNORMAL LOW (ref >60)
Glucose, Bld: 162 mg/dL — ABNORMAL HIGH (ref 70–99)
Potassium: 4.1 mmol/L (ref 3.5–5.1)
Sodium: 141 mmol/L (ref 135–145)

## 2019-02-17 LAB — GLUCOSE, CAPILLARY
Glucose-Capillary: 137 mg/dL — ABNORMAL HIGH (ref 70–99)
Glucose-Capillary: 189 mg/dL — ABNORMAL HIGH (ref 70–99)
Glucose-Capillary: 213 mg/dL — ABNORMAL HIGH (ref 70–99)
Glucose-Capillary: 126 mg/dL — ABNORMAL HIGH (ref 70–99)

## 2019-02-17 LAB — PROTIME-INR
INR: 2.7 — ABNORMAL HIGH (ref 0.8–1.2)
Prothrombin Time: 28.5 seconds — ABNORMAL HIGH (ref 11.4–15.2)

## 2019-02-17 LAB — CK: Total CK: 3623 U/L — ABNORMAL HIGH (ref 49–397)

## 2019-02-17 MED ORDER — SODIUM CHLORIDE 0.9 % IV SOLN
INTRAVENOUS | Status: DC
  Administered 2019-02-17: 20:00:00 via INTRAVENOUS
  Administered 2019-02-17: 1000 mL via INTRAVENOUS
  Administered 2019-02-18: 06:00:00 via INTRAVENOUS

## 2019-02-17 MED ORDER — ACETAMINOPHEN 325 MG PO TABS
650.0000 mg | ORAL_TABLET | Freq: Four times a day (QID) | ORAL | Status: DC | PRN
  Administered 2019-02-17: 650 mg via ORAL
  Filled 2019-02-17: qty 2

## 2019-02-17 MED ORDER — WARFARIN SODIUM 5 MG PO TABS
5.0000 mg | ORAL_TABLET | Freq: Once | ORAL | Status: AC
  Administered 2019-02-17: 5 mg via ORAL
  Filled 2019-02-17: qty 1

## 2019-02-17 NOTE — Progress Notes (Signed)
[place] for warfarin Indication: atrial fibrillation  Allergies  Allergen Reactions  . Lisinopril Hives, Itching and Cough  . Losartan Hives and Other (See Comments)    Hives, itching, swelling of face and tongue associated w/ taking this medication.  [name] Hctz [Hydrochlorothiazide] Itching    Patient Measurements: Height: 5\' 11"  (180.3 cm) Weight: 240 lb (108.9 kg) IBW/kg (Calculated) : 75.3  Vital Signs: Temp: 98.2 F (36.8 C) (05/04 0627) BP: 130/61 (05/04 0627) Pulse Rate: 64 (05/04 0627)  Labs: Recent Labs    02/15/19 1442 02/16/19 0320 02/17/19 0323  HGB 11.3* 10.1* 9.6*  HCT 35.4* 30.9* 29.3*  PLT 240 205 176  LABPROT 26.0* 29.3* 28.5*  INR 2.4* 2.8* 2.7*  CREATININE 1.91* 1.55* 1.20  CKTOTAL 8,576* 8,880* 3,623*    Estimated Creatinine Clearance: 58.5 mL/min (by C-G formula based on SCr of 1.2 mg/dL).    Assessment: 63 yom presented to the ED after being found down for a prolonged period after a minor fall. He is on chronic warfarin for history of afib. Warfarin to continue here. No bleeding noted, including negative head/spine CT.  INR remains therapeutic at 2.7. Hgb 9.6 Home regimen: 2.5mg  PO Sunday and 5mg  on all other days  Goal of Therapy:  INR 2-3 Monitor platelets by anticoagulation protocol: Yes   Plan:  Warfarin 5mg  PO x 1 tonight Daily INR, s/sx bleeding, CBC  Thank you Anette Guarneri, PharmD [phone] 02/17/2019 9:59 AM

## 2019-02-17 NOTE — TOC Initial Note (Addendum)
Transition of Care [facility]) - Initial/Assessment Note    Patient Details  Name: Colin Newman MRN: [id number] Date of Birth: 02-06-1933  Transition of Care [facility]) CM/SW Contact:    Sharin Mons, RN Phone Number: 02/17/2019, 2:20 PM  Clinical Narrative:       Presents with AKI/ Rhabdo. States from home alone. PTA independent with ADL's, no DME usage. States @ d/c daughter Kenton Kingfisher to provide 24/7 supervision/assist, short term.  Mammie Lorenzo (Daughter) Da[place] [phone]     Per PT/OT 's  evaluation / recommendations: Supervision/Assistance - 24 hour.  Pt agreeable to home health services. NCM provided choice list. Pt selected [facility]. NCM made referral with [facility]. [place] accepted. DME : rolling walker and  3 in 1/BSC will be delivered to bedside prior to d/c.  States has transportation to home.     NCM will continue to monitor for TOC needs ....     Mammie Lorenzo (Daughter) Yesenia [place] [phone]          Expected Discharge Plan: Home w Home Health Services Barriers to Discharge: Continued Medical Work up   Patient Goals and CMS Choice Patient states their goals for this hospitalization and ongoing recovery are:: go home and take care of myself CMS Medicare.gov Compare Post Acute Care list provided to:: Patient Choice offered to / list presented to : Patient  Expected Discharge Plan and Services Expected Discharge Plan: [place]   Discharge Planning Services: CM Consult Post Acute Care Choice: [place] arrangements for the past 2 months: Single Family Home                 DME Arranged: Walker rolling, 3in1/BSC DME Agency: [employer] Date DME Agency Contacted: 02/17/19 Time DME Agency Contacted: 46 Representative spoke with at DME Agency: [place]: PT, OT [place] Agency: [place]),  Date [place]: 02/17/19 Time [place]:  [place] Representative spoke with at [place]: Butch Penny  Prior Living Arrangements/Services Living arrangements for the past 2 months: [place] with:: Self(States daughter to assist with care once d/c) Patient language and need for interpreter reviewed:: Yes Do you feel safe going back to the place where you live?: Yes      Need for Family Participation in Patient Care: Yes (Comment) Care giver support system in place?: Yes (comment)   Criminal Activity/Legal Involvement Pertinent to Current Situation/Hospitalization: No - Comment as needed  Activities of Daily Living Home Assistive Devices/Equipment: None ADL Screening (condition at time of admission) Patient's cognitive ability adequate to safely complete daily activities?: Yes Is the patient deaf or have difficulty hearing?: No Does the patient have difficulty seeing, even when wearing glasses/contacts?: Yes Does the patient have difficulty concentrating, remembering, or making decisions?: No Patient able to express need for assistance with ADLs?: No Does the patient have difficulty dressing or bathing?: No Independently performs ADLs?: Yes (appropriate for developmental age) Does the patient have difficulty walking or climbing stairs?: No Weakness of Legs: None Weakness of Arms/Hands: None  Permission Sought/Granted Permission sought to share information with : Case Manager, Family Supports Permission granted to share information with : Yes, Verbal Permission Granted              Emotional Assessment Appearance:: Appears stated age Attitude/Demeanor/Rapport: Engaged Affect (typically observed): Accepting Orientation: : Oriented to Self, Oriented to  Time, Oriented to Place, Oriented  to Situation Alcohol / Substance Use: Not Applicable Psych Involvement: No (comment)  Admission diagnosis:  Hip pain [M25.559] AKI (acute kidney injury) ([place]) [N17.9] Non-traumatic rhabdomyolysis [M62.82] Patient Active Problem  List   Diagnosis Date Noted  . AKI (acute kidney injury) ([place]) 02/15/2019  . Rhabdomyolysis 02/15/2019  . Atrial fibrillation ([place]) 04/15/2018  . Long term (current) use of anticoagulants 04/15/2018  . Colon polyps 04/12/2017  . Nephropathy 11/10/2016  . Mobitz type 1 second degree atrioventricular block 02/07/2016  . Pre-ulcerative calluses 03/16/2015  . Right foot drop 03/16/2015  . CAD (coronary artery disease), native coronary artery 09/17/2014  . Eustachian tube dysfunction 10/28/2013  . Hearing loss 10/28/2013  . Obesity (BMI 30.0-34.9) 08/30/2012  . Chest wall pain 08/21/2012  . Drug rash 11/21/2011  . Dyslipidemia 07/26/2011  . Type 2 diabetes mellitus ([place]) 06/23/2011  . ACTINIC KERATOSIS 06/11/2008  . ERECTILE DYSFUNCTION 05/21/2008  . CORONARY ARTERY DISEASE, S/P PTCA 02/06/2008  . Essential hypertension 12/13/2006  . HEMORRHOIDS, NOS 12/13/2006  . BPH 12/13/2006  . OSTEOARTHRITIS, LOWER LEG 12/13/2006  . BACK PAIN, LOW 12/13/2006   PCP:  [name], FNP Pharmacy:   [facility] [address] [place]), [place] - [address] ([place]) [place] [address] Phone: [phone] Fax: [phone]     Social Determinants of Health (SDOH) Interventions    Readmission Risk Interventions No flowsheet data found.

## 2019-02-17 NOTE — Progress Notes (Signed)
Occupational Therapy Treatment Patient Details Name: Colin Newman MRN: [id number] DOB: 1933/04/20 Today's Date: 02/17/2019    History of present illness Pt is an 85 y.o. male admitted 02/15/19 after fall, found stuck in crawl space by family 26 hours later. Found to have mild AKI, likely rhabdomyolysis. PMH includes CAD, afib on Coumadin, HTN, DM.    OT comments  Patient progressing slowly.  Continues to be limited by soreness, impaired balance and generalized weakness.  Engaged in self care at sink with close supervision for balance, pt maintaining forward lean at sink during B UE bathing/grooming tasks.  Completes in room mobility with min guard to close supervision using RW, standing at commode for toileting.  Pt requires cueing during session for sequencing and safety awareness using RW.  Reports daughter plans to stay with him for a few days to provide 24/7 at discharge.  Will follow.    Follow Up Recommendations  Home health OT;Supervision/Assistance - 24 hour    Equipment Recommendations  3 in 1 bedside commode    Recommendations for Other Services      Precautions / Restrictions Precautions Precautions: Fall Restrictions Weight Bearing Restrictions: No       Mobility Bed Mobility Overal bed mobility: Needs Assistance Bed Mobility: Supine to Sit     Supine to sit: Mod assist     General bed mobility comments: ModA for UE support to assist trunk elevation; pt limited by soreness  Transfers Overall transfer level: Needs assistance Equipment used: Rolling walker (2 wheeled) Transfers: Sit to/from Stand Sit to Stand: Min guard         General transfer comment: min guard for safety and balance, good recall of hand placement     Balance Overall balance assessment: Needs assistance Sitting-balance support: No upper extremity supported;Feet supported Sitting balance-Leahy Scale: Fair     Standing balance support: Bilateral upper extremity supported;During  functional activity Standing balance-Leahy Scale: Poor Standing balance comment: Reliant on support (UE during mobility and leaning foward at hips when standing at sink)                           ADL either performed or assessed with clinical judgement   ADL Overall ADL's : Needs assistance/impaired     Grooming: Wash/dry hands;Standing;Wash/dry face;Supervision/safety Grooming Details (indicate cue type and reason): completed with sustained standing today,  Upper Body Bathing: Supervision/ safety;Standing     Lower Body Bathing Details (indicate cue type and reason): supervision standing to bathe peri area, discussed safety sitting in shower for LB bathing  Upper Body Dressing : Minimal assistance;Standing Upper Body Dressing Details (indicate cue type and reason): donning gown      Toilet Transfer: Min guard;Ambulation;RW Toilet Transfer Details (indicate cue type and reason): simulated to recliner, cueing for safety with RW  Toileting- Clothing Manipulation and Hygiene: Supervision/safety;Sit to/from stand Toileting - Clothing Manipulation Details (indicate cue type and reason): standing at commode with supervision      Functional mobility during ADLs: Min guard;Supervision/safety;Rolling walker General ADL Comments: pt requires intermittent cueing for safety awareness, reliant on support of hips at sink standing with 0 hand support     Vision   Vision Assessment?: No apparent visual deficits   Perception     Praxis      Cognition Arousal/Alertness: Awake/alert Behavior During Therapy: WFL for tasks assessed/performed Overall Cognitive Status: Impaired/Different from baseline Area of Impairment: Safety/judgement;Awareness;Problem solving  Safety/Judgement: Decreased awareness of safety;Decreased awareness of deficits Awareness: Emergent Problem Solving: Slow processing;Difficulty sequencing;Requires verbal cues General  Comments: pt requires increased verbal cueing to sequence through tasks, 2 errors with sequencing "months backwards", and cueing for safety awareness         Exercises Exercises: Other exercises Other Exercises Other Exercises: EOB seated UE AROM (overhead and FF)   Shoulder Instructions       General Comments      Pertinent Vitals/ Pain       Pain Assessment: Faces Faces Pain Scale: Hurts a little bit Pain Location: Generalized Pain Descriptors / Indicators: Sore Pain Intervention(s): Monitored during session;Repositioned  Home Living                                          Prior Functioning/Environment              Frequency  Min 2X/week        Progress Toward Goals  OT Goals(current goals can now be found in the care plan section)  Progress towards OT goals: Progressing toward goals  Acute Rehab OT Goals Patient Stated Goal: to get home  OT Goal Formulation: With patient Time For Goal Achievement: 03/02/19 Potential to Achieve Goals: Good  Plan Discharge plan remains appropriate;Frequency remains appropriate    Co-evaluation                 AM-PAC OT "6 Clicks" Daily Activity     Outcome Measure   Help from another person eating meals?: None Help from another person taking care of personal grooming?: None Help from another person toileting, which includes using toliet, bedpan, or urinal?: A Little Help from another person bathing (including washing, rinsing, drying)?: A Little Help from another person to put on and taking off regular upper body clothing?: A Little Help from another person to put on and taking off regular lower body clothing?: A Little 6 Click Score: 20    End of Session Equipment Utilized During Treatment: Rolling walker  OT Visit Diagnosis: Other abnormalities of gait and mobility (R26.89);Muscle weakness (generalized) (M62.81);History of falling (Z91.81)   Activity Tolerance Patient tolerated treatment  well   Patient Left in chair;with call bell/phone within reach;with chair alarm set   Nurse Communication Mobility status        Time: 1423-9532 OT Time Calculation (min): 21 min  Charges: OT General Charges $OT Visit: 1 Visit OT Treatments $Self Care/Home Management : 8-22 mins  Delight Stare, OT Acute Rehabilitation Services Pager [phone] Office [place] 02/17/2019, 4:42 PM

## 2019-02-17 NOTE — Progress Notes (Signed)
PROGRESS NOTE    Colin Newman  WPY:[id number] DOB: December 07, 1932 DOA: 02/15/2019 PCP: [name], FNP    Brief Narrative:  85 year old gentleman with history of coronary artery disease, remote LAD stenting 2005, paroxysmal A. fib on Coumadin, hypertension, hyperlipidemia and diabetes admitted with rhabdomyolysis.  Patient lives independent.  He went to his crawlspace to fix something, he got stuck for 26 hours in the crawl space and found by family member.  Reported muscle stiffness.  Found to have acute kidney injury, leukocytosis, hyperkalemia and elevated CK of 8500.  Treated with IV fluids with clinical improvement.   Assessment & Plan:   Principal Problem:   AKI (acute kidney injury) ([place]) Active Problems:   BACK PAIN, LOW   Type 2 diabetes mellitus (HCC)   CAD (coronary artery disease), native coronary artery   Atrial fibrillation (HCC)   Rhabdomyolysis  Acute kidney injury with traumatic rhabdomyolysis and hyperkalemia: Presented with creatinine 1.9, treated with IV fluids with improving levels.  We will continue to monitor.  Adequate urine output. CK 8500-8800-3600.  Since patient has improving renal functions, will continue maintenance IV fluids today.  Potassium is normalizing.  History of coronary artery disease: Fairly stable.  Without chest pain.  Continue beta-blockers.  Holding statin.  Chronic atrial fibrillation and flutter: Heart rate is controlled.  Therapeutic on Coumadin.  Leukocytosis:  With no evidence of acute infection. Resolved.   Type 2 diabetes on metformin:  Holding metformin.  Keep on sliding scale insulin.  Sacral Skin tear:  no pressure ulcer.  Patient continues to need hospitalization for monitoring and IV fluid administration as well as close monitoring of his laboratory parameters.     DVT prophylaxis: Coumadin Code Status: DNR Family Communication: No family at bedside.  Patient to communicate with family Disposition Plan:  Anticipate discharge home with adequate clinical improvement.  Anticipate tomorrow.   Consultants:   None  Procedures:   None  Antimicrobials:   None   Subjective: Patient was seen and examined.  Denied any other overnight events.  Less soreness today.  Objective: Vitals:   02/16/19 0529 02/16/19 1300 02/16/19 2137 02/17/19 0627  BP: (!) 152/70 108/63 (!) 122/55 130/61  Pulse: 70 65 68 64  Resp: 18 16 16 18   Temp: 98.1 F (36.7 C) 98.3 F (36.8 C) 98.2 F (36.8 C) 98.2 F (36.8 C)  TempSrc:  Oral Oral   SpO2: 99% 99% 97% 97%  Weight:      Height:        Intake/Output Summary (Last 24 hours) at 02/17/2019 1138 Last data filed at 02/17/2019 0954 Gross per 24 hour  Intake 120 ml  Output 450 ml  Net -330 ml   Filed Weights   02/15/19 1418 02/15/19 2100  Weight: 108.9 kg 108.9 kg    Examination:  General exam: Appears calm and comfortable  Respiratory system: Clear to auscultation. Respiratory effort normal. Cardiovascular system: S1 & S2 heard, irregularly irregular. No JVD, murmurs, rubs, gallops or clicks. No pedal edema. Gastrointestinal system: Abdomen is nondistended, soft and nontender. No organomegaly or masses felt. Normal bowel sounds heard. Central nervous system: Alert and oriented. No focal neurological deficits. Extremities: Symmetric 5 x 5 power. Skin: Patient has some abrasions on his bilateral legs. Skin tear at buttock . Psychiatry: Judgement and insight appear normal. Mood & affect appropriate.     Data Reviewed: I have personally reviewed following labs and imaging studies  CBC: Recent Labs  Lab 02/15/19 1442 02/16/19 0320 02/17/19  0323  WBC 20.7* 15.1* 8.1  NEUTROABS 16.8*  --  6.0  HGB 11.3* 10.1* 9.6*  HCT 35.4* 30.9* 29.3*  MCV 102.0* 99.0 100.0  PLT 240 205 440   Basic Metabolic Panel: Recent Labs  Lab 02/15/19 1442 02/16/19 0320 02/17/19 0323  NA 139 138 141  K 5.3* 5.4* 4.1  CL 104 105 112*  CO2 16* 21* 21*    GLUCOSE 171* 146* 162*  BUN 44* 47* 41*  CREATININE 1.91* 1.55* 1.20  CALCIUM 8.5* 8.2* 7.6*   GFR: Estimated Creatinine Clearance: 58.5 mL/min (by C-G formula based on SCr of 1.2 mg/dL). Liver Function Tests: Recent Labs  Lab 02/15/19 1442 02/16/19 0320  AST 161* 178*  ALT 54* 57*  ALKPHOS 105 89  BILITOT 1.8* 1.7*  PROT 6.5 5.6*  ALBUMIN 3.4* 2.8*   No results for input(s): LIPASE, AMYLASE in the last 168 hours. No results for input(s): AMMONIA in the last 168 hours. Coagulation Profile: Recent Labs  Lab 02/15/19 1442 02/16/19 0320 02/17/19 0323  INR 2.4* 2.8* 2.7*   Cardiac Enzymes: Recent Labs  Lab 02/15/19 1442 02/16/19 0320 02/17/19 0323  CKTOTAL 8,576* 8,880* 3,623*   BNP (last 3 results) No results for input(s): PROBNP in the last 8760 hours. HbA1C: No results for input(s): HGBA1C in the last 72 hours. CBG: Recent Labs  Lab 02/16/19 0803 02/16/19 1222 02/16/19 1652 02/16/19 2138 02/17/19 0752  GLUCAP 216* 121* 228* 158* 137*   Lipid Profile: No results for input(s): CHOL, HDL, LDLCALC, TRIG, CHOLHDL, LDLDIRECT in the last 72 hours. Thyroid Function Tests: No results for input(s): TSH, T4TOTAL, FREET4, T3FREE, THYROIDAB in the last 72 hours. Anemia Panel: No results for input(s): VITAMINB12, FOLATE, FERRITIN, TIBC, IRON, RETICCTPCT in the last 72 hours. Sepsis Labs: No results for input(s): PROCALCITON, LATICACIDVEN in the last 168 hours.  Recent Results (from the past 240 hour(s))  SARS Coronavirus 2 (CEPHEID - Performed in [place] hospital lab), Hosp Order     Status: None   Collection Time: 02/15/19  5:18 PM  Result Value Ref Range Status   SARS Coronavirus 2 NEGATIVE NEGATIVE Final    Comment: (NOTE) If result is NEGATIVE SARS-CoV-2 target nucleic acids are NOT DETECTED. The SARS-CoV-2 RNA is generally detectable in upper and lower  respiratory specimens during the acute phase of infection. The lowest  concentration of SARS-CoV-2  viral copies this assay can detect is 250  copies / mL. A negative result does not preclude SARS-CoV-2 infection  and should not be used as the sole basis for treatment or other  patient management decisions.  A negative result may occur with  improper specimen collection / handling, submission of specimen other  than nasopharyngeal swab, presence of viral mutation(s) within the  areas targeted by this assay, and inadequate number of viral copies  (<250 copies / mL). A negative result must be combined with clinical  observations, patient history, and epidemiological information. If result is POSITIVE SARS-CoV-2 target nucleic acids are DETECTED. The SARS-CoV-2 RNA is generally detectable in upper and lower  respiratory specimens dur ing the acute phase of infection.  Positive  results are indicative of active infection with SARS-CoV-2.  Clinical  correlation with patient history and other diagnostic information is  necessary to determine patient infection status.  Positive results do  not rule out bacterial infection or co-infection with other viruses. If result is PRESUMPTIVE POSTIVE SARS-CoV-2 nucleic acids MAY BE PRESENT.   A presumptive positive result was obtained on  the submitted specimen  and confirmed on repeat testing.  While 2019 novel coronavirus  (SARS-CoV-2) nucleic acids may be present in the submitted sample  additional confirmatory testing may be necessary for epidemiological  and / or clinical management purposes  to differentiate between  SARS-CoV-2 and other Sarbecovirus currently known to infect humans.  If clinically indicated additional testing with an alternate test  methodology [phone]) is advised. The SARS-CoV-2 RNA is generally  detectable in upper and lower respiratory sp ecimens during the acute  phase of infection. The expected result is Negative. Fact Sheet for Patients:  StrictlyIdeas.no Fact Sheet for Healthcare  Providers: BankingDealers.co.za This test is not yet approved or cleared by the [place] FDA and has been authorized for detection and/or diagnosis of SARS-CoV-2 by FDA under an Emergency Use Authorization (EUA).  This EUA will remain in effect (meaning this test can be used) for the duration of the COVID-19 declaration under Section 564(b)(1) of the Act, 21 U.S.C. section 360bbb-3(b)(1), unless the authorization is terminated or revoked sooner. Performed at [facility], [place] [address], [place], [place] [address]          Radiology Studies: Ct Head Wo Contrast  Result Date: 02/15/2019 CLINICAL DATA:  Fall. EXAM: CT HEAD WITHOUT CONTRAST CT CERVICAL SPINE WITHOUT CONTRAST TECHNIQUE: Multidetector CT imaging of the head and cervical spine was performed following the standard protocol without intravenous contrast. Multiplanar CT image reconstructions of the cervical spine were also generated. COMPARISON:  None. FINDINGS: CT HEAD FINDINGS Brain: No evidence of acute infarction, hemorrhage, hydrocephalus, extra-axial collection or mass lesion/mass effect. Mild chronic small vessel ischemic change in the periventricular white matter. Vascular: Atherosclerotic calcification. Skull: No acute or aggressive finding. Sinuses/Orbits: Bilateral cataract resection. CT CERVICAL SPINE FINDINGS Alignment: Straightening of the cervical spine Skull base and vertebrae: Negative for acute fracture Soft tissues and spinal canal: No prevertebral fluid or swelling. No visible canal hematoma. Disc levels: Extensive spondylosis with multi-level ankylosis. There is generalized degenerative facet spurring. Notable advanced biforaminal impingement at C3-4 and C4-5. Upper chest: Negative IMPRESSION: 1. No acute intracranial finding. 2. Negative for cervical spine fracture. 3. Advanced spondylosis with multi-level ankylosis. Electronically Signed   By: Monte Fantasia M.D.   On: 02/15/2019  15:19   Ct Cervical Spine Wo Contrast  Result Date: 02/15/2019 CLINICAL DATA:  Fall. EXAM: CT HEAD WITHOUT CONTRAST CT CERVICAL SPINE WITHOUT CONTRAST TECHNIQUE: Multidetector CT imaging of the head and cervical spine was performed following the standard protocol without intravenous contrast. Multiplanar CT image reconstructions of the cervical spine were also generated. COMPARISON:  None. FINDINGS: CT HEAD FINDINGS Brain: No evidence of acute infarction, hemorrhage, hydrocephalus, extra-axial collection or mass lesion/mass effect. Mild chronic small vessel ischemic change in the periventricular white matter. Vascular: Atherosclerotic calcification. Skull: No acute or aggressive finding. Sinuses/Orbits: Bilateral cataract resection. CT CERVICAL SPINE FINDINGS Alignment: Straightening of the cervical spine Skull base and vertebrae: Negative for acute fracture Soft tissues and spinal canal: No prevertebral fluid or swelling. No visible canal hematoma. Disc levels: Extensive spondylosis with multi-level ankylosis. There is generalized degenerative facet spurring. Notable advanced biforaminal impingement at C3-4 and C4-5. Upper chest: Negative IMPRESSION: 1. No acute intracranial finding. 2. Negative for cervical spine fracture. 3. Advanced spondylosis with multi-level ankylosis. Electronically Signed   By: Monte Fantasia M.D.   On: 02/15/2019 15:19   Dg Hips Bilat With Pelvis 2v  Result Date: 02/15/2019 CLINICAL DATA:  Bilateral hip pain EXAM: DG HIP (WITH OR WITHOUT  PELVIS) 2V BILAT COMPARISON:  None. FINDINGS: Generalized osteopenia. No acute fracture or dislocation. Mild osteoarthritis of bilateral hips. Lower lumbar spine spondylosis. Osteoarthritis of bilateral SI joints. Peripheral vascular atherosclerotic disease. IMPRESSION: No acute osseous injury of the bilateral hips. Electronically Signed   By: Kathreen Devoid   On: 02/15/2019 18:57        Scheduled Meds:  amLODipine  5 mg Oral Daily    atorvastatin  40 mg Oral Daily   insulin aspart  0-9 Units Subcutaneous TID WC   metoprolol tartrate  50 mg Oral BID   warfarin  5 mg Oral ONCE-1800   Warfarin - Pharmacist Dosing Inpatient   Does not apply q1800   Continuous Infusions:    LOS: 1 day    Time spent: 25 minutes    Barb Merino, MD [facility] Pager [phone]  If 7PM-7AM, please contact night-coverage [email or web address] Password [email or web address] 02/17/2019, 11:38 AM

## 2019-02-18 LAB — CBC WITH DIFFERENTIAL/PLATELET
Abs Immature Granulocytes: 0.08 10*3/uL — ABNORMAL HIGH (ref 0.00–0.07)
Basophils Absolute: 0 10*3/uL (ref 0.0–0.1)
Basophils Relative: 1 %
Eosinophils Absolute: 0.3 10*3/uL (ref 0.0–0.5)
Eosinophils Relative: 4 %
HCT: 28 % — ABNORMAL LOW (ref 39.0–52.0)
Hemoglobin: 9.1 g/dL — ABNORMAL LOW (ref 13.0–17.0)
Immature Granulocytes: 1 %
Lymphocytes Relative: 16 %
Lymphs Abs: 1 10*3/uL (ref 0.7–4.0)
MCH: 33.2 pg (ref 26.0–34.0)
MCHC: 32.5 g/dL (ref 30.0–36.0)
MCV: 102.2 fL — ABNORMAL HIGH (ref 80.0–100.0)
Monocytes Absolute: 0.8 10*3/uL (ref 0.1–1.0)
Monocytes Relative: 13 %
Neutro Abs: 4 10*3/uL (ref 1.7–7.7)
Neutrophils Relative %: 65 %
Platelets: 156 10*3/uL (ref 150–400)
RBC: 2.74 MIL/uL — ABNORMAL LOW (ref 4.22–5.81)
RDW: 16 % — ABNORMAL HIGH (ref 11.5–15.5)
WBC: 6.1 10*3/uL (ref 4.0–10.5)
nRBC: 0.3 % — ABNORMAL HIGH (ref 0.0–0.2)

## 2019-02-18 LAB — CK: Total CK: 1380 U/L — ABNORMAL HIGH (ref 49–397)

## 2019-02-18 LAB — PHOSPHORUS: Phosphorus: 2.8 mg/dL (ref 2.5–4.6)

## 2019-02-18 LAB — BASIC METABOLIC PANEL
Anion gap: 6 (ref 5–15)
BUN: 27 mg/dL — ABNORMAL HIGH (ref 8–23)
CO2: 23 mmol/L (ref 22–32)
Calcium: 7.5 mg/dL — ABNORMAL LOW (ref 8.9–10.3)
Chloride: 112 mmol/L — ABNORMAL HIGH (ref 98–111)
Creatinine, Ser: 1.04 mg/dL (ref 0.61–1.24)
GFR calc Af Amer: >60 mL/min (ref >60)
GFR calc non Af Amer: >60 mL/min (ref >60)
Glucose, Bld: 154 mg/dL — ABNORMAL HIGH (ref 70–99)
Potassium: 4.3 mmol/L (ref 3.5–5.1)
Sodium: 141 mmol/L (ref 135–145)

## 2019-02-18 LAB — MAGNESIUM: Magnesium: 2.2 mg/dL (ref 1.7–2.4)

## 2019-02-18 LAB — GLUCOSE, CAPILLARY: Glucose-Capillary: 157 mg/dL — ABNORMAL HIGH (ref 70–99)

## 2019-02-18 LAB — PROTIME-INR
INR: 2.5 — ABNORMAL HIGH (ref 0.8–1.2)
Prothrombin Time: 26.5 seconds — ABNORMAL HIGH (ref 11.4–15.2)

## 2019-02-18 MED ORDER — WARFARIN SODIUM 5 MG PO TABS: 5.0000 mg | ORAL_TABLET | Freq: Once | ORAL | Status: DC

## 2019-02-18 NOTE — Progress Notes (Signed)
[place] for warfarin Indication: atrial fibrillation  Allergies  Allergen Reactions  . Lisinopril Hives, Itching and Cough  . Losartan Hives and Other (See Comments)    Hives, itching, swelling of face and tongue associated w/ taking this medication.  [name] Hctz [Hydrochlorothiazide] Itching    Patient Measurements: Height: 5\' 11"  (180.3 cm) Weight: 240 lb (108.9 kg) IBW/kg (Calculated) : 75.3  Vital Signs: Temp: 97.9 F (36.6 C) (05/05 0518) Temp Source: Oral (05/05 0518) BP: 137/54 (05/05 0518) Pulse Rate: 68 (05/05 0518)  Labs: Recent Labs    02/16/19 0320 02/17/19 0323 02/18/19 0303  HGB 10.1* 9.6* 9.1*  HCT 30.9* 29.3* 28.0*  PLT 205 176 156  LABPROT 29.3* 28.5* 26.5*  INR 2.8* 2.7* 2.5*  CREATININE 1.55* 1.20 1.04  CKTOTAL 8,880* 3,623* 1,380*    Estimated Creatinine Clearance: 67.5 mL/min (by C-G formula based on SCr of 1.04 mg/dL).    Assessment: 54 yom presented to the ED after being found down for a prolonged period after a minor fall. He is on chronic warfarin for history of afib. Warfarin to continue here. No bleeding noted, including negative head/spine CT.  INR remains therapeutic at 2.5. Hgb 9.1 Home regimen: 2.5mg  PO Sunday and 5mg  on all other days  Goal of Therapy:  INR 2-3 Monitor platelets by anticoagulation protocol: Yes   Plan:  Warfarin 5mg  PO x 1 tonight Daily INR, s/sx bleeding, CBC  Isla Sabree A. Levada Dy, PharmD, [place] Please utilize Amion for appropriate phone number to reach the unit pharmacist ([place])   02/18/2019 7:51 AM

## 2019-02-18 NOTE — Discharge Summary (Signed)
Physician Discharge Summary  Colin Newman HQI:[id number] DOB: 05-15-1933 DOA: 02/15/2019  PCP: [name], FNP  Admit date: 02/15/2019 Discharge date: 02/18/2019  Admitted From: home  Disposition:  Home   Recommendations for Outpatient Follow-up:  1. Follow up with PCP in 1-2 weeks   Home Health:yes   Equipment/Devices:RW   Discharge Condition:stable   CODE STATUS:DNR  Diet recommendation: Cardiac diet.  Brief/Interim Summary: 86 year old gentleman with history of coronary artery disease, remote LAD stenting 2005, paroxysmal A. fib on Coumadin, hypertension, hyperlipidemia and diabetes admitted with rhabdomyolysis.  Patient lives independent.  He went to his crawlspace to fix something, he got stuck for 26 hours in the crawl space and found by family member.  Reported muscle stiffness.  Found to have acute kidney injury, leukocytosis, hyperkalemia and elevated CK of 8500.  Treated with IV fluids with clinical improvement.  Discharge Diagnoses:  Principal Problem:   AKI (acute kidney injury) ([place]) Active Problems:   BACK PAIN, LOW   Type 2 diabetes mellitus (HCC)   CAD (coronary artery disease), native coronary artery   Atrial fibrillation (HCC)   Rhabdomyolysis  For patient's acute kidney injury with traumatic rhabdomyolysis and hyperkalemia, he was aggressively treated with IV fluids.  His CK level improved from [id number] today.  Renal functions normalized.  Potassium normalized.  Leukocytosis improved.  Patient with adequate clinical improvement.  Resumed all home medications.  Resumed a statin as this was not related to statin.  He was seen by therapies in the hospital.  Recommended rolling walker and fall precautions all time.  He is going home with his daughter.  Suggested all-time full fall precautions.  Discharge Instructions  Discharge Instructions    Diet - low sodium heart healthy   Complete by:  As directed    Discharge instructions   Complete  by:  As directed    Drink plenty of water   Increase activity slowly   Complete by:  As directed      Allergies as of 02/18/2019      Reactions   Lisinopril Hives, Itching, Cough   Losartan Hives, Other (See Comments)   Hives, itching, swelling of face and tongue associated w/ taking this medication.   Hctz [hydrochlorothiazide] Itching      Medication List    TAKE these medications   acetaminophen 325 MG tablet Commonly known as:  TYLENOL Take 650 mg by mouth every 6 (six) hours as needed for mild pain.   amLODipine 5 MG tablet Commonly known as:  NORVASC Take 1 tablet (5 mg total) by mouth daily.   atorvastatin 40 MG tablet Commonly known as:  LIPITOR Take 1 tablet (40 mg total) by mouth daily.   B-12 Compliance Injection 1000 MCG/ML Kit Generic drug:  Cyanocobalamin Inject as directed every 30 (thirty) days.   Fish Oil 1200 MG Caps Take 1,200 mg by mouth daily.   furosemide 20 MG tablet Commonly known as:  LASIX Take 1 tablet (20 mg total) by mouth daily. What changed:    when to take this  reasons to take this   metFORMIN 1000 MG tablet Commonly known as:  GLUCOPHAGE Take 1 tablet (1,000 mg total) by mouth 2 (two) times daily.   metoprolol tartrate 50 MG tablet Commonly known as:  LOPRESSOR Take 1 tablet (50 mg total) by mouth 2 (two) times daily.   MULTI-VITAMIN DAILY PO Take 1 tablet by mouth daily.   nitroGLYCERIN 0.4 MG SL tablet Commonly known as:  NITROSTAT DISSOLVE ONE  TABLET UNDER THE TONGUE EVERY 5 MINUTES AS NEEDED FOR CHEST PAIN.  DO NOT EXCEED A TOTAL OF 3 DOSES IN 15 MINUTES What changed:  See the new instructions.   warfarin 5 MG tablet Commonly known as:  COUMADIN Take as directed. If you are unsure how to take this medication, talk to your nurse or doctor. Original instructions:  TAKE 1 TABLET BY MOUTH ONCE DAILY OR AS DIRECTED BY COUMADIN CLINIC What changed:    how much to take  how to take this  when to take  this  additional instructions            Durable Medical Equipment  (From admission, onward)         Start     Ordered   02/17/19 1503  For home use only DME Walker rolling  Once    Question:  Patient needs a walker to treat with the following condition  Answer:  Weakness   02/17/19 1504   02/17/19 1503  For home use only DME 3 n 1  Once     02/17/19 1504         Follow-up Information    [name], FNP Follow up in 2 week(s).   Specialty:  Internal Medicine Contact information: [address]  [place] [address] [phone]          Allergies  Allergen Reactions  . Lisinopril Hives, Itching and Cough  . Losartan Hives and Other (See Comments)    Hives, itching, swelling of face and tongue associated w/ taking this medication.  [name] Hctz [Hydrochlorothiazide] Itching    Consultations:  None   Procedures/Studies: Ct Head Wo Contrast  Result Date: 02/15/2019 CLINICAL DATA:  Fall. EXAM: CT HEAD WITHOUT CONTRAST CT CERVICAL SPINE WITHOUT CONTRAST TECHNIQUE: Multidetector CT imaging of the head and cervical spine was performed following the standard protocol without intravenous contrast. Multiplanar CT image reconstructions of the cervical spine were also generated. COMPARISON:  None. FINDINGS: CT HEAD FINDINGS Brain: No evidence of acute infarction, hemorrhage, hydrocephalus, extra-axial collection or mass lesion/mass effect. Mild chronic small vessel ischemic change in the periventricular white matter. Vascular: Atherosclerotic calcification. Skull: No acute or aggressive finding. Sinuses/Orbits: Bilateral cataract resection. CT CERVICAL SPINE FINDINGS Alignment: Straightening of the cervical spine Skull base and vertebrae: Negative for acute fracture Soft tissues and spinal canal: No prevertebral fluid or swelling. No visible canal hematoma. Disc levels: Extensive spondylosis with multi-level ankylosis. There is generalized degenerative facet spurring.  Notable advanced biforaminal impingement at C3-4 and C4-5. Upper chest: Negative IMPRESSION: 1. No acute intracranial finding. 2. Negative for cervical spine fracture. 3. Advanced spondylosis with multi-level ankylosis. Electronically Signed   By: Monte Fantasia M.D.   On: 02/15/2019 15:19   Ct Cervical Spine Wo Contrast  Result Date: 02/15/2019 CLINICAL DATA:  Fall. EXAM: CT HEAD WITHOUT CONTRAST CT CERVICAL SPINE WITHOUT CONTRAST TECHNIQUE: Multidetector CT imaging of the head and cervical spine was performed following the standard protocol without intravenous contrast. Multiplanar CT image reconstructions of the cervical spine were also generated. COMPARISON:  None. FINDINGS: CT HEAD FINDINGS Brain: No evidence of acute infarction, hemorrhage, hydrocephalus, extra-axial collection or mass lesion/mass effect. Mild chronic small vessel ischemic change in the periventricular white matter. Vascular: Atherosclerotic calcification. Skull: No acute or aggressive finding. Sinuses/Orbits: Bilateral cataract resection. CT CERVICAL SPINE FINDINGS Alignment: Straightening of the cervical spine Skull base and vertebrae: Negative for acute fracture Soft tissues and spinal canal: No prevertebral fluid or swelling. No visible  canal hematoma. Disc levels: Extensive spondylosis with multi-level ankylosis. There is generalized degenerative facet spurring. Notable advanced biforaminal impingement at C3-4 and C4-5. Upper chest: Negative IMPRESSION: 1. No acute intracranial finding. 2. Negative for cervical spine fracture. 3. Advanced spondylosis with multi-level ankylosis. Electronically Signed   By: Monte Fantasia M.D.   On: 02/15/2019 15:19   Dg Hips Bilat With Pelvis 2v  Result Date: 02/15/2019 CLINICAL DATA:  Bilateral hip pain EXAM: DG HIP (WITH OR WITHOUT PELVIS) 2V BILAT COMPARISON:  None. FINDINGS: Generalized osteopenia. No acute fracture or dislocation. Mild osteoarthritis of bilateral hips. Lower lumbar spine  spondylosis. Osteoarthritis of bilateral SI joints. Peripheral vascular atherosclerotic disease. IMPRESSION: No acute osseous injury of the bilateral hips. Electronically Signed   By: Kathreen Devoid   On: 02/15/2019 18:57       Subjective: Patient was seen and examined.  Denied any complaints.  His soreness has improved. Eager to go home with his daughter.  No other overnight events. Walked in the hallway and needed support of the walker.   Discharge Exam: Vitals:   02/17/19 2208 02/18/19 0518  BP: 127/60 (!) 137/54  Pulse: 67 68  Resp:    Temp: 98.2 F (36.8 C) 97.9 F (36.6 C)  SpO2: 98% 96%   Vitals:   02/16/19 2137 02/17/19 0627 02/17/19 2208 02/18/19 0518  BP: (!) 122/55 130/61 127/60 (!) 137/54  Pulse: 68 64 67 68  Resp: 16 18    Temp: 98.2 F (36.8 C) 98.2 F (36.8 C) 98.2 F (36.8 C) 97.9 F (36.6 C)  TempSrc: Oral  Oral Oral  SpO2: 97% 97% 98% 96%  Weight:      Height:        General: Pt is alert, awake, not in acute distress, on room air. Cardiovascular: RRR, S1/S2 +, no rubs, no gallops Respiratory: CTA bilaterally, no wheezing, no rhonchi Abdominal: Soft, NT, ND, bowel sounds + Extremities: no edema, no cyanosis Patient has some bruises on his legs as well as a skin tear on his buttock that is treated with dry dressing and is non-complicated.    The results of significant diagnostics from this hospitalization (including imaging, microbiology, ancillary and laboratory) are listed below for reference.     Microbiology: Recent Results (from the past 240 hour(s))  SARS Coronavirus 2 (CEPHEID - Performed in [place] hospital lab), Hosp Order     Status: None   Collection Time: 02/15/19  5:18 PM  Result Value Ref Range Status   SARS Coronavirus 2 NEGATIVE NEGATIVE Final    Comment: (NOTE) If result is NEGATIVE SARS-CoV-2 target nucleic acids are NOT DETECTED. The SARS-CoV-2 RNA is generally detectable in upper and lower  respiratory specimens during  the acute phase of infection. The lowest  concentration of SARS-CoV-2 viral copies this assay can detect is 250  copies / mL. A negative result does not preclude SARS-CoV-2 infection  and should not be used as the sole basis for treatment or other  patient management decisions.  A negative result may occur with  improper specimen collection / handling, submission of specimen other  than nasopharyngeal swab, presence of viral mutation(s) within the  areas targeted by this assay, and inadequate number of viral copies  (<250 copies / mL). A negative result must be combined with clinical  observations, patient history, and epidemiological information. If result is POSITIVE SARS-CoV-2 target nucleic acids are DETECTED. The SARS-CoV-2 RNA is generally detectable in upper and lower  respiratory specimens dur ing  the acute phase of infection.  Positive  results are indicative of active infection with SARS-CoV-2.  Clinical  correlation with patient history and other diagnostic information is  necessary to determine patient infection status.  Positive results do  not rule out bacterial infection or co-infection with other viruses. If result is PRESUMPTIVE POSTIVE SARS-CoV-2 nucleic acids MAY BE PRESENT.   A presumptive positive result was obtained on the submitted specimen  and confirmed on repeat testing.  While 2019 novel coronavirus  (SARS-CoV-2) nucleic acids may be present in the submitted sample  additional confirmatory testing may be necessary for epidemiological  and / or clinical management purposes  to differentiate between  SARS-CoV-2 and other Sarbecovirus currently known to infect humans.  If clinically indicated additional testing with an alternate test  methodology [phone]) is advised. The SARS-CoV-2 RNA is generally  detectable in upper and lower respiratory sp ecimens during the acute  phase of infection. The expected result is Negative. Fact Sheet for Patients:   StrictlyIdeas.no Fact Sheet for Healthcare Providers: BankingDealers.co.za This test is not yet approved or cleared by the [place] FDA and has been authorized for detection and/or diagnosis of SARS-CoV-2 by FDA under an Emergency Use Authorization (EUA).  This EUA will remain in effect (meaning this test can be used) for the duration of the COVID-19 declaration under Section 564(b)(1) of the Act, 21 U.S.C. section 360bbb-3(b)(1), unless the authorization is terminated or revoked sooner. Performed at [facility], [place] [address], [place], [place] [address]      Labs: BNP (last 3 results) No results for input(s): BNP in the last 8760 hours. Basic Metabolic Panel: Recent Labs  Lab 02/15/19 1442 02/16/19 0320 02/17/19 0323 02/18/19 0303  NA 139 138 141 141  K 5.3* 5.4* 4.1 4.3  CL 104 105 112* 112*  CO2 16* 21* 21* 23  GLUCOSE 171* 146* 162* 154*  BUN 44* 47* 41* 27*  CREATININE 1.91* 1.55* 1.20 1.04  CALCIUM 8.5* 8.2* 7.6* 7.5*  MG  --   --   --  2.2  PHOS  --   --   --  2.8   Liver Function Tests: Recent Labs  Lab 02/15/19 1442 02/16/19 0320  AST 161* 178*  ALT 54* 57*  ALKPHOS 105 89  BILITOT 1.8* 1.7*  PROT 6.5 5.6*  ALBUMIN 3.4* 2.8*   No results for input(s): LIPASE, AMYLASE in the last 168 hours. No results for input(s): AMMONIA in the last 168 hours. CBC: Recent Labs  Lab 02/15/19 1442 02/16/19 0320 02/17/19 0323 02/18/19 0303  WBC 20.7* 15.1* 8.1 6.1  NEUTROABS 16.8*  --  6.0 4.0  HGB 11.3* 10.1* 9.6* 9.1*  HCT 35.4* 30.9* 29.3* 28.0*  MCV 102.0* 99.0 100.0 102.2*  PLT 240 205 176 156   Cardiac Enzymes: Recent Labs  Lab 02/15/19 1442 02/16/19 0320 02/17/19 0323 02/18/19 0303  CKTOTAL 8,576* 8,880* 3,623* 1,380*   BNP: Invalid input(s): POCBNP CBG: Recent Labs  Lab 02/17/19 0752 02/17/19 1153 02/17/19 1657 02/17/19 2209 02/18/19 0754  GLUCAP 137* 189* 213* 126* 157*    D-Dimer No results for input(s): DDIMER in the last 72 hours. Hgb A1c No results for input(s): HGBA1C in the last 72 hours. Lipid Profile No results for input(s): CHOL, HDL, LDLCALC, TRIG, CHOLHDL, LDLDIRECT in the last 72 hours. Thyroid function studies No results for input(s): TSH, T4TOTAL, T3FREE, THYROIDAB in the last 72 hours.  Invalid input(s): FREET3 Anemia work up No results for input(s): VITAMINB12,  FOLATE, FERRITIN, TIBC, IRON, RETICCTPCT in the last 72 hours. Urinalysis No results found for: COLORURINE, APPEARANCEUR, [place], [place], [place], [place], [place], [place], PROTEINUR, UROBILINOGEN, NITRITE, LEUKOCYTESUR Sepsis Labs Invalid input(s): PROCALCITONIN,  WBC,  LACTICIDVEN Microbiology Recent Results (from the past 240 hour(s))  SARS Coronavirus 2 (CEPHEID - Performed in [place] hospital lab), Hosp Order     Status: None   Collection Time: 02/15/19  5:18 PM  Result Value Ref Range Status   SARS Coronavirus 2 NEGATIVE NEGATIVE Final    Comment: (NOTE) If result is NEGATIVE SARS-CoV-2 target nucleic acids are NOT DETECTED. The SARS-CoV-2 RNA is generally detectable in upper and lower  respiratory specimens during the acute phase of infection. The lowest  concentration of SARS-CoV-2 viral copies this assay can detect is 250  copies / mL. A negative result does not preclude SARS-CoV-2 infection  and should not be used as the sole basis for treatment or other  patient management decisions.  A negative result may occur with  improper specimen collection / handling, submission of specimen other  than nasopharyngeal swab, presence of viral mutation(s) within the  areas targeted by this assay, and inadequate number of viral copies  (<250 copies / mL). A negative result must be combined with clinical  observations, patient history, and epidemiological information. If result is POSITIVE SARS-CoV-2 target nucleic acids are DETECTED. The SARS-CoV-2 RNA is  generally detectable in upper and lower  respiratory specimens dur ing the acute phase of infection.  Positive  results are indicative of active infection with SARS-CoV-2.  Clinical  correlation with patient history and other diagnostic information is  necessary to determine patient infection status.  Positive results do  not rule out bacterial infection or co-infection with other viruses. If result is PRESUMPTIVE POSTIVE SARS-CoV-2 nucleic acids MAY BE PRESENT.   A presumptive positive result was obtained on the submitted specimen  and confirmed on repeat testing.  While 2019 novel coronavirus  (SARS-CoV-2) nucleic acids may be present in the submitted sample  additional confirmatory testing may be necessary for epidemiological  and / or clinical management purposes  to differentiate between  SARS-CoV-2 and other Sarbecovirus currently known to infect humans.  If clinically indicated additional testing with an alternate test  methodology [phone]) is advised. The SARS-CoV-2 RNA is generally  detectable in upper and lower respiratory sp ecimens during the acute  phase of infection. The expected result is Negative. Fact Sheet for Patients:  StrictlyIdeas.no Fact Sheet for Healthcare Providers: BankingDealers.co.za This test is not yet approved or cleared by the [place] FDA and has been authorized for detection and/or diagnosis of SARS-CoV-2 by FDA under an Emergency Use Authorization (EUA).  This EUA will remain in effect (meaning this test can be used) for the duration of the COVID-19 declaration under Section 564(b)(1) of the Act, 21 U.S.C. section 360bbb-3(b)(1), unless the authorization is terminated or revoked sooner. Performed at [facility], [place] [address] [place], [place] [address]      Time coordinating discharge: 32 minutes  SIGNED:   Barb Merino, MD  [facility] 02/18/2019, 11:19 AM Pager  [phone]  If 7PM-7AM, please contact night-coverage [email or web address] Password [email or web address]

## 2019-02-18 NOTE — TOC Transition Note (Signed)
Transition of Care [facility]) - CM/SW Discharge Note   Patient Details  Name: Colin Newman MRN: [id number] Date of Birth: 04-Jan-1933  Transition of Care [facility] CM/SW Contact:  Sharin Mons, RN Phone Number: 02/18/2019, 11:55 AM   Clinical Narrative:    Transition to home today. Family to provide transportation to home. Home health services [place] with 24-48 hrs. DME will be delivered to bedside prior to d/c.  Final next level of care: [place] Barriers to Discharge: Barriers Resolved   Patient Goals and CMS Choice Patient states their goals for this hospitalization and ongoing recovery are:: go home and take care of myself CMS Medicare.gov Compare Post Acute Care list provided to:: Patient Choice offered to / list presented to : Patient  Discharge Placement                       Discharge Plan and Services   Discharge Planning Services: CM Consult Post Acute Care Choice: Home Health          DME Arranged: 3-N-1 DME Agency: [employer] Date DME Agency Contacted: 02/17/19 Time DME Agency Contacted: 1400 Representative spoke with at DME Agency: [place]: OT [place]: [place] ([place]) Date [place]: 02/17/19 Time [place]: [place] Representative spoke with at [place]: [place] ([place]) Interventions     Readmission Risk Interventions No flowsheet data found.

## 2019-02-18 NOTE — Progress Notes (Signed)
Patient ambulating in the hallway, approximately 150 feet, using walker; very unsteady on his feet. Will continue to monitor.

## 2019-02-18 NOTE — Progress Notes (Addendum)
Patient was discharged home by MD order; discharged instructions review and give to patient with care notes; IV DIC; foam dressings applied BLE (generalized scabs - some were bleeding);  DME delivered in the patient's room; patient will be escorted to the car by nurse via wheelchair.

## 2019-02-19 ENCOUNTER — Telehealth: Payer: Self-pay | Admitting: Family

## 2019-02-19 DIAGNOSIS — S71001D Unspecified open wound, right hip, subsequent encounter: Secondary | ICD-10-CM | POA: Diagnosis not present

## 2019-02-19 DIAGNOSIS — S91002D Unspecified open wound, left ankle, subsequent encounter: Secondary | ICD-10-CM | POA: Diagnosis not present

## 2019-02-19 DIAGNOSIS — I10 Essential (primary) hypertension: Secondary | ICD-10-CM | POA: Diagnosis not present

## 2019-02-19 DIAGNOSIS — Z7901 Long term (current) use of anticoagulants: Secondary | ICD-10-CM | POA: Diagnosis not present

## 2019-02-19 DIAGNOSIS — I2510 Atherosclerotic heart disease of native coronary artery without angina pectoris: Secondary | ICD-10-CM | POA: Diagnosis not present

## 2019-02-19 DIAGNOSIS — Z7984 Long term (current) use of oral hypoglycemic drugs: Secondary | ICD-10-CM | POA: Diagnosis not present

## 2019-02-19 DIAGNOSIS — S31825D Open bite of left buttock, subsequent encounter: Secondary | ICD-10-CM | POA: Diagnosis not present

## 2019-02-19 DIAGNOSIS — S91101D Unspecified open wound of right great toe without damage to nail, subsequent encounter: Secondary | ICD-10-CM | POA: Diagnosis not present

## 2019-02-19 DIAGNOSIS — E119 Type 2 diabetes mellitus without complications: Secondary | ICD-10-CM | POA: Diagnosis not present

## 2019-02-19 NOTE — Telephone Encounter (Signed)
Spoke with Merry Proud today and verbal given.

## 2019-02-19 NOTE — Telephone Encounter (Signed)
Ok with me 

## 2019-02-19 NOTE — Telephone Encounter (Signed)
Copied from [place] [id number]. Topic: Quick Communication - See Telephone Encounter >> Feb 19, 2019  1:21 PM Robina Ade, Helene Kelp D wrote: CRM for notification. See Telephone encounter for: 02/19/19. Merry Proud with [facility] called and is requesting verbal order for patient for home health PT. The order is as follow: 1X a week for 4 weeks, 2X a week for 2 weeks and 1X a week for 2 weeks. He also would like a nursing consult to asset him with foot wound. His call back number is [phone].

## 2019-02-24 ENCOUNTER — Telehealth: Payer: Self-pay

## 2019-02-24 DIAGNOSIS — I2510 Atherosclerotic heart disease of native coronary artery without angina pectoris: Secondary | ICD-10-CM | POA: Diagnosis not present

## 2019-02-24 DIAGNOSIS — E119 Type 2 diabetes mellitus without complications: Secondary | ICD-10-CM | POA: Diagnosis not present

## 2019-02-24 DIAGNOSIS — S71001D Unspecified open wound, right hip, subsequent encounter: Secondary | ICD-10-CM | POA: Diagnosis not present

## 2019-02-24 DIAGNOSIS — I10 Essential (primary) hypertension: Secondary | ICD-10-CM | POA: Diagnosis not present

## 2019-02-24 DIAGNOSIS — S31825D Open bite of left buttock, subsequent encounter: Secondary | ICD-10-CM | POA: Diagnosis not present

## 2019-02-24 DIAGNOSIS — S91002D Unspecified open wound, left ankle, subsequent encounter: Secondary | ICD-10-CM | POA: Diagnosis not present

## 2019-02-24 NOTE — Telephone Encounter (Signed)
Left message for Sharyn Lull with verbal okay for order requested.

## 2019-02-24 NOTE — Telephone Encounter (Signed)
Okay to give orders as requested;  

## 2019-02-24 NOTE — Telephone Encounter (Signed)
If he wants in office visit, offer [place] or [employer] or Wednesday; I can do virtual otherwise.

## 2019-02-24 NOTE — Telephone Encounter (Signed)
Copied from [place] [phone]. Topic: General - Inquiry >> Feb 24, 2019  2:17 PM Virl Axe D wrote: Reason for CRM: Sharyn Lull with [facility] stated she went out to see pt today and he has wounds on left leg. She thinks he may have cellulitis. She was hoping patient could get in the office for an appt and wound care recommendations. Please advise. CB#[phone]

## 2019-02-25 ENCOUNTER — Encounter: Payer: Self-pay | Admitting: Internal Medicine

## 2019-02-25 ENCOUNTER — Ambulatory Visit (INDEPENDENT_AMBULATORY_CARE_PROVIDER_SITE_OTHER): Payer: Medicare Other | Admitting: Internal Medicine

## 2019-02-25 DIAGNOSIS — N179 Acute kidney failure, unspecified: Secondary | ICD-10-CM | POA: Diagnosis not present

## 2019-02-25 DIAGNOSIS — E11622 Type 2 diabetes mellitus with other skin ulcer: Secondary | ICD-10-CM | POA: Diagnosis not present

## 2019-02-25 DIAGNOSIS — E119 Type 2 diabetes mellitus without complications: Secondary | ICD-10-CM | POA: Diagnosis not present

## 2019-02-25 DIAGNOSIS — S91002D Unspecified open wound, left ankle, subsequent encounter: Secondary | ICD-10-CM | POA: Diagnosis not present

## 2019-02-25 DIAGNOSIS — S31825D Open bite of left buttock, subsequent encounter: Secondary | ICD-10-CM | POA: Diagnosis not present

## 2019-02-25 DIAGNOSIS — I2510 Atherosclerotic heart disease of native coronary artery without angina pectoris: Secondary | ICD-10-CM | POA: Diagnosis not present

## 2019-02-25 DIAGNOSIS — S71001D Unspecified open wound, right hip, subsequent encounter: Secondary | ICD-10-CM | POA: Diagnosis not present

## 2019-02-25 DIAGNOSIS — T796XXD Traumatic ischemia of muscle, subsequent encounter: Secondary | ICD-10-CM

## 2019-02-25 DIAGNOSIS — I10 Essential (primary) hypertension: Secondary | ICD-10-CM | POA: Diagnosis not present

## 2019-02-25 DIAGNOSIS — L97929 Non-pressure chronic ulcer of unspecified part of left lower leg with unspecified severity: Secondary | ICD-10-CM | POA: Diagnosis not present

## 2019-02-25 MED ORDER — SILVER SULFADIAZINE 1 % EX CREA: 1.0000 "application " | TOPICAL_CREAM | Freq: Every day | CUTANEOUS | 0 refills | Status: AC

## 2019-02-25 NOTE — Telephone Encounter (Signed)
Spoke with Sharyn Lull and info given.  She stated they could only go out one time a week and caregiver would need to do the additional changes.  Asked her also to send over pictures as requested as well.

## 2019-02-25 NOTE — Telephone Encounter (Signed)
After visit with patient today have called in silvadene cream for him. Can you call home health and give verbal orders for nursing to change bandages and apply silvadene with changes 2-3 times per week? Also patient stated that they took pictures of the wounds can you see if they can send those to [place] so we can adjust treatment if needed?
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
## 2019-02-25 NOTE — Assessment & Plan Note (Signed)
Appears to be resolving. He has no muscle pain and CK levels were down dramatically during hospital stay. Eating and drinking well.

## 2019-02-25 NOTE — Assessment & Plan Note (Signed)
Appears to have resolved by the time he left hospital. Given he is high risk for [id number] complications will defer labs at this time and only do if change in clinical condition. He is eating and drinking well and no muscle pains currently.

## 2019-02-25 NOTE — Progress Notes (Signed)
Virtual Visit via Video Note  I connected with Colin Newman on 02/25/19 at 11:00 AM EDT by a video enabled telemedicine application and verified that I am speaking with the correct person using two identifiers.  The patient and the provider were at separate locations throughout the entire encounter.   I discussed the limitations of evaluation and management by telemedicine and the availability of in person appointments. The patient expressed understanding and agreed to proceed.  History of Present Illness: The patient is a 85 y.o. man with visit for hospital follow up (was stuck in a crawlspace for 26 hours and then family found him and helped him get out, he was given IV fluids for rhabdo and AKI which resolved well, denies pain in muscles now, did injure the tops of his feet while getting out of crawlspace). He admits to wounds about the size of a nickel on the tops of his feet by the toes, denies red rash on the foot or going up the leg. States home health was out to the house yesterday and took pictures and wrapped it up. He cannot remove for the video visit. He denies pain or itching in the feet or legs. Denies muscle cramps currently. Denies headaches or chest pains. Started on 02/15/19. Has not been taking any medications other than prescribed. Denies fevers or chills or SOB. Denies abdominal pain or diarrhea/constipation or blood in stool. Overall it is stable. Has tried nothing.   PMH, [place], social history reviewed and updated  Observations/Objective: Appearance: normal, breathing appears normal, casual grooming, abdomen does not appear distended, throat normal, cannot show wounds as they are wrapped and he cannot get that removed, memory normal, mental status is A and O times 3  Assessment and Plan: See problem oriented charting  Follow Up Instructions: rx for silvadene cream to use on wounds and call home health company to see if we can obtain those pictures of the wounds as well as have  them change bandages 2-3 times per week, talked to patient about red flag signs to call back or seek care immediately  I discussed the assessment and treatment plan with the patient. The patient was provided an opportunity to ask questions and all were answered. The patient agreed with the plan and demonstrated an understanding of the instructions.   The patient was advised to call back or seek an in-person evaluation if the symptoms worsen or if the condition fails to improve as anticipated.  Hoyt Koch, MD

## 2019-02-26 ENCOUNTER — Other Ambulatory Visit: Payer: Self-pay | Admitting: Cardiology

## 2019-02-26 DIAGNOSIS — I2510 Atherosclerotic heart disease of native coronary artery without angina pectoris: Secondary | ICD-10-CM | POA: Diagnosis not present

## 2019-02-26 DIAGNOSIS — E119 Type 2 diabetes mellitus without complications: Secondary | ICD-10-CM | POA: Diagnosis not present

## 2019-02-26 DIAGNOSIS — I10 Essential (primary) hypertension: Secondary | ICD-10-CM | POA: Diagnosis not present

## 2019-02-26 DIAGNOSIS — S71001D Unspecified open wound, right hip, subsequent encounter: Secondary | ICD-10-CM | POA: Diagnosis not present

## 2019-02-26 DIAGNOSIS — S91002D Unspecified open wound, left ankle, subsequent encounter: Secondary | ICD-10-CM | POA: Diagnosis not present

## 2019-02-26 DIAGNOSIS — S31825D Open bite of left buttock, subsequent encounter: Secondary | ICD-10-CM | POA: Diagnosis not present

## 2019-02-28 NOTE — Telephone Encounter (Signed)
Called and left message for Sharyn Lull this morning regarding the follow up on pics of patient's leg so Dr. Sharlet Salina could look it it.  Left fax number along with my direct office number for her to call me back if she needed to.

## 2019-03-04 DIAGNOSIS — S31825D Open bite of left buttock, subsequent encounter: Secondary | ICD-10-CM | POA: Diagnosis not present

## 2019-03-04 DIAGNOSIS — S71001D Unspecified open wound, right hip, subsequent encounter: Secondary | ICD-10-CM | POA: Diagnosis not present

## 2019-03-04 DIAGNOSIS — I10 Essential (primary) hypertension: Secondary | ICD-10-CM | POA: Diagnosis not present

## 2019-03-04 DIAGNOSIS — I2510 Atherosclerotic heart disease of native coronary artery without angina pectoris: Secondary | ICD-10-CM | POA: Diagnosis not present

## 2019-03-04 DIAGNOSIS — E119 Type 2 diabetes mellitus without complications: Secondary | ICD-10-CM | POA: Diagnosis not present

## 2019-03-04 DIAGNOSIS — S91002D Unspecified open wound, left ankle, subsequent encounter: Secondary | ICD-10-CM | POA: Diagnosis not present

## 2019-03-07 DIAGNOSIS — I2510 Atherosclerotic heart disease of native coronary artery without angina pectoris: Secondary | ICD-10-CM | POA: Diagnosis not present

## 2019-03-07 DIAGNOSIS — S71001D Unspecified open wound, right hip, subsequent encounter: Secondary | ICD-10-CM | POA: Diagnosis not present

## 2019-03-07 DIAGNOSIS — E119 Type 2 diabetes mellitus without complications: Secondary | ICD-10-CM | POA: Diagnosis not present

## 2019-03-07 DIAGNOSIS — S31825D Open bite of left buttock, subsequent encounter: Secondary | ICD-10-CM | POA: Diagnosis not present

## 2019-03-07 DIAGNOSIS — I10 Essential (primary) hypertension: Secondary | ICD-10-CM | POA: Diagnosis not present

## 2019-03-07 DIAGNOSIS — S91002D Unspecified open wound, left ankle, subsequent encounter: Secondary | ICD-10-CM | POA: Diagnosis not present

## 2019-03-11 ENCOUNTER — Ambulatory Visit (INDEPENDENT_AMBULATORY_CARE_PROVIDER_SITE_OTHER): Payer: Medicare Other

## 2019-03-11 ENCOUNTER — Encounter: Payer: Self-pay | Admitting: Family

## 2019-03-11 ENCOUNTER — Ambulatory Visit (INDEPENDENT_AMBULATORY_CARE_PROVIDER_SITE_OTHER): Payer: Medicare Other | Admitting: Family

## 2019-03-11 ENCOUNTER — Other Ambulatory Visit: Payer: Self-pay | Admitting: Family

## 2019-03-11 DIAGNOSIS — E1169 Type 2 diabetes mellitus with other specified complication: Secondary | ICD-10-CM

## 2019-03-11 DIAGNOSIS — E538 Deficiency of other specified B group vitamins: Secondary | ICD-10-CM

## 2019-03-11 DIAGNOSIS — S71001D Unspecified open wound, right hip, subsequent encounter: Secondary | ICD-10-CM | POA: Diagnosis not present

## 2019-03-11 DIAGNOSIS — I10 Essential (primary) hypertension: Secondary | ICD-10-CM | POA: Diagnosis not present

## 2019-03-11 DIAGNOSIS — S31825D Open bite of left buttock, subsequent encounter: Secondary | ICD-10-CM | POA: Diagnosis not present

## 2019-03-11 DIAGNOSIS — I2510 Atherosclerotic heart disease of native coronary artery without angina pectoris: Secondary | ICD-10-CM | POA: Diagnosis not present

## 2019-03-11 DIAGNOSIS — E119 Type 2 diabetes mellitus without complications: Secondary | ICD-10-CM | POA: Diagnosis not present

## 2019-03-11 DIAGNOSIS — S91002D Unspecified open wound, left ankle, subsequent encounter: Secondary | ICD-10-CM | POA: Diagnosis not present

## 2019-03-11 MED ORDER — CYANOCOBALAMIN 1000 MCG/ML IJ SOLN
1000.0000 ug | Freq: Once | INTRAMUSCULAR | Status: AC
  Administered 2019-03-11: 1000 ug via INTRAMUSCULAR

## 2019-03-11 NOTE — Progress Notes (Signed)
Agree with treatment 

## 2019-03-11 NOTE — Progress Notes (Signed)
Colin Newman is a 85 y.o. male with the following history as recorded in EpicCare:  Patient Active Problem List   Diagnosis Date Noted  . Diabetic ulcer of left lower leg ([place]) 02/25/2019  . AKI (acute kidney injury) ([place]) 02/15/2019  . Rhabdomyolysis 02/15/2019  . Atrial fibrillation ([place]) 04/15/2018  . Long term (current) use of anticoagulants 04/15/2018  . Colon polyps 04/12/2017  . Nephropathy 11/10/2016  . Mobitz type 1 second degree atrioventricular block 02/07/2016  . Pre-ulcerative calluses 03/16/2015  . Right foot drop 03/16/2015  . CAD (coronary artery disease), native coronary artery 09/17/2014  . Eustachian tube dysfunction 10/28/2013  . Hearing loss 10/28/2013  . Obesity (BMI 30.0-34.9) 08/30/2012  . Chest wall pain 08/21/2012  . Drug rash 11/21/2011  . Dyslipidemia 07/26/2011  . Type 2 diabetes mellitus ([place]) 06/23/2011  . ACTINIC KERATOSIS 06/11/2008  . ERECTILE DYSFUNCTION 05/21/2008  . CORONARY ARTERY DISEASE, S/P PTCA 02/06/2008  . Essential hypertension 12/13/2006  . HEMORRHOIDS, NOS 12/13/2006  . BPH 12/13/2006  . OSTEOARTHRITIS, LOWER LEG 12/13/2006  . BACK PAIN, LOW 12/13/2006    Current Outpatient Medications  Medication Sig Dispense Refill  . acetaminophen (TYLENOL) 325 MG tablet Take 650 mg by mouth every 6 (six) hours as needed for mild pain.    [name] amLODipine (NORVASC) 5 MG tablet Take 1 tablet (5 mg total) by mouth daily. 90 tablet 3  . atorvastatin (LIPITOR) 40 MG tablet Take 1 tablet (40 mg total) by mouth daily. 90 tablet 3  . Cyanocobalamin (B-12 COMPLIANCE INJECTION) 1000 MCG/ML KIT Inject as directed every 30 (thirty) days.    . furosemide (LASIX) 20 MG tablet Take 1 tablet (20 mg total) by mouth daily. (Patient taking differently: Take 20 mg by mouth daily as needed for fluid or edema. ) 90 tablet 3  . metFORMIN (GLUCOPHAGE) 1000 MG tablet Take 1 tablet (1,000 mg total) by mouth 2 (two) times daily. 180 tablet 3  . metoprolol tartrate  (LOPRESSOR) 50 MG tablet Take 1 tablet (50 mg total) by mouth 2 (two) times daily. 180 tablet 3  . Multiple Vitamin (MULTI-VITAMIN DAILY PO) Take 1 tablet by mouth daily.     . nitroGLYCERIN (NITROSTAT) 0.4 MG SL tablet DISSOLVE ONE TABLET UNDER THE TONGUE EVERY 5 MINUTES AS NEEDED FOR CHEST PAIN.  DO NOT EXCEED A TOTAL OF 3 DOSES IN 15 MINUTES (Patient taking differently: Place 0.4 mg under the tongue every 5 (five) minutes as needed for chest pain. ) 10 tablet 0  . Omega-3 Fatty Acids (FISH OIL) 1200 MG CAPS Take 1,200 mg by mouth daily.     . silver sulfADIAZINE (SILVADENE) 1 % cream Apply 1 application topically daily. 50 g 0  . warfarin (COUMADIN) 5 MG tablet TAKE 1 TABLET BY MOUTH ONCE DAILY OR AS DIRECTED BY COUMADIN CLINIC 90 tablet 0   No current facility-administered medications for this visit.     Allergies: Lisinopril; Losartan; and Hctz [hydrochlorothiazide]  Past Medical History:  Diagnosis Date  . Constipation   . Diabetes mellitus type 2, noninsulin dependent ([place])       . Dyslipidemia    Hx with intolerance to statin therapy  . Hypertension   . Obesity   . Single vessel coronary disease    s/p stent LAD 2005    Past Surgical History:  Procedure Laterality Date  . BACK SURGERY     2 previous  . BILATERAL KNEE ARTHROSCOPY    . CARDIOVASCULAR STRESS TEST  07-20-2009  EF 73%  . CARDIOVERSION N/A 07/09/2018   Procedure: CARDIOVERSION;  Surgeon: Sanda Klein, MD;  Location: [facility];  Service: Cardiovascular;  Laterality: N/A;  . CORONARY ANGIOPLASTY WITH STENT PLACEMENT  2005  . ESOPHAGOGASTRODUODENOSCOPY N/A 04/09/2018   Procedure: ESOPHAGOGASTRODUODENOSCOPY (EGD);  Surgeon: [name], MD;  Location: [name];  Service: Endoscopy;  Laterality: N/A;  . FINGER SURGERY Right 2018   right 5th finger  . HOT HEMOSTASIS N/A 04/09/2018   Procedure: HOT HEMOSTASIS (ARGON PLASMA COAGULATION/BICAP);  Surgeon: [name], MD;  Location: [name];  Service:  Endoscopy;  Laterality: N/A;  . OTHER SURGICAL HISTORY     Stent in the proximal left anterior descending in 2005    Family History  Problem Relation Age of Onset  . Pulmonary embolism Mother   . Cancer Father     Social History   Tobacco Use  . Smoking status: Former Research scientist (life sciences)  . Smokeless tobacco: Current User    Types: Chew  Substance Use Topics  . Alcohol use: Yes    Alcohol/week: 0.0 standard drinks    Comment: Rarely has a beer    Subjective:    I connected with Colin Newman on 03/11/19 at 10:40 AM EDT by a telephone  and verified that I am speaking with the correct person using two identifiers.   I discussed the limitations of evaluation and management by telemedicine and the availability of in person appointments. The patient expressed understanding and agreed to proceed.  Appointment was originally scheduled as a 6 month follow-up on B12/ Type 2 Diabetes; was recently hospitalized with AKI after getting stuck under his home for 24 hours; notes that he is actually "feeling really good lately" and has no concerns. Has not been feeling dizzy; has home health coming in once a week to check on him. Does not check his blood sugar recently- takes Metformin daily/ Hgba1c in November was 7.0; B12 injections had been stopped for a few months due to [place] but re-started today; needs to get B12 level re-checked as well.      Objective:  There were no vitals filed for this visit.  General: Well developed, well nourished, in no acute distress  Lungs: Respirations unlabored;  Neurologic: Alert and oriented; speech intact; face symmetrical;   Assessment:  1. B12 deficiency   2. Type 2 diabetes mellitus with other specified complication, unspecified whether long term insulin use ([place])     Plan:  Plan to return to the office in 2 weeks for updated labs and repeat B12 injection; continue with home health as scheduled;   No follow-ups on file.  No orders of the defined types were  placed in this encounter.   Requested Prescriptions    No prescriptions requested or ordered in this encounter

## 2019-03-13 DIAGNOSIS — S91002D Unspecified open wound, left ankle, subsequent encounter: Secondary | ICD-10-CM | POA: Diagnosis not present

## 2019-03-13 DIAGNOSIS — E119 Type 2 diabetes mellitus without complications: Secondary | ICD-10-CM | POA: Diagnosis not present

## 2019-03-13 DIAGNOSIS — S31825D Open bite of left buttock, subsequent encounter: Secondary | ICD-10-CM | POA: Diagnosis not present

## 2019-03-13 DIAGNOSIS — I2510 Atherosclerotic heart disease of native coronary artery without angina pectoris: Secondary | ICD-10-CM | POA: Diagnosis not present

## 2019-03-13 DIAGNOSIS — I10 Essential (primary) hypertension: Secondary | ICD-10-CM | POA: Diagnosis not present

## 2019-03-13 DIAGNOSIS — S71001D Unspecified open wound, right hip, subsequent encounter: Secondary | ICD-10-CM | POA: Diagnosis not present

## 2019-03-14 ENCOUNTER — Telehealth: Payer: Self-pay

## 2019-03-14 NOTE — Telephone Encounter (Signed)

## 2019-03-17 ENCOUNTER — Ambulatory Visit (INDEPENDENT_AMBULATORY_CARE_PROVIDER_SITE_OTHER): Payer: Medicare Other | Admitting: *Deleted

## 2019-03-17 ENCOUNTER — Other Ambulatory Visit: Payer: Self-pay

## 2019-03-17 DIAGNOSIS — Z7901 Long term (current) use of anticoagulants: Secondary | ICD-10-CM

## 2019-03-17 DIAGNOSIS — S31825D Open bite of left buttock, subsequent encounter: Secondary | ICD-10-CM | POA: Diagnosis not present

## 2019-03-17 DIAGNOSIS — S71001D Unspecified open wound, right hip, subsequent encounter: Secondary | ICD-10-CM | POA: Diagnosis not present

## 2019-03-17 DIAGNOSIS — S91002D Unspecified open wound, left ankle, subsequent encounter: Secondary | ICD-10-CM | POA: Diagnosis not present

## 2019-03-17 DIAGNOSIS — I10 Essential (primary) hypertension: Secondary | ICD-10-CM | POA: Diagnosis not present

## 2019-03-17 DIAGNOSIS — I4891 Unspecified atrial fibrillation: Secondary | ICD-10-CM | POA: Diagnosis not present

## 2019-03-17 DIAGNOSIS — I2510 Atherosclerotic heart disease of native coronary artery without angina pectoris: Secondary | ICD-10-CM | POA: Diagnosis not present

## 2019-03-17 DIAGNOSIS — E119 Type 2 diabetes mellitus without complications: Secondary | ICD-10-CM | POA: Diagnosis not present

## 2019-03-17 LAB — POCT INR: INR: 3 (ref 2.0–3.0)

## 2019-03-17 NOTE — Patient Instructions (Signed)
Description   Continue taking 1 tablet daily except 1/2 tablet on Sundays. Repeat INR in 7 weeks

## 2019-03-18 DIAGNOSIS — S31825D Open bite of left buttock, subsequent encounter: Secondary | ICD-10-CM | POA: Diagnosis not present

## 2019-03-18 DIAGNOSIS — E119 Type 2 diabetes mellitus without complications: Secondary | ICD-10-CM | POA: Diagnosis not present

## 2019-03-18 DIAGNOSIS — I2510 Atherosclerotic heart disease of native coronary artery without angina pectoris: Secondary | ICD-10-CM | POA: Diagnosis not present

## 2019-03-18 DIAGNOSIS — S71001D Unspecified open wound, right hip, subsequent encounter: Secondary | ICD-10-CM | POA: Diagnosis not present

## 2019-03-18 DIAGNOSIS — I10 Essential (primary) hypertension: Secondary | ICD-10-CM | POA: Diagnosis not present

## 2019-03-18 DIAGNOSIS — S91002D Unspecified open wound, left ankle, subsequent encounter: Secondary | ICD-10-CM | POA: Diagnosis not present

## 2019-03-21 DIAGNOSIS — I10 Essential (primary) hypertension: Secondary | ICD-10-CM | POA: Diagnosis not present

## 2019-03-21 DIAGNOSIS — Z7901 Long term (current) use of anticoagulants: Secondary | ICD-10-CM | POA: Diagnosis not present

## 2019-03-21 DIAGNOSIS — S71001D Unspecified open wound, right hip, subsequent encounter: Secondary | ICD-10-CM | POA: Diagnosis not present

## 2019-03-21 DIAGNOSIS — S91101D Unspecified open wound of right great toe without damage to nail, subsequent encounter: Secondary | ICD-10-CM | POA: Diagnosis not present

## 2019-03-21 DIAGNOSIS — E119 Type 2 diabetes mellitus without complications: Secondary | ICD-10-CM | POA: Diagnosis not present

## 2019-03-21 DIAGNOSIS — S31825D Open bite of left buttock, subsequent encounter: Secondary | ICD-10-CM | POA: Diagnosis not present

## 2019-03-21 DIAGNOSIS — S91002D Unspecified open wound, left ankle, subsequent encounter: Secondary | ICD-10-CM | POA: Diagnosis not present

## 2019-03-21 DIAGNOSIS — I2510 Atherosclerotic heart disease of native coronary artery without angina pectoris: Secondary | ICD-10-CM | POA: Diagnosis not present

## 2019-03-21 DIAGNOSIS — Z7984 Long term (current) use of oral hypoglycemic drugs: Secondary | ICD-10-CM | POA: Diagnosis not present

## 2019-03-25 ENCOUNTER — Ambulatory Visit (INDEPENDENT_AMBULATORY_CARE_PROVIDER_SITE_OTHER): Payer: Medicare Other

## 2019-03-25 ENCOUNTER — Other Ambulatory Visit (INDEPENDENT_AMBULATORY_CARE_PROVIDER_SITE_OTHER): Payer: Medicare Other

## 2019-03-25 ENCOUNTER — Other Ambulatory Visit: Payer: Self-pay

## 2019-03-25 DIAGNOSIS — E538 Deficiency of other specified B group vitamins: Secondary | ICD-10-CM

## 2019-03-25 DIAGNOSIS — E1169 Type 2 diabetes mellitus with other specified complication: Secondary | ICD-10-CM | POA: Diagnosis not present

## 2019-03-25 LAB — CBC WITH DIFFERENTIAL/PLATELET
Basophils Absolute: 0 10*3/uL (ref 0.0–0.1)
Basophils Relative: 0.8 % (ref 0.0–3.0)
Eosinophils Absolute: 0.3 10*3/uL (ref 0.0–0.7)
Eosinophils Relative: 5.3 % — ABNORMAL HIGH (ref 0.0–5.0)
HCT: 31 % — ABNORMAL LOW (ref 39.0–52.0)
Hemoglobin: 10.3 g/dL — ABNORMAL LOW (ref 13.0–17.0)
Lymphocytes Relative: 17.5 % (ref 12.0–46.0)
Lymphs Abs: 1.1 10*3/uL (ref 0.7–4.0)
MCHC: 33.3 g/dL (ref 30.0–36.0)
MCV: 100.9 fl — ABNORMAL HIGH (ref 78.0–100.0)
Monocytes Absolute: 0.7 10*3/uL (ref 0.1–1.0)
Monocytes Relative: 10.7 % (ref 3.0–12.0)
Neutro Abs: 4.1 10*3/uL (ref 1.4–7.7)
Neutrophils Relative %: 65.7 % (ref 43.0–77.0)
Platelets: 230 10*3/uL (ref 150.0–400.0)
RBC: 3.07 Mil/uL — ABNORMAL LOW (ref 4.22–5.81)
RDW: 17.7 % — ABNORMAL HIGH (ref 11.5–15.5)
WBC: 6.3 10*3/uL (ref 4.0–10.5)

## 2019-03-25 LAB — COMPREHENSIVE METABOLIC PANEL
ALT: 16 U/L (ref 0–53)
AST: 14 U/L (ref 0–37)
Albumin: 3.6 g/dL (ref 3.5–5.2)
Alkaline Phosphatase: 113 U/L (ref 39–117)
BUN: 14 mg/dL (ref 6–23)
CO2: 27 mEq/L (ref 19–32)
Calcium: 8.6 mg/dL (ref 8.4–10.5)
Chloride: 105 mEq/L (ref 96–112)
Creatinine, Ser: 1.02 mg/dL (ref 0.40–1.50)
GFR: 69.61 mL/min (ref >60.00)
Glucose, Bld: 128 mg/dL — ABNORMAL HIGH (ref 70–99)
Potassium: 4 mEq/L (ref 3.5–5.1)
Sodium: 142 mEq/L (ref 135–145)
Total Bilirubin: 0.8 mg/dL (ref 0.2–1.2)
Total Protein: 7.1 g/dL (ref 6.0–8.3)

## 2019-03-25 LAB — HEMOGLOBIN A1C: Hgb A1c MFr Bld: 6.7 % — ABNORMAL HIGH (ref 4.6–6.5)

## 2019-03-25 LAB — VITAMIN B12: Vitamin B-12: 449 pg/mL (ref 211–911)

## 2019-03-25 MED ORDER — CYANOCOBALAMIN 1000 MCG/ML IJ SOLN
1000.0000 ug | Freq: Once | INTRAMUSCULAR | Status: AC
  Administered 2019-03-25: 1000 ug via INTRAMUSCULAR

## 2019-03-25 NOTE — Progress Notes (Signed)
Appropriate treatment

## 2019-03-26 DIAGNOSIS — S71001D Unspecified open wound, right hip, subsequent encounter: Secondary | ICD-10-CM | POA: Diagnosis not present

## 2019-03-26 DIAGNOSIS — E119 Type 2 diabetes mellitus without complications: Secondary | ICD-10-CM | POA: Diagnosis not present

## 2019-03-26 DIAGNOSIS — I2510 Atherosclerotic heart disease of native coronary artery without angina pectoris: Secondary | ICD-10-CM | POA: Diagnosis not present

## 2019-03-26 DIAGNOSIS — S31825D Open bite of left buttock, subsequent encounter: Secondary | ICD-10-CM | POA: Diagnosis not present

## 2019-03-26 DIAGNOSIS — S91002D Unspecified open wound, left ankle, subsequent encounter: Secondary | ICD-10-CM | POA: Diagnosis not present

## 2019-03-26 DIAGNOSIS — I10 Essential (primary) hypertension: Secondary | ICD-10-CM | POA: Diagnosis not present

## 2019-03-28 ENCOUNTER — Telehealth: Payer: Self-pay | Admitting: Family

## 2019-03-28 NOTE — Telephone Encounter (Signed)
Copied from [place] [id number]. Topic: Quick Communication - Home Health Verbal Orders >> Mar 28, 2019 12:37 PM Margot Ables wrote: Caller/Agency: Sharyon Cable with [facility] Number: [phone], secure VM Requesting OT/PT/Skilled Nursing/Social Work/Speech Therapy: PT Frequency: Cancelled PT visit this week. Pt is wanting d/c PT. The supervising PT is going to visit the pt next week and hopefully be able to convince pt to continue. No call needed.

## 2019-04-01 ENCOUNTER — Ambulatory Visit: Payer: Self-pay

## 2019-04-01 DIAGNOSIS — E119 Type 2 diabetes mellitus without complications: Secondary | ICD-10-CM | POA: Diagnosis not present

## 2019-04-01 DIAGNOSIS — S91002D Unspecified open wound, left ankle, subsequent encounter: Secondary | ICD-10-CM | POA: Diagnosis not present

## 2019-04-01 DIAGNOSIS — S71001D Unspecified open wound, right hip, subsequent encounter: Secondary | ICD-10-CM | POA: Diagnosis not present

## 2019-04-01 DIAGNOSIS — S31825D Open bite of left buttock, subsequent encounter: Secondary | ICD-10-CM | POA: Diagnosis not present

## 2019-04-01 DIAGNOSIS — I10 Essential (primary) hypertension: Secondary | ICD-10-CM | POA: Diagnosis not present

## 2019-04-01 DIAGNOSIS — I2510 Atherosclerotic heart disease of native coronary artery without angina pectoris: Secondary | ICD-10-CM | POA: Diagnosis not present

## 2019-04-01 NOTE — Telephone Encounter (Signed)
Patient already scheduled for virtual appointment on tomorrow with Mickel Baas.

## 2019-04-01 NOTE — Telephone Encounter (Signed)
  [facility] called to report elevated BP for patient today 190/80 and 180/80. Hr 88, Temp 97.6, RR 17 and O2 98%. Pt denies symptoms. Per Glenard Haring with [facility] pt has taken all his morning medications. He has not taken his lasix. Per Glenard Haring he has no noted edema. Call transferred to office for scheduling advice. Reason for Disposition . [6] Systolic BP  >= 546 OR Diastolic >= 503  AND [5] having NO cardiac or neurologic symptoms  Answer Assessment - Initial Assessment Questions 1. BLOOD PRESSURE: "What is the blood pressure?" "Did you take at least two measurements 5 minutes apart?"     190/80 , 180/80 2. ONSET: "When did you take your blood pressure?"     Just now 3. HOW: "How did you obtain the blood pressure?" (e.g., visiting nurse, automatic home BP monitor)     Visiting nurse [facility] 4. HISTORY: "Do you have a history of high blood pressure?"     yes 5. MEDICATIONS: "Are you taking any medications for blood pressure?" "Have you missed any doses recently?"    No 6. OTHER SYMPTOMS: "Do you have any symptoms?" (e.g., headache, chest pain, blurred vision, difficulty breathing, weakness)     no 7. PREGNANCY: "Is there any chance you are pregnant?" "When was your last menstrual period?"   N/A  Protocols used: HIGH BLOOD PRESSURE-A-AH

## 2019-04-01 NOTE — Telephone Encounter (Signed)
Yes, please make virtual.

## 2019-04-02 ENCOUNTER — Encounter: Payer: Self-pay | Admitting: Family

## 2019-04-02 ENCOUNTER — Other Ambulatory Visit: Payer: Self-pay

## 2019-04-02 ENCOUNTER — Ambulatory Visit (INDEPENDENT_AMBULATORY_CARE_PROVIDER_SITE_OTHER): Payer: Medicare Other | Admitting: Family

## 2019-04-02 VITALS — BP 146/60 | HR 67 | Temp 98.1°F | Ht 71.0 in | Wt 215.1 lb

## 2019-04-02 DIAGNOSIS — I2510 Atherosclerotic heart disease of native coronary artery without angina pectoris: Secondary | ICD-10-CM | POA: Diagnosis not present

## 2019-04-02 DIAGNOSIS — E119 Type 2 diabetes mellitus without complications: Secondary | ICD-10-CM | POA: Diagnosis not present

## 2019-04-02 DIAGNOSIS — I10 Essential (primary) hypertension: Secondary | ICD-10-CM

## 2019-04-02 DIAGNOSIS — S31825D Open bite of left buttock, subsequent encounter: Secondary | ICD-10-CM | POA: Diagnosis not present

## 2019-04-02 DIAGNOSIS — S71001D Unspecified open wound, right hip, subsequent encounter: Secondary | ICD-10-CM | POA: Diagnosis not present

## 2019-04-02 DIAGNOSIS — S91002D Unspecified open wound, left ankle, subsequent encounter: Secondary | ICD-10-CM | POA: Diagnosis not present

## 2019-04-02 NOTE — Progress Notes (Signed)
Colin Newman is a 85 y.o. male with the following history as recorded in EpicCare:  Patient Active Problem List   Diagnosis Date Noted  . Diabetic ulcer of left lower leg ([place]) 02/25/2019  . AKI (acute kidney injury) ([place]) 02/15/2019  . Rhabdomyolysis 02/15/2019  . Atrial fibrillation ([place]) 04/15/2018  . Long term (current) use of anticoagulants 04/15/2018  . Colon polyps 04/12/2017  . Nephropathy 11/10/2016  . Mobitz type 1 second degree atrioventricular block 02/07/2016  . Pre-ulcerative calluses 03/16/2015  . Right foot drop 03/16/2015  . CAD (coronary artery disease), native coronary artery 09/17/2014  . Eustachian tube dysfunction 10/28/2013  . Hearing loss 10/28/2013  . Obesity (BMI 30.0-34.9) 08/30/2012  . Chest wall pain 08/21/2012  . Drug rash 11/21/2011  . Dyslipidemia 07/26/2011  . Type 2 diabetes mellitus ([place]) 06/23/2011  . ACTINIC KERATOSIS 06/11/2008  . ERECTILE DYSFUNCTION 05/21/2008  . CORONARY ARTERY DISEASE, S/P PTCA 02/06/2008  . Essential hypertension 12/13/2006  . HEMORRHOIDS, NOS 12/13/2006  . BPH 12/13/2006  . OSTEOARTHRITIS, LOWER LEG 12/13/2006  . BACK PAIN, LOW 12/13/2006    Current Outpatient Medications  Medication Sig Dispense Refill  . acetaminophen (TYLENOL) 325 MG tablet Take 650 mg by mouth every 6 (six) hours as needed for mild pain.    [name] amLODipine (NORVASC) 5 MG tablet Take 1 tablet (5 mg total) by mouth daily. 90 tablet 3  . atorvastatin (LIPITOR) 40 MG tablet Take 1 tablet (40 mg total) by mouth daily. 90 tablet 3  . Cyanocobalamin (B-12 COMPLIANCE INJECTION) 1000 MCG/ML KIT Inject as directed every 30 (thirty) days.    . furosemide (LASIX) 20 MG tablet Take 1 tablet (20 mg total) by mouth daily. (Patient taking differently: Take 20 mg by mouth daily as needed for fluid or edema. ) 90 tablet 3  . metFORMIN (GLUCOPHAGE) 1000 MG tablet Take 1 tablet (1,000 mg total) by mouth 2 (two) times daily. 180 tablet 3  . metoprolol tartrate  (LOPRESSOR) 50 MG tablet Take 1 tablet (50 mg total) by mouth 2 (two) times daily. 180 tablet 3  . Multiple Vitamin (MULTI-VITAMIN DAILY PO) Take 1 tablet by mouth daily.     . nitroGLYCERIN (NITROSTAT) 0.4 MG SL tablet DISSOLVE ONE TABLET UNDER THE TONGUE EVERY 5 MINUTES AS NEEDED FOR CHEST PAIN.  DO NOT EXCEED A TOTAL OF 3 DOSES IN 15 MINUTES (Patient taking differently: Place 0.4 mg under the tongue every 5 (five) minutes as needed for chest pain. ) 10 tablet 0  . Omega-3 Fatty Acids (FISH OIL) 1200 MG CAPS Take 1,200 mg by mouth daily.     . silver sulfADIAZINE (SILVADENE) 1 % cream Apply 1 application topically daily. 50 g 0  . warfarin (COUMADIN) 5 MG tablet TAKE 1 TABLET BY MOUTH ONCE DAILY OR AS DIRECTED BY COUMADIN CLINIC 90 tablet 0   No current facility-administered medications for this visit.     Allergies: Lisinopril, Losartan, and Hctz [hydrochlorothiazide]  Past Medical History:  Diagnosis Date  . Constipation   . Diabetes mellitus type 2, noninsulin dependent ([place])       . Dyslipidemia    Hx with intolerance to statin therapy  . Hypertension   . Obesity   . Single vessel coronary disease    s/p stent LAD 2005    Past Surgical History:  Procedure Laterality Date  . BACK SURGERY     2 previous  . BILATERAL KNEE ARTHROSCOPY    . CARDIOVASCULAR STRESS TEST  07-20-2009  EF 73%  . CARDIOVERSION N/A 07/09/2018   Procedure: CARDIOVERSION;  Surgeon: Sanda Klein, MD;  Location: [facility];  Service: Cardiovascular;  Laterality: N/A;  . CORONARY ANGIOPLASTY WITH STENT PLACEMENT  2005  . ESOPHAGOGASTRODUODENOSCOPY N/A 04/09/2018   Procedure: ESOPHAGOGASTRODUODENOSCOPY (EGD);  Surgeon: [name], MD;  Location: [name];  Service: Endoscopy;  Laterality: N/A;  . FINGER SURGERY Right 2018   right 5th finger  . HOT HEMOSTASIS N/A 04/09/2018   Procedure: HOT HEMOSTASIS (ARGON PLASMA COAGULATION/BICAP);  Surgeon: [name], MD;  Location: [name];  Service:  Endoscopy;  Laterality: N/A;  . OTHER SURGICAL HISTORY     Stent in the proximal left anterior descending in 2005    Family History  Problem Relation Age of Onset  . Pulmonary embolism Mother   . Cancer Father     Social History   Tobacco Use  . Smoking status: Former Research scientist (life sciences)  . Smokeless tobacco: Current User    Types: Chew  Substance Use Topics  . Alcohol use: Yes    Alcohol/week: 0.0 standard drinks    Comment: Rarely has a beer    Subjective:  Patient's home health care nurse called yesterday with concerns that patient's blood pressure was elevated- 190/80 and 180/80. Notes that when the PT came later in the day the blood pressure had normalized; Does stay in [facility]- under care of cardiology;  Denies any chest pain, shortness of breath, blurred vision or headache. States in general, he is feeling very good;      Objective:  Vitals:   04/02/19 1126  BP: (!) 146/60  Pulse: 67  Temp: 98.1 F (36.7 C)  TempSrc: Oral  SpO2: 96%  Weight: 215 lb 1.9 oz (97.6 kg)  Height: _0  (1.803 m)    General: Well developed, well nourished, in no acute distress  Skin : Warm and dry.  Head: Normocephalic and atraumatic  Eyes: Sclera and conjunctiva clear; pupils round and reactive to light; extraocular movements intact  Ears: External normal; canals clear; tympanic membranes normal  Oropharynx: Pink, supple. No suspicious lesions  Neck: Supple without thyromegaly, adenopathy  Lungs: Respirations unlabored; clear to auscultation bilaterally without wheeze, rales, rhonchi  CVS exam: irregularly irregular rhythm   Abdomen: Soft; nontender; nondistended; normoactive bowel sounds; no masses or hepatosplenomegaly  Musculoskeletal: No deformities; no active joint inflammation  Extremities: No edema, cyanosis, clubbing  Vessels: Symmetric bilaterally  Neurologic: Alert and oriented; speech intact; face symmetrical; moves all extremities well; CNII-XII intact without focal deficit    Assessment:  1. Essential hypertension     Plan:  Stable; ? If patient had stress reaction at his home yesterday- he notes he is very frustrated with home health and PT- is ready to have people out of his home; EKG shows  no acute changes; continue same medications; follow-up as needed otherwise.   No follow-ups on file.  No orders of the defined types were placed in this encounter.   Requested Prescriptions    No prescriptions requested or ordered in this encounter

## 2019-04-09 ENCOUNTER — Telehealth: Payer: Self-pay | Admitting: Family

## 2019-04-09 NOTE — Telephone Encounter (Signed)
Kiona with [facility] called and wants to let pcp know that patient refuse home health service for today. Her call back number is [phone].

## 2019-04-10 ENCOUNTER — Ambulatory Visit (INDEPENDENT_AMBULATORY_CARE_PROVIDER_SITE_OTHER): Payer: Medicare Other

## 2019-04-10 ENCOUNTER — Other Ambulatory Visit: Payer: Self-pay

## 2019-04-10 DIAGNOSIS — E538 Deficiency of other specified B group vitamins: Secondary | ICD-10-CM | POA: Diagnosis not present

## 2019-04-10 MED ORDER — CYANOCOBALAMIN 1000 MCG/ML IJ SOLN
1000.0000 ug | Freq: Once | INTRAMUSCULAR | Status: AC
  Administered 2019-04-10: 1000 ug via INTRAMUSCULAR

## 2019-04-10 NOTE — Progress Notes (Signed)
b12 Injection given.   Stacy J Burns, MD  

## 2019-04-21 ENCOUNTER — Encounter: Payer: Self-pay | Admitting: Family

## 2019-04-21 ENCOUNTER — Ambulatory Visit (INDEPENDENT_AMBULATORY_CARE_PROVIDER_SITE_OTHER): Payer: Medicare Other | Admitting: Family

## 2019-04-21 ENCOUNTER — Other Ambulatory Visit: Payer: Self-pay

## 2019-04-21 VITALS — BP 144/70 | HR 83 | Temp 98.3°F | Ht 71.0 in | Wt 214.1 lb

## 2019-04-21 DIAGNOSIS — S81802A Unspecified open wound, left lower leg, initial encounter: Secondary | ICD-10-CM | POA: Diagnosis not present

## 2019-04-21 DIAGNOSIS — L03116 Cellulitis of left lower limb: Secondary | ICD-10-CM | POA: Diagnosis not present

## 2019-04-21 MED ORDER — CEPHALEXIN 500 MG PO CAPS: 500.0000 mg | ORAL_CAPSULE | Freq: Three times a day (TID) | ORAL | 0 refills | Status: AC

## 2019-04-21 NOTE — Progress Notes (Signed)
Colin Newman is a 85 y.o. male with the following history as recorded in EpicCare:  Patient Active Problem List   Diagnosis Date Noted  . Diabetic ulcer of left lower leg ([place]) 02/25/2019  . AKI (acute kidney injury) ([place]) 02/15/2019  . Rhabdomyolysis 02/15/2019  . Atrial fibrillation ([place]) 04/15/2018  . Long term (current) use of anticoagulants 04/15/2018  . Colon polyps 04/12/2017  . Nephropathy 11/10/2016  . Mobitz type 1 second degree atrioventricular block 02/07/2016  . Pre-ulcerative calluses 03/16/2015  . Right foot drop 03/16/2015  . CAD (coronary artery disease), native coronary artery 09/17/2014  . Eustachian tube dysfunction 10/28/2013  . Hearing loss 10/28/2013  . Obesity (BMI 30.0-34.9) 08/30/2012  . Chest wall pain 08/21/2012  . Drug rash 11/21/2011  . Dyslipidemia 07/26/2011  . Type 2 diabetes mellitus ([place]) 06/23/2011  . ACTINIC KERATOSIS 06/11/2008  . ERECTILE DYSFUNCTION 05/21/2008  . CORONARY ARTERY DISEASE, S/P PTCA 02/06/2008  . Essential hypertension 12/13/2006  . HEMORRHOIDS, NOS 12/13/2006  . BPH 12/13/2006  . OSTEOARTHRITIS, LOWER LEG 12/13/2006  . BACK PAIN, LOW 12/13/2006    Current Outpatient Medications  Medication Sig Dispense Refill  . acetaminophen (TYLENOL) 325 MG tablet Take 650 mg by mouth every 6 (six) hours as needed for mild pain.    [name] amLODipine (NORVASC) 5 MG tablet Take 1 tablet (5 mg total) by mouth daily. 90 tablet 3  . atorvastatin (LIPITOR) 40 MG tablet Take 1 tablet (40 mg total) by mouth daily. 90 tablet 3  . Cyanocobalamin (B-12 COMPLIANCE INJECTION) 1000 MCG/ML KIT Inject as directed every 30 (thirty) days.    . furosemide (LASIX) 20 MG tablet Take 1 tablet (20 mg total) by mouth daily. (Patient taking differently: Take 20 mg by mouth daily as needed for fluid or edema. ) 90 tablet 3  . metFORMIN (GLUCOPHAGE) 1000 MG tablet Take 1 tablet (1,000 mg total) by mouth 2 (two) times daily. 180 tablet 3  . metoprolol tartrate  (LOPRESSOR) 50 MG tablet Take 1 tablet (50 mg total) by mouth 2 (two) times daily. 180 tablet 3  . Multiple Vitamin (MULTI-VITAMIN DAILY PO) Take 1 tablet by mouth daily.     . nitroGLYCERIN (NITROSTAT) 0.4 MG SL tablet DISSOLVE ONE TABLET UNDER THE TONGUE EVERY 5 MINUTES AS NEEDED FOR CHEST PAIN.  DO NOT EXCEED A TOTAL OF 3 DOSES IN 15 MINUTES (Patient taking differently: Place 0.4 mg under the tongue every 5 (five) minutes as needed for chest pain. ) 10 tablet 0  . Omega-3 Fatty Acids (FISH OIL) 1200 MG CAPS Take 1,200 mg by mouth daily.     . silver sulfADIAZINE (SILVADENE) 1 % cream Apply 1 application topically daily. 50 g 0  . warfarin (COUMADIN) 5 MG tablet TAKE 1 TABLET BY MOUTH ONCE DAILY OR AS DIRECTED BY COUMADIN CLINIC 90 tablet 0  . cephALEXin (KEFLEX) 500 MG capsule Take 1 capsule (500 mg total) by mouth 3 (three) times daily. 30 capsule 0   No current facility-administered medications for this visit.     Allergies: Lisinopril, Losartan, and Hctz [hydrochlorothiazide]  Past Medical History:  Diagnosis Date  . Constipation   . Diabetes mellitus type 2, noninsulin dependent ([place])       . Dyslipidemia    Hx with intolerance to statin therapy  . Hypertension   . Obesity   . Single vessel coronary disease    s/p stent LAD 2005    Past Surgical History:  Procedure Laterality Date  . BACK  SURGERY     2 previous  . BILATERAL KNEE ARTHROSCOPY    . CARDIOVASCULAR STRESS TEST  07-20-2009   EF 73%  . CARDIOVERSION N/A 07/09/2018   Procedure: CARDIOVERSION;  Surgeon: Sanda Klein, MD;  Location: [facility];  Service: Cardiovascular;  Laterality: N/A;  . CORONARY ANGIOPLASTY WITH STENT PLACEMENT  2005  . ESOPHAGOGASTRODUODENOSCOPY N/A 04/09/2018   Procedure: ESOPHAGOGASTRODUODENOSCOPY (EGD);  Surgeon: [name], MD;  Location: [name];  Service: Endoscopy;  Laterality: N/A;  . FINGER SURGERY Right 2018   right 5th finger  . HOT HEMOSTASIS N/A 04/09/2018   Procedure:  HOT HEMOSTASIS (ARGON PLASMA COAGULATION/BICAP);  Surgeon: [name], MD;  Location: [name];  Service: Endoscopy;  Laterality: N/A;  . OTHER SURGICAL HISTORY     Stent in the proximal left anterior descending in 2005    Family History  Problem Relation Age of Onset  . Pulmonary embolism Mother   . Cancer Father     Social History   Tobacco Use  . Smoking status: Former Research scientist (life sciences)  . Smokeless tobacco: Current User    Types: Chew  Substance Use Topics  . Alcohol use: Yes    Alcohol/week: 0.0 standard drinks    Comment: Rarely has a beer    Subjective:  Patient presents with concerns for non-healing wound over his left 5th toe; notes area of concern was first noted by a home health nurse but he has never discussed with me; denies any sudden changes in appearance of the foot but states his family asked to come have the area evaluated; does have Type 2 Diabetes- very well controlled;      Objective:  Vitals:   04/21/19 1412  BP: (!) 144/70  Pulse: 83  Temp: 98.3 F (36.8 C)  TempSrc: Oral  SpO2: 97%  Weight: 214 lb 1.3 oz (97.1 kg)  Height: 5' 11"  (1.803 m)    General: Well developed, well nourished, in no acute distress  Skin : Warm and dry.  Head: Normocephalic and atraumatic  Lungs: Respirations unlabored; clear to auscultation bilaterally without wheeze, rales, rhonchi  Musculoskeletal: No deformities; no active joint inflammation  Extremities: No edema, cyanosis, clubbing; swelling, erythema noted over 5th toe left foot Vessels: Symmetric bilaterally  Neurologic: Alert and oriented; speech intact; face symmetrical; moves all extremities well; CNII-XII intact without focal deficit   Assessment:  1. Non-healing wound of lower extremity, left, initial encounter   2. Cellulitis of left lower extremity     Plan:  Rx for Keflex 500 mg tid x 10 days; refer to vein and vascular specialist to discuss treatment options.   No follow-ups on file.  Orders Placed This  Encounter  Procedures  . Ambulatory referral to Vascular Surgery    Referral Priority:   Routine    Referral Type:   Surgical    Referral Reason:   Specialty Services Required    Requested Specialty:   Vascular Surgery    Number of Visits Requested:   1    Requested Prescriptions   Signed Prescriptions Disp Refills  . cephALEXin (KEFLEX) 500 MG capsule 30 capsule 0    Sig: Take 1 capsule (500 mg total) by mouth 3 (three) times daily.

## 2019-05-01 ENCOUNTER — Telehealth: Payer: Self-pay

## 2019-05-01 NOTE — Telephone Encounter (Signed)

## 2019-05-05 ENCOUNTER — Ambulatory Visit (INDEPENDENT_AMBULATORY_CARE_PROVIDER_SITE_OTHER): Payer: Medicare Other | Admitting: Pharmacist

## 2019-05-05 ENCOUNTER — Other Ambulatory Visit: Payer: Self-pay

## 2019-05-05 DIAGNOSIS — Z7901 Long term (current) use of anticoagulants: Secondary | ICD-10-CM

## 2019-05-05 DIAGNOSIS — I4891 Unspecified atrial fibrillation: Secondary | ICD-10-CM | POA: Diagnosis not present

## 2019-05-05 LAB — POCT INR: INR: 2.2 (ref 2.0–3.0)

## 2019-05-29 DIAGNOSIS — E113393 Type 2 diabetes mellitus with moderate nonproliferative diabetic retinopathy without macular edema, bilateral: Secondary | ICD-10-CM | POA: Diagnosis not present

## 2019-05-30 ENCOUNTER — Other Ambulatory Visit: Payer: Self-pay | Admitting: Cardiology

## 2019-06-30 ENCOUNTER — Ambulatory Visit (INDEPENDENT_AMBULATORY_CARE_PROVIDER_SITE_OTHER): Payer: Medicare Other | Admitting: Pharmacist

## 2019-06-30 ENCOUNTER — Other Ambulatory Visit: Payer: Self-pay

## 2019-06-30 DIAGNOSIS — I482 Chronic atrial fibrillation, unspecified: Secondary | ICD-10-CM | POA: Diagnosis not present

## 2019-06-30 DIAGNOSIS — I4891 Unspecified atrial fibrillation: Secondary | ICD-10-CM | POA: Diagnosis not present

## 2019-06-30 DIAGNOSIS — Z7901 Long term (current) use of anticoagulants: Secondary | ICD-10-CM

## 2019-06-30 LAB — POCT INR: INR: 2.1 (ref 2.0–3.0)

## 2019-08-01 ENCOUNTER — Telehealth: Payer: Self-pay

## 2019-08-01 NOTE — Telephone Encounter (Signed)
This is a Colin Newman pt

## 2019-08-01 NOTE — Telephone Encounter (Signed)
I think Mickel Baas can sign this.

## 2019-08-01 NOTE — Telephone Encounter (Signed)
Copied from [place] [phone]. Topic: General - Other >> 08/20/19 12:07 PM Leward Quan A wrote: Reason for CRM: FYI: [facility] called needing Dr Quay Burow to sign off on the patients death certificate which will be brought over by the funeral home.

## 2019-08-04 ENCOUNTER — Telehealth: Payer: Self-pay | Admitting: Internal Medicine

## 2019-08-04 NOTE — Telephone Encounter (Signed)
Received DC from [facility]. Sent to [facility] for Dr. Quay Burow to Sign.

## 2019-08-04 NOTE — Telephone Encounter (Signed)
Received signed Death Cert. [place] and made aware it is ready to pick up.

## 2019-08-17 DEATH — deceased

## 2021-02-08 IMAGING — CT CT CERVICAL SPINE WITHOUT CONTRAST
4 of 8 series · 12 of 33 positions shown, 13 images · non-contrast
Comparison: None.

CLINICAL DATA: Fall.

EXAM:
CT HEAD WITHOUT CONTRAST
CT CERVICAL SPINE WITHOUT CONTRAST
TECHNIQUE: Multidetector CT imaging of the head and cervical spine was
performed following the standard protocol without intravenous
contrast. Multiplanar CT image reconstructions of the cervical spine
were also generated.

[Series 9: c_spine 2.0 st · axial · 0.33mm/px · z∈[+1252,+1344]mm · 3 of 93 slices shown, 4 images]
[im 24/93  soft-tissue]
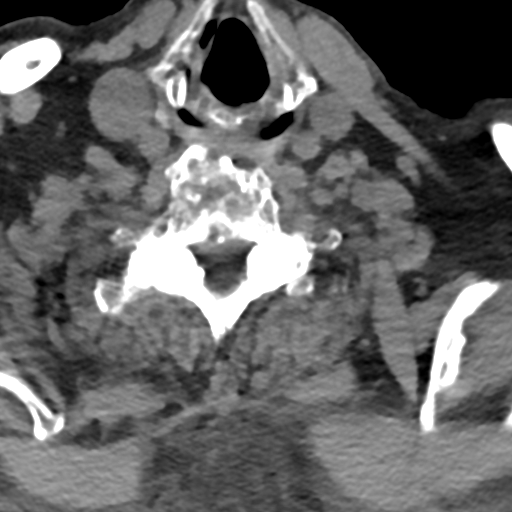
[im 24/93  bone]
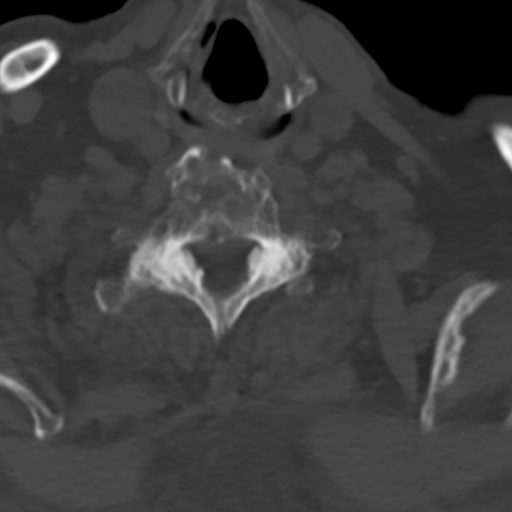
[im 47/93  bone]
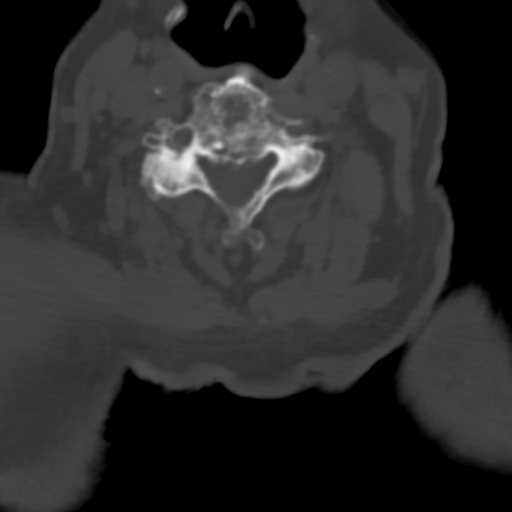
[im 70/93  bone]
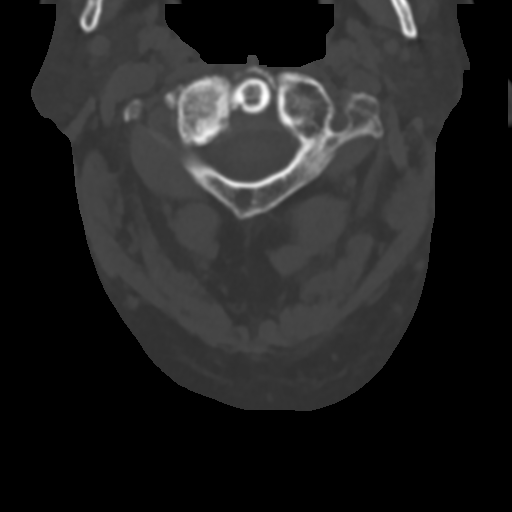

[Series 10: coronal bone · coronal · 0.23mm/px · 1 of 61 slices shown]
[im 31/61  bone]
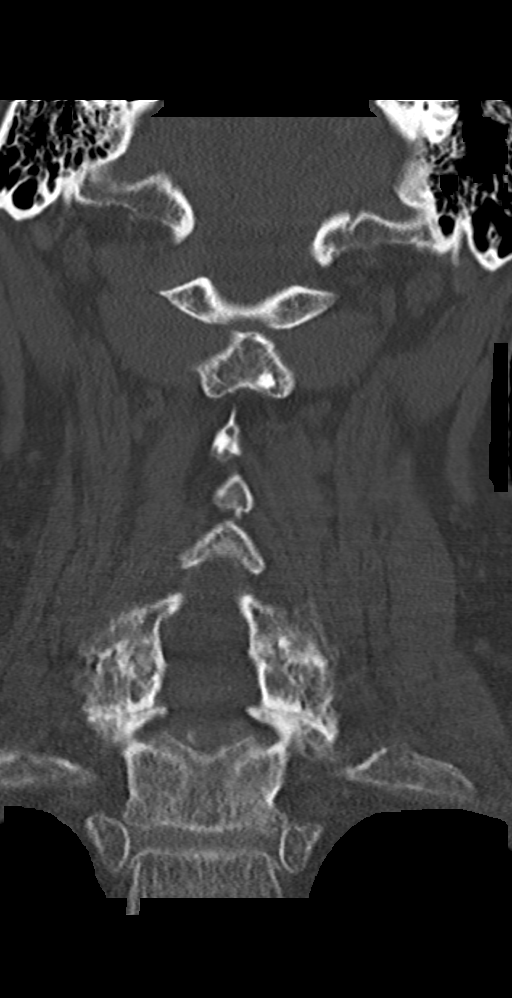

[Series 11: sagittal bone · sagittal · 0.31mm/px · 5 of 61 slices shown]
[im 11/61  bone]
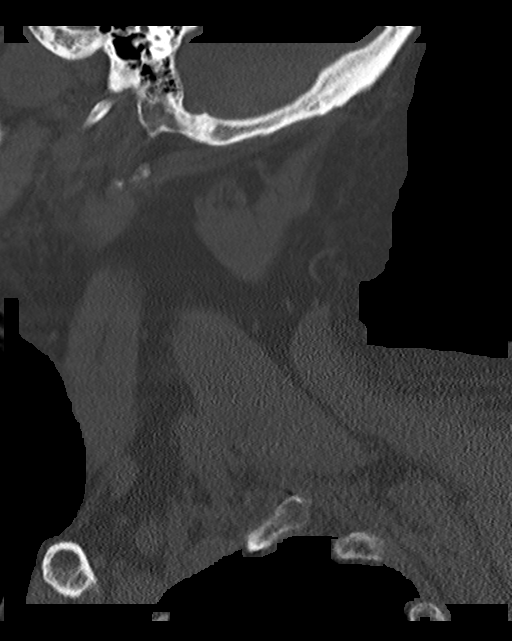
[im 21/61  bone]
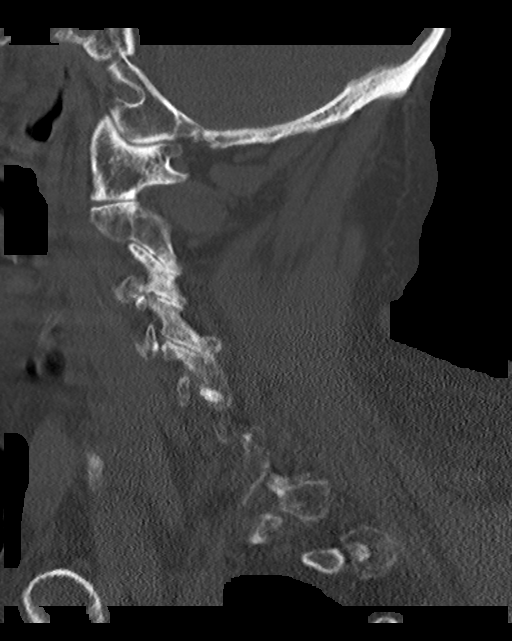
[im 31/61  bone]
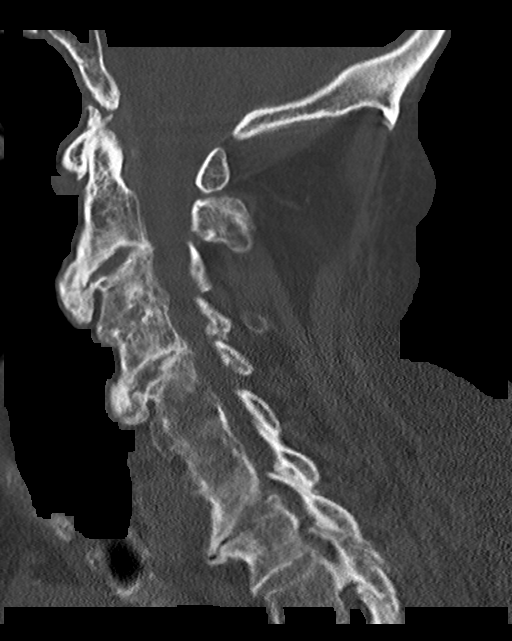
[im 41/61  bone]
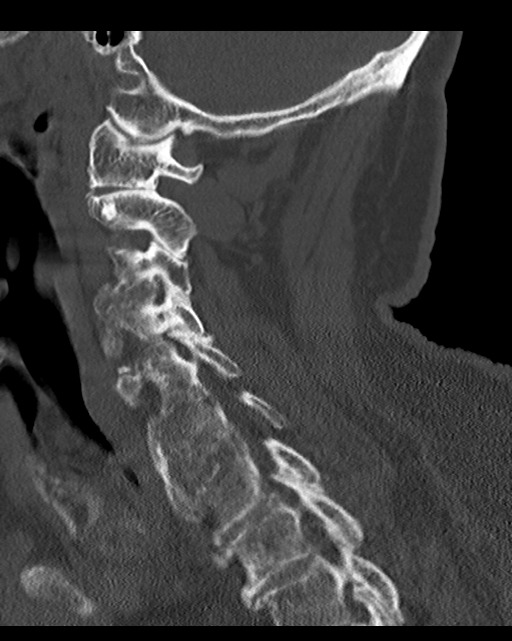
[im 51/61  bone]
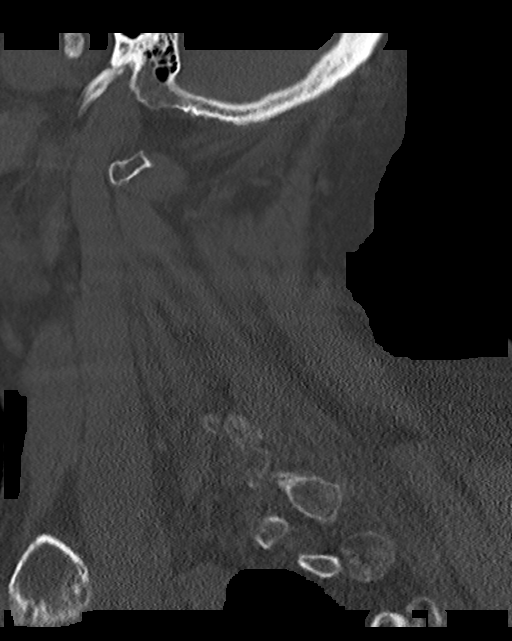

[Series 16: orthogonal axial st · axial · 0.21mm/px · z∈[+1232,+1321]mm · 3 of 96 slices shown]
[im 24/96  bone]
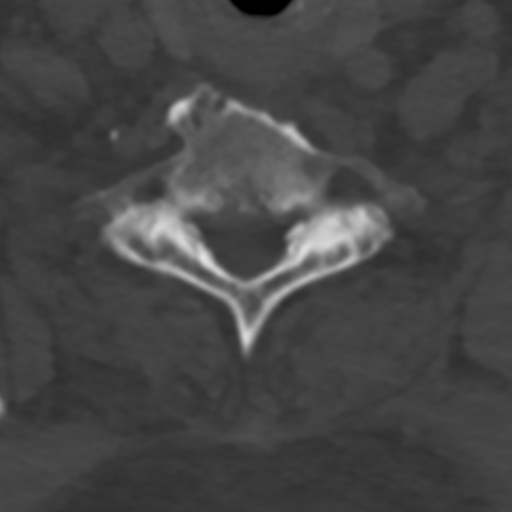
[im 48/96  bone]
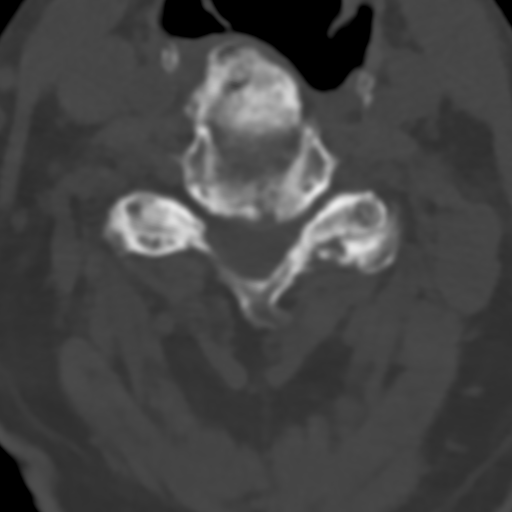
[im 72/96  bone]
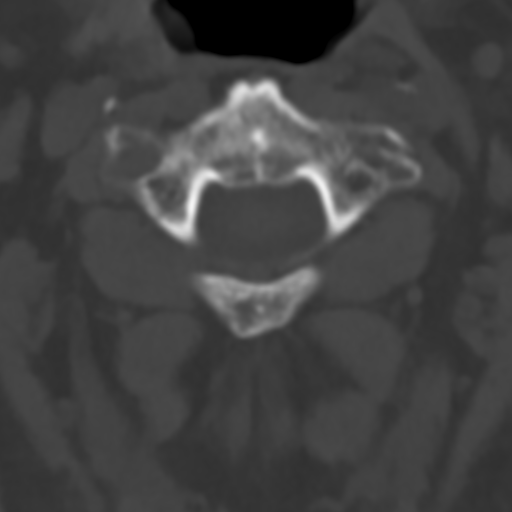

[12 of 33 positions shown; findings below may reference images not displayed]

FINDINGS: CT HEAD FINDINGS

Brain: No evidence of acute infarction, hemorrhage, hydrocephalus,
extra-axial collection or mass lesion/mass effect. Mild chronic
small vessel ischemic change in the periventricular white matter.

Vascular: Atherosclerotic calcification.

Skull: No acute or aggressive finding.

Sinuses/Orbits: Bilateral cataract resection.

CT CERVICAL SPINE FINDINGS

Alignment: Straightening of the cervical spine

Skull base and vertebrae: Negative for acute fracture

Soft tissues and spinal canal: No prevertebral fluid or swelling. No
visible canal hematoma.

Disc levels: Extensive spondylosis with multi-level ankylosis. There
is generalized degenerative facet spurring. Notable advanced
biforaminal impingement at C3-4 and C4-5.

Upper chest: Negative
IMPRESSION: 1. No acute intracranial finding.
2. Negative for cervical spine fracture.
3. Advanced spondylosis with multi-level ankylosis.

## 2021-02-08 IMAGING — CR DG HIP (WITH OR WITHOUT PELVIS) 2V BILAT
5 series · 5 of 5 positions shown · non-contrast
Comparison: None.

CLINICAL DATA: Bilateral hip pain

EXAM:
DG HIP (WITH OR WITHOUT PELVIS) 2V BILAT

[hip ap (1 of 2)]
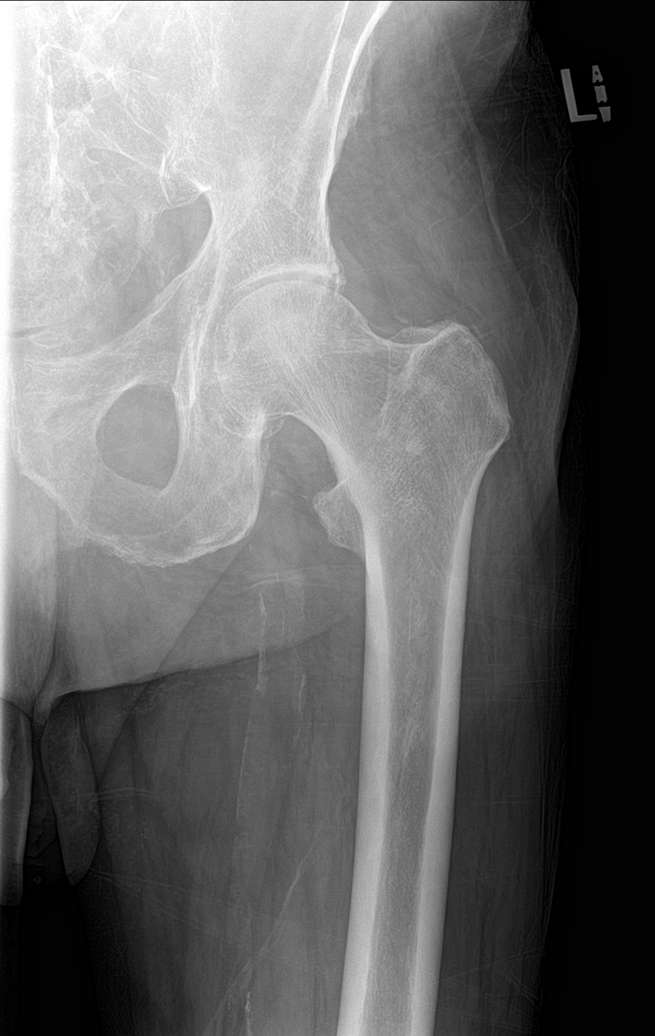

[hip lat (1 of 2)]
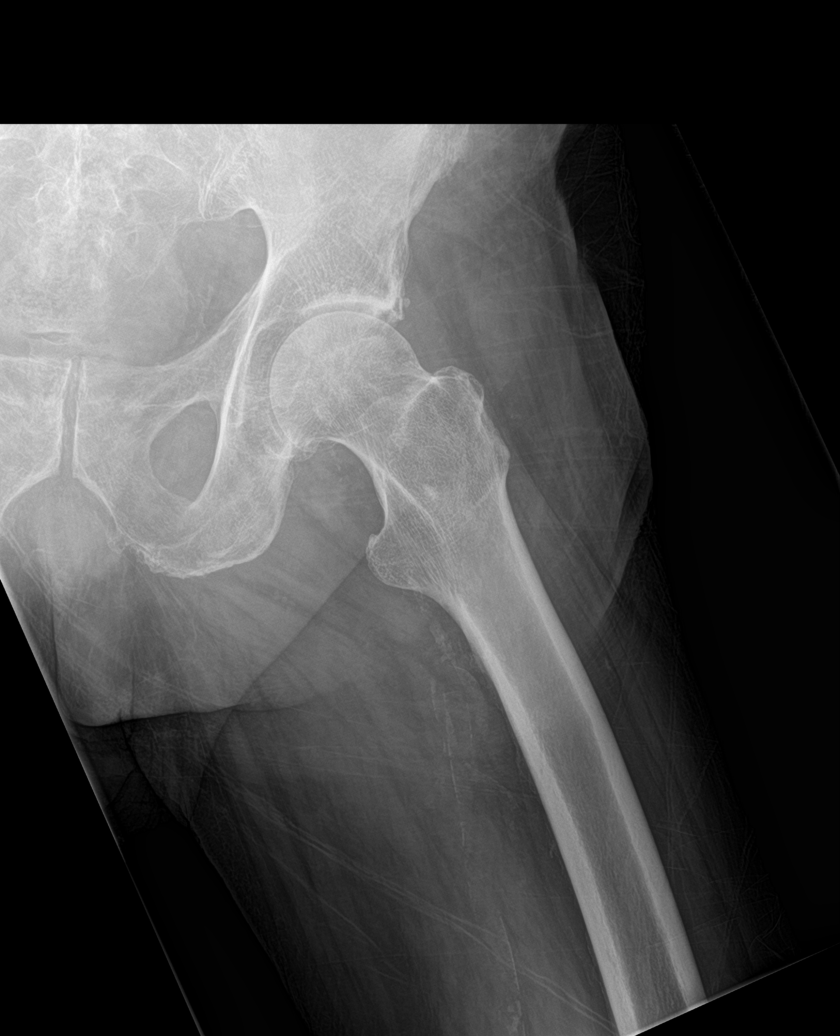

[hip ap (2 of 2)]
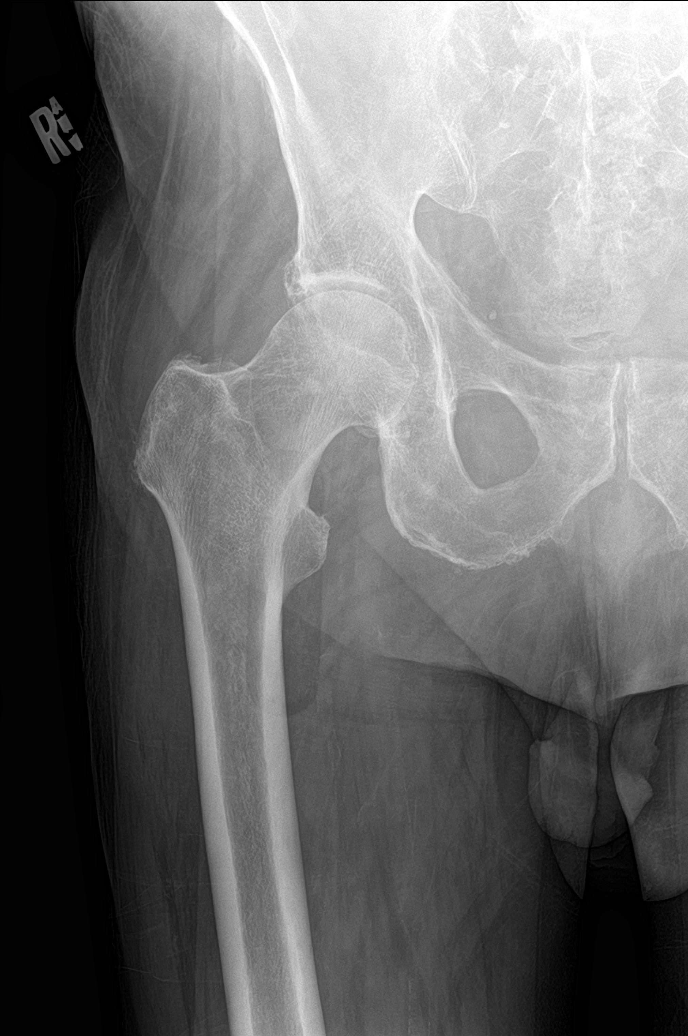

[hip lat (2 of 2)]
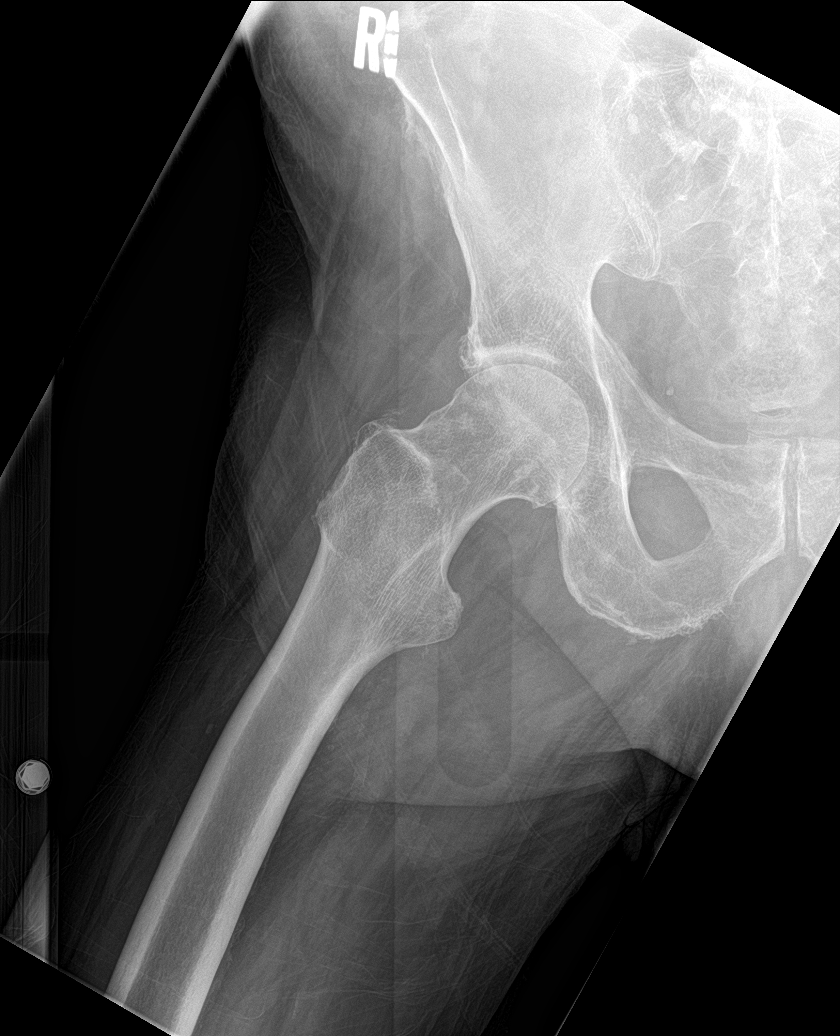

[pelvis ap]
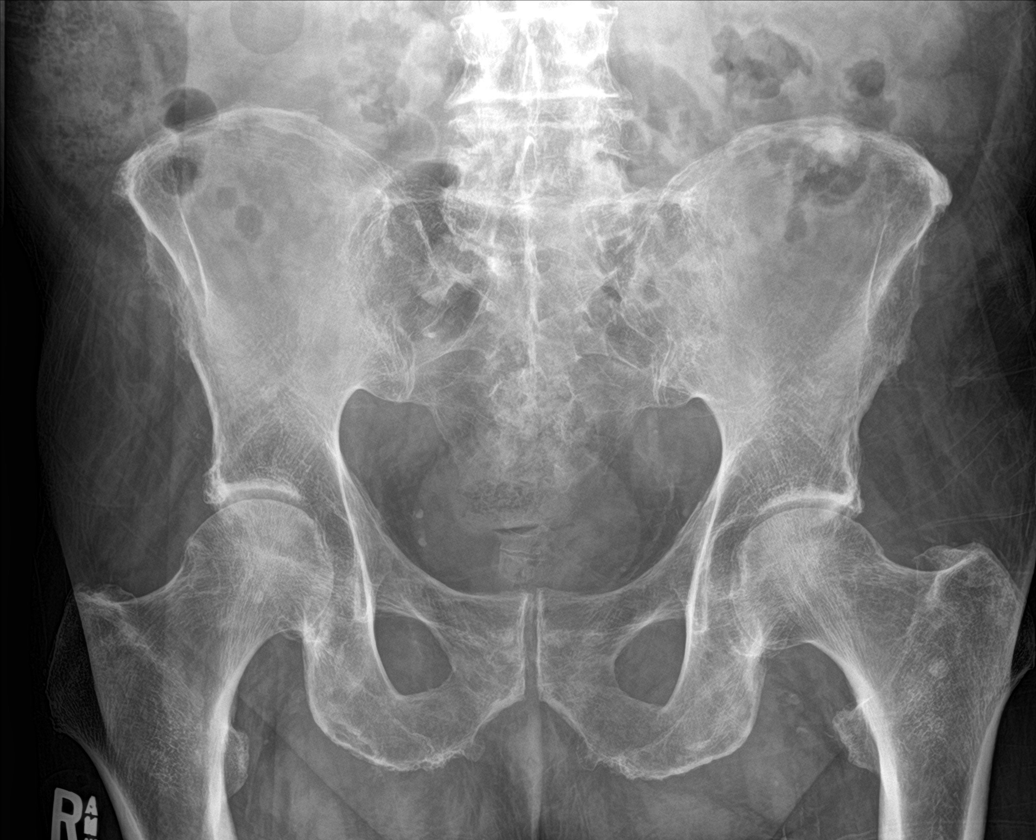

[5 of 5 positions shown; findings below may reference images not displayed]

FINDINGS: Generalized osteopenia. No acute fracture or dislocation. Mild
osteoarthritis of bilateral hips. Lower lumbar spine spondylosis.
Osteoarthritis of bilateral SI joints. Peripheral vascular
atherosclerotic disease.
IMPRESSION: No acute osseous injury of the bilateral hips.
